# Patient Record
Sex: Female | Born: 1940 | Race: Black or African American | Hispanic: No | Marital: Single | Smoking: Former smoker
Health system: Southern US, Community
[De-identification: ages and names within clinical notes are randomized; demographics above are authoritative.]

## PROBLEM LIST (undated history)

## (undated) DIAGNOSIS — I340 Nonrheumatic mitral (valve) insufficiency: Secondary | ICD-10-CM

## (undated) DIAGNOSIS — D649 Anemia, unspecified: Secondary | ICD-10-CM

## (undated) DIAGNOSIS — I00 Rheumatic fever without heart involvement: Secondary | ICD-10-CM

## (undated) DIAGNOSIS — I099 Rheumatic heart disease, unspecified: Secondary | ICD-10-CM

## (undated) DIAGNOSIS — R001 Bradycardia, unspecified: Secondary | ICD-10-CM

## (undated) DIAGNOSIS — I10 Essential (primary) hypertension: Secondary | ICD-10-CM

## (undated) DIAGNOSIS — Z9289 Personal history of other medical treatment: Secondary | ICD-10-CM

## (undated) DIAGNOSIS — M069 Rheumatoid arthritis, unspecified: Secondary | ICD-10-CM

## (undated) DIAGNOSIS — I351 Nonrheumatic aortic (valve) insufficiency: Secondary | ICD-10-CM

## (undated) HISTORY — DX: Nonrheumatic mitral (valve) insufficiency: I34.0

## (undated) HISTORY — PX: CHOLECYSTECTOMY: SHX55

## (undated) HISTORY — DX: Personal history of other medical treatment: Z92.89

## (undated) HISTORY — DX: Essential (primary) hypertension: I10

## (undated) HISTORY — PX: TONSILLECTOMY: SUR1361

## (undated) HISTORY — PX: ABDOMINAL HYSTERECTOMY: SHX81

## (undated) HISTORY — DX: Nonrheumatic aortic (valve) insufficiency: I35.1

## (undated) HISTORY — PX: COLON SURGERY: SHX602

## (undated) HISTORY — PX: EXCISIONAL HEMORRHOIDECTOMY: SHX1541

---

## 1993-04-07 HISTORY — PX: HEMI-MICRODISCECTOMY LUMBAR LAMINECTOMY LEVEL 4: SHX5849

## 2008-11-05 DIAGNOSIS — Z9289 Personal history of other medical treatment: Secondary | ICD-10-CM

## 2008-11-05 HISTORY — DX: Personal history of other medical treatment: Z92.89

## 2009-06-01 ENCOUNTER — Emergency Department: Payer: Self-pay | Admitting: Emergency Medicine

## 2009-06-11 ENCOUNTER — Ambulatory Visit: Payer: Self-pay

## 2009-07-04 ENCOUNTER — Inpatient Hospital Stay: Payer: Self-pay | Admitting: Internal Medicine

## 2009-09-17 ENCOUNTER — Ambulatory Visit: Payer: Self-pay | Admitting: Family Medicine

## 2009-09-26 ENCOUNTER — Ambulatory Visit: Payer: Self-pay | Admitting: Family Medicine

## 2009-12-11 ENCOUNTER — Ambulatory Visit: Payer: Self-pay | Admitting: Specialist

## 2010-03-21 ENCOUNTER — Ambulatory Visit: Payer: Self-pay | Admitting: Family Medicine

## 2010-05-18 ENCOUNTER — Emergency Department: Payer: Self-pay | Admitting: Internal Medicine

## 2010-05-27 ENCOUNTER — Emergency Department: Payer: Self-pay | Admitting: Emergency Medicine

## 2010-06-14 ENCOUNTER — Ambulatory Visit: Payer: Self-pay | Admitting: Specialist

## 2010-09-27 ENCOUNTER — Ambulatory Visit: Payer: Self-pay | Admitting: Family Medicine

## 2010-12-25 ENCOUNTER — Ambulatory Visit: Payer: Self-pay | Admitting: Specialist

## 2011-04-16 DIAGNOSIS — H903 Sensorineural hearing loss, bilateral: Secondary | ICD-10-CM | POA: Diagnosis not present

## 2011-04-17 ENCOUNTER — Ambulatory Visit: Payer: Self-pay | Admitting: Family Medicine

## 2011-04-17 DIAGNOSIS — N959 Unspecified menopausal and perimenopausal disorder: Secondary | ICD-10-CM | POA: Diagnosis not present

## 2011-04-17 DIAGNOSIS — N951 Menopausal and female climacteric states: Secondary | ICD-10-CM | POA: Diagnosis not present

## 2011-05-06 DIAGNOSIS — I10 Essential (primary) hypertension: Secondary | ICD-10-CM | POA: Diagnosis not present

## 2011-05-06 DIAGNOSIS — H35039 Hypertensive retinopathy, unspecified eye: Secondary | ICD-10-CM | POA: Diagnosis not present

## 2011-06-06 ENCOUNTER — Ambulatory Visit: Payer: Self-pay | Admitting: Specialist

## 2011-06-06 DIAGNOSIS — J984 Other disorders of lung: Secondary | ICD-10-CM | POA: Diagnosis not present

## 2011-06-06 DIAGNOSIS — R918 Other nonspecific abnormal finding of lung field: Secondary | ICD-10-CM | POA: Diagnosis not present

## 2011-06-13 DIAGNOSIS — R911 Solitary pulmonary nodule: Secondary | ICD-10-CM | POA: Diagnosis not present

## 2011-06-16 DIAGNOSIS — I10 Essential (primary) hypertension: Secondary | ICD-10-CM | POA: Diagnosis not present

## 2011-07-15 DIAGNOSIS — L089 Local infection of the skin and subcutaneous tissue, unspecified: Secondary | ICD-10-CM | POA: Diagnosis not present

## 2011-08-19 DIAGNOSIS — M722 Plantar fascial fibromatosis: Secondary | ICD-10-CM | POA: Diagnosis not present

## 2011-08-19 DIAGNOSIS — B351 Tinea unguium: Secondary | ICD-10-CM | POA: Diagnosis not present

## 2011-08-19 DIAGNOSIS — M79609 Pain in unspecified limb: Secondary | ICD-10-CM | POA: Diagnosis not present

## 2011-08-20 DIAGNOSIS — H04129 Dry eye syndrome of unspecified lacrimal gland: Secondary | ICD-10-CM | POA: Diagnosis not present

## 2011-09-16 DIAGNOSIS — I10 Essential (primary) hypertension: Secondary | ICD-10-CM | POA: Diagnosis not present

## 2011-09-29 DIAGNOSIS — E119 Type 2 diabetes mellitus without complications: Secondary | ICD-10-CM | POA: Diagnosis not present

## 2011-09-30 ENCOUNTER — Ambulatory Visit: Payer: Self-pay | Admitting: Family Medicine

## 2011-09-30 DIAGNOSIS — Z1231 Encounter for screening mammogram for malignant neoplasm of breast: Secondary | ICD-10-CM | POA: Diagnosis not present

## 2011-10-14 DIAGNOSIS — M6281 Muscle weakness (generalized): Secondary | ICD-10-CM | POA: Diagnosis not present

## 2011-10-14 DIAGNOSIS — R279 Unspecified lack of coordination: Secondary | ICD-10-CM | POA: Diagnosis not present

## 2011-10-14 DIAGNOSIS — M546 Pain in thoracic spine: Secondary | ICD-10-CM | POA: Diagnosis not present

## 2011-10-14 DIAGNOSIS — M545 Low back pain: Secondary | ICD-10-CM | POA: Diagnosis not present

## 2011-10-14 DIAGNOSIS — M48061 Spinal stenosis, lumbar region without neurogenic claudication: Secondary | ICD-10-CM | POA: Diagnosis not present

## 2011-10-16 DIAGNOSIS — M545 Low back pain: Secondary | ICD-10-CM | POA: Diagnosis not present

## 2011-10-16 DIAGNOSIS — M546 Pain in thoracic spine: Secondary | ICD-10-CM | POA: Diagnosis not present

## 2011-10-16 DIAGNOSIS — M48061 Spinal stenosis, lumbar region without neurogenic claudication: Secondary | ICD-10-CM | POA: Diagnosis not present

## 2011-10-16 DIAGNOSIS — M6281 Muscle weakness (generalized): Secondary | ICD-10-CM | POA: Diagnosis not present

## 2011-10-16 DIAGNOSIS — R279 Unspecified lack of coordination: Secondary | ICD-10-CM | POA: Diagnosis not present

## 2011-10-17 DIAGNOSIS — M545 Low back pain: Secondary | ICD-10-CM | POA: Diagnosis not present

## 2011-10-17 DIAGNOSIS — M6281 Muscle weakness (generalized): Secondary | ICD-10-CM | POA: Diagnosis not present

## 2011-10-17 DIAGNOSIS — R279 Unspecified lack of coordination: Secondary | ICD-10-CM | POA: Diagnosis not present

## 2011-10-17 DIAGNOSIS — M48061 Spinal stenosis, lumbar region without neurogenic claudication: Secondary | ICD-10-CM | POA: Diagnosis not present

## 2011-10-17 DIAGNOSIS — M546 Pain in thoracic spine: Secondary | ICD-10-CM | POA: Diagnosis not present

## 2011-10-21 DIAGNOSIS — R279 Unspecified lack of coordination: Secondary | ICD-10-CM | POA: Diagnosis not present

## 2011-10-21 DIAGNOSIS — M546 Pain in thoracic spine: Secondary | ICD-10-CM | POA: Diagnosis not present

## 2011-10-21 DIAGNOSIS — M6281 Muscle weakness (generalized): Secondary | ICD-10-CM | POA: Diagnosis not present

## 2011-10-21 DIAGNOSIS — M545 Low back pain: Secondary | ICD-10-CM | POA: Diagnosis not present

## 2011-10-21 DIAGNOSIS — M48061 Spinal stenosis, lumbar region without neurogenic claudication: Secondary | ICD-10-CM | POA: Diagnosis not present

## 2011-10-22 DIAGNOSIS — M546 Pain in thoracic spine: Secondary | ICD-10-CM | POA: Diagnosis not present

## 2011-10-22 DIAGNOSIS — M6281 Muscle weakness (generalized): Secondary | ICD-10-CM | POA: Diagnosis not present

## 2011-10-22 DIAGNOSIS — M545 Low back pain: Secondary | ICD-10-CM | POA: Diagnosis not present

## 2011-10-22 DIAGNOSIS — M48061 Spinal stenosis, lumbar region without neurogenic claudication: Secondary | ICD-10-CM | POA: Diagnosis not present

## 2011-10-22 DIAGNOSIS — R279 Unspecified lack of coordination: Secondary | ICD-10-CM | POA: Diagnosis not present

## 2011-10-23 DIAGNOSIS — M6281 Muscle weakness (generalized): Secondary | ICD-10-CM | POA: Diagnosis not present

## 2011-10-23 DIAGNOSIS — M48061 Spinal stenosis, lumbar region without neurogenic claudication: Secondary | ICD-10-CM | POA: Diagnosis not present

## 2011-10-23 DIAGNOSIS — R279 Unspecified lack of coordination: Secondary | ICD-10-CM | POA: Diagnosis not present

## 2011-10-23 DIAGNOSIS — M546 Pain in thoracic spine: Secondary | ICD-10-CM | POA: Diagnosis not present

## 2011-10-23 DIAGNOSIS — M545 Low back pain: Secondary | ICD-10-CM | POA: Diagnosis not present

## 2011-10-27 DIAGNOSIS — R279 Unspecified lack of coordination: Secondary | ICD-10-CM | POA: Diagnosis not present

## 2011-10-27 DIAGNOSIS — M545 Low back pain: Secondary | ICD-10-CM | POA: Diagnosis not present

## 2011-10-27 DIAGNOSIS — M546 Pain in thoracic spine: Secondary | ICD-10-CM | POA: Diagnosis not present

## 2011-10-27 DIAGNOSIS — M6281 Muscle weakness (generalized): Secondary | ICD-10-CM | POA: Diagnosis not present

## 2011-10-27 DIAGNOSIS — M48061 Spinal stenosis, lumbar region without neurogenic claudication: Secondary | ICD-10-CM | POA: Diagnosis not present

## 2011-10-29 DIAGNOSIS — M546 Pain in thoracic spine: Secondary | ICD-10-CM | POA: Diagnosis not present

## 2011-10-29 DIAGNOSIS — M545 Low back pain: Secondary | ICD-10-CM | POA: Diagnosis not present

## 2011-10-29 DIAGNOSIS — M48061 Spinal stenosis, lumbar region without neurogenic claudication: Secondary | ICD-10-CM | POA: Diagnosis not present

## 2011-10-29 DIAGNOSIS — R279 Unspecified lack of coordination: Secondary | ICD-10-CM | POA: Diagnosis not present

## 2011-10-29 DIAGNOSIS — M6281 Muscle weakness (generalized): Secondary | ICD-10-CM | POA: Diagnosis not present

## 2011-11-03 DIAGNOSIS — M48061 Spinal stenosis, lumbar region without neurogenic claudication: Secondary | ICD-10-CM | POA: Diagnosis not present

## 2011-11-03 DIAGNOSIS — M6281 Muscle weakness (generalized): Secondary | ICD-10-CM | POA: Diagnosis not present

## 2011-11-03 DIAGNOSIS — M546 Pain in thoracic spine: Secondary | ICD-10-CM | POA: Diagnosis not present

## 2011-11-03 DIAGNOSIS — M545 Low back pain: Secondary | ICD-10-CM | POA: Diagnosis not present

## 2011-11-03 DIAGNOSIS — R279 Unspecified lack of coordination: Secondary | ICD-10-CM | POA: Diagnosis not present

## 2011-11-04 DIAGNOSIS — M48061 Spinal stenosis, lumbar region without neurogenic claudication: Secondary | ICD-10-CM | POA: Diagnosis not present

## 2011-11-04 DIAGNOSIS — M545 Low back pain: Secondary | ICD-10-CM | POA: Diagnosis not present

## 2011-11-04 DIAGNOSIS — M546 Pain in thoracic spine: Secondary | ICD-10-CM | POA: Diagnosis not present

## 2011-11-04 DIAGNOSIS — M6281 Muscle weakness (generalized): Secondary | ICD-10-CM | POA: Diagnosis not present

## 2011-11-04 DIAGNOSIS — R279 Unspecified lack of coordination: Secondary | ICD-10-CM | POA: Diagnosis not present

## 2011-11-06 HISTORY — PX: TRANSTHORACIC ECHOCARDIOGRAM: SHX275

## 2011-11-14 DIAGNOSIS — M549 Dorsalgia, unspecified: Secondary | ICD-10-CM | POA: Diagnosis not present

## 2011-11-24 DIAGNOSIS — R279 Unspecified lack of coordination: Secondary | ICD-10-CM | POA: Diagnosis not present

## 2011-11-24 DIAGNOSIS — M6281 Muscle weakness (generalized): Secondary | ICD-10-CM | POA: Diagnosis not present

## 2011-11-24 DIAGNOSIS — M48061 Spinal stenosis, lumbar region without neurogenic claudication: Secondary | ICD-10-CM | POA: Diagnosis not present

## 2011-11-24 DIAGNOSIS — M546 Pain in thoracic spine: Secondary | ICD-10-CM | POA: Diagnosis not present

## 2011-11-24 DIAGNOSIS — M545 Low back pain: Secondary | ICD-10-CM | POA: Diagnosis not present

## 2011-11-26 DIAGNOSIS — I059 Rheumatic mitral valve disease, unspecified: Secondary | ICD-10-CM | POA: Diagnosis not present

## 2011-11-27 DIAGNOSIS — M545 Low back pain: Secondary | ICD-10-CM | POA: Diagnosis not present

## 2011-11-27 DIAGNOSIS — M6281 Muscle weakness (generalized): Secondary | ICD-10-CM | POA: Diagnosis not present

## 2011-11-27 DIAGNOSIS — R279 Unspecified lack of coordination: Secondary | ICD-10-CM | POA: Diagnosis not present

## 2011-11-27 DIAGNOSIS — M48061 Spinal stenosis, lumbar region without neurogenic claudication: Secondary | ICD-10-CM | POA: Diagnosis not present

## 2011-11-27 DIAGNOSIS — M546 Pain in thoracic spine: Secondary | ICD-10-CM | POA: Diagnosis not present

## 2011-12-03 DIAGNOSIS — M6281 Muscle weakness (generalized): Secondary | ICD-10-CM | POA: Diagnosis not present

## 2011-12-03 DIAGNOSIS — M546 Pain in thoracic spine: Secondary | ICD-10-CM | POA: Diagnosis not present

## 2011-12-03 DIAGNOSIS — M545 Low back pain: Secondary | ICD-10-CM | POA: Diagnosis not present

## 2011-12-03 DIAGNOSIS — R279 Unspecified lack of coordination: Secondary | ICD-10-CM | POA: Diagnosis not present

## 2011-12-03 DIAGNOSIS — M48061 Spinal stenosis, lumbar region without neurogenic claudication: Secondary | ICD-10-CM | POA: Diagnosis not present

## 2011-12-05 DIAGNOSIS — I059 Rheumatic mitral valve disease, unspecified: Secondary | ICD-10-CM | POA: Diagnosis not present

## 2011-12-05 DIAGNOSIS — I10 Essential (primary) hypertension: Secondary | ICD-10-CM | POA: Diagnosis not present

## 2011-12-15 DIAGNOSIS — M549 Dorsalgia, unspecified: Secondary | ICD-10-CM | POA: Diagnosis not present

## 2011-12-22 ENCOUNTER — Emergency Department: Payer: Self-pay | Admitting: Emergency Medicine

## 2011-12-22 DIAGNOSIS — S139XXA Sprain of joints and ligaments of unspecified parts of neck, initial encounter: Secondary | ICD-10-CM | POA: Diagnosis not present

## 2011-12-22 DIAGNOSIS — M542 Cervicalgia: Secondary | ICD-10-CM | POA: Diagnosis not present

## 2011-12-22 DIAGNOSIS — Z79899 Other long term (current) drug therapy: Secondary | ICD-10-CM | POA: Diagnosis not present

## 2011-12-22 DIAGNOSIS — I10 Essential (primary) hypertension: Secondary | ICD-10-CM | POA: Diagnosis not present

## 2011-12-30 DIAGNOSIS — R293 Abnormal posture: Secondary | ICD-10-CM | POA: Diagnosis not present

## 2011-12-30 DIAGNOSIS — R279 Unspecified lack of coordination: Secondary | ICD-10-CM | POA: Diagnosis not present

## 2011-12-30 DIAGNOSIS — M62838 Other muscle spasm: Secondary | ICD-10-CM | POA: Diagnosis not present

## 2011-12-30 DIAGNOSIS — M542 Cervicalgia: Secondary | ICD-10-CM | POA: Diagnosis not present

## 2012-01-01 DIAGNOSIS — R293 Abnormal posture: Secondary | ICD-10-CM | POA: Diagnosis not present

## 2012-01-01 DIAGNOSIS — M542 Cervicalgia: Secondary | ICD-10-CM | POA: Diagnosis not present

## 2012-01-01 DIAGNOSIS — M62838 Other muscle spasm: Secondary | ICD-10-CM | POA: Diagnosis not present

## 2012-01-01 DIAGNOSIS — R279 Unspecified lack of coordination: Secondary | ICD-10-CM | POA: Diagnosis not present

## 2012-01-03 DIAGNOSIS — Z23 Encounter for immunization: Secondary | ICD-10-CM | POA: Diagnosis not present

## 2012-01-06 DIAGNOSIS — M542 Cervicalgia: Secondary | ICD-10-CM | POA: Diagnosis not present

## 2012-01-06 DIAGNOSIS — R293 Abnormal posture: Secondary | ICD-10-CM | POA: Diagnosis not present

## 2012-01-06 DIAGNOSIS — R279 Unspecified lack of coordination: Secondary | ICD-10-CM | POA: Diagnosis not present

## 2012-01-06 DIAGNOSIS — M62838 Other muscle spasm: Secondary | ICD-10-CM | POA: Diagnosis not present

## 2012-01-08 DIAGNOSIS — M62838 Other muscle spasm: Secondary | ICD-10-CM | POA: Diagnosis not present

## 2012-01-08 DIAGNOSIS — R293 Abnormal posture: Secondary | ICD-10-CM | POA: Diagnosis not present

## 2012-01-08 DIAGNOSIS — M542 Cervicalgia: Secondary | ICD-10-CM | POA: Diagnosis not present

## 2012-01-08 DIAGNOSIS — R279 Unspecified lack of coordination: Secondary | ICD-10-CM | POA: Diagnosis not present

## 2012-01-12 DIAGNOSIS — M542 Cervicalgia: Secondary | ICD-10-CM | POA: Diagnosis not present

## 2012-01-12 DIAGNOSIS — M62838 Other muscle spasm: Secondary | ICD-10-CM | POA: Diagnosis not present

## 2012-01-12 DIAGNOSIS — R293 Abnormal posture: Secondary | ICD-10-CM | POA: Diagnosis not present

## 2012-01-12 DIAGNOSIS — R279 Unspecified lack of coordination: Secondary | ICD-10-CM | POA: Diagnosis not present

## 2012-01-21 DIAGNOSIS — M542 Cervicalgia: Secondary | ICD-10-CM | POA: Diagnosis not present

## 2012-01-21 DIAGNOSIS — R293 Abnormal posture: Secondary | ICD-10-CM | POA: Diagnosis not present

## 2012-01-21 DIAGNOSIS — M62838 Other muscle spasm: Secondary | ICD-10-CM | POA: Diagnosis not present

## 2012-01-21 DIAGNOSIS — R279 Unspecified lack of coordination: Secondary | ICD-10-CM | POA: Diagnosis not present

## 2012-02-12 DIAGNOSIS — J984 Other disorders of lung: Secondary | ICD-10-CM | POA: Diagnosis not present

## 2012-02-12 DIAGNOSIS — R05 Cough: Secondary | ICD-10-CM | POA: Diagnosis not present

## 2012-03-15 DIAGNOSIS — I10 Essential (primary) hypertension: Secondary | ICD-10-CM | POA: Diagnosis not present

## 2012-03-15 DIAGNOSIS — E7800 Pure hypercholesterolemia, unspecified: Secondary | ICD-10-CM | POA: Diagnosis not present

## 2012-03-31 ENCOUNTER — Emergency Department: Payer: Self-pay | Admitting: Emergency Medicine

## 2012-03-31 DIAGNOSIS — S90129A Contusion of unspecified lesser toe(s) without damage to nail, initial encounter: Secondary | ICD-10-CM | POA: Diagnosis not present

## 2012-03-31 DIAGNOSIS — S92309A Fracture of unspecified metatarsal bone(s), unspecified foot, initial encounter for closed fracture: Secondary | ICD-10-CM | POA: Diagnosis not present

## 2012-03-31 DIAGNOSIS — M79609 Pain in unspecified limb: Secondary | ICD-10-CM | POA: Diagnosis not present

## 2012-04-02 DIAGNOSIS — S92309A Fracture of unspecified metatarsal bone(s), unspecified foot, initial encounter for closed fracture: Secondary | ICD-10-CM | POA: Diagnosis not present

## 2012-04-27 DIAGNOSIS — S92309A Fracture of unspecified metatarsal bone(s), unspecified foot, initial encounter for closed fracture: Secondary | ICD-10-CM | POA: Diagnosis not present

## 2012-05-18 DIAGNOSIS — S92309A Fracture of unspecified metatarsal bone(s), unspecified foot, initial encounter for closed fracture: Secondary | ICD-10-CM | POA: Diagnosis not present

## 2012-05-25 DIAGNOSIS — I10 Essential (primary) hypertension: Secondary | ICD-10-CM | POA: Diagnosis not present

## 2012-05-25 DIAGNOSIS — I060 Rheumatic aortic stenosis: Secondary | ICD-10-CM | POA: Diagnosis not present

## 2012-07-07 DIAGNOSIS — I10 Essential (primary) hypertension: Secondary | ICD-10-CM | POA: Diagnosis not present

## 2012-08-18 DIAGNOSIS — R970 Elevated carcinoembryonic antigen [CEA]: Secondary | ICD-10-CM | POA: Diagnosis not present

## 2012-08-18 DIAGNOSIS — I10 Essential (primary) hypertension: Secondary | ICD-10-CM | POA: Diagnosis not present

## 2012-08-18 DIAGNOSIS — K625 Hemorrhage of anus and rectum: Secondary | ICD-10-CM | POA: Diagnosis not present

## 2012-08-20 ENCOUNTER — Other Ambulatory Visit: Payer: Self-pay | Admitting: Family Medicine

## 2012-08-20 DIAGNOSIS — R1031 Right lower quadrant pain: Secondary | ICD-10-CM

## 2012-08-20 DIAGNOSIS — K625 Hemorrhage of anus and rectum: Secondary | ICD-10-CM

## 2012-08-25 ENCOUNTER — Ambulatory Visit
Admission: RE | Admit: 2012-08-25 | Discharge: 2012-08-25 | Disposition: A | Discharge status: Home / Self Care | Payer: Medicare Other | Source: Ambulatory Visit | Attending: Family Medicine | Admitting: Family Medicine

## 2012-08-25 DIAGNOSIS — R1031 Right lower quadrant pain: Secondary | ICD-10-CM

## 2012-08-25 DIAGNOSIS — K625 Hemorrhage of anus and rectum: Secondary | ICD-10-CM

## 2012-08-25 DIAGNOSIS — K389 Disease of appendix, unspecified: Secondary | ICD-10-CM | POA: Diagnosis not present

## 2012-08-25 MED ORDER — IOHEXOL 300 MG/ML  SOLN
100.0000 mL | Freq: Once | INTRAMUSCULAR | Status: AC | PRN
  Administered 2012-08-25: 100 mL via INTRAVENOUS

## 2012-09-02 ENCOUNTER — Encounter (INDEPENDENT_AMBULATORY_CARE_PROVIDER_SITE_OTHER): Payer: Self-pay

## 2012-09-07 ENCOUNTER — Encounter (INDEPENDENT_AMBULATORY_CARE_PROVIDER_SITE_OTHER): Payer: Self-pay | Admitting: General Surgery

## 2012-09-07 ENCOUNTER — Ambulatory Visit (INDEPENDENT_AMBULATORY_CARE_PROVIDER_SITE_OTHER): Payer: Medicare Other | Admitting: General Surgery

## 2012-09-07 VITALS — BP 130/70 | HR 68 | Temp 97.9°F | Resp 18 | Ht 65.0 in | Wt 163.0 lb

## 2012-09-07 DIAGNOSIS — R1903 Right lower quadrant abdominal swelling, mass and lump: Secondary | ICD-10-CM | POA: Insufficient documentation

## 2012-09-07 NOTE — Progress Notes (Signed)
Subjective:     Patient ID: Rhonda Spencer, female   DOB: Nov 16, 1940, 72 y.o.   MRN: [id number]  HPI The patient comes in complaining of rectal bleeding. This happened approximately 2-3 weeks ago and she has not had an episode since that time. Examination at that time did not reveal apparently hemorrhoids however she has had hemorrhoidal disease in the past. She was noted also to have some right lower quadrant abdominal tenderness and therefore a CT scan of the this demonstrated a possible mass in her appendix however the patient reports to me that she had some type of surgery for ileitis and this possibility that she may have had a right colectomy in the past. If that is the case then she should not have her appendix.  Review of Systems Rectal bleeding only. She's had no pain except for the tenderness in the right lower quadrant.    Objective:   Physical Exam    multiple abdominal scars including a right upper quadrant transverse incision from her previous cholecystectomy. She has a midline wound scar of left lower quadrant scar and multiple laparoscopic scars throughout her abdomen. She is mildly tender in the right lower quadrant with good bowel sounds. It is no peritonitis.  On rectal examination the patient has a loose sphincter and she admits to having occasional incontinence. She has hemorrhoidal disease primarily at the 1:00 position but these hemorrhoids and very friable and did not bleed during the examination today.  There is no evidence of tumor Assessment:     Rectal bleeding of unknown etiology.  Right lower quadrant mass noted on CT scan, questionable mass in the appendix although the patient may have had her appendix removed from a previous procedure done in [place].      Plan:     The patient needs to get her records from [place] concerning her previous procedure. I suspect she may have had a colectomy before and is possible that she had a right colectomy in which case her  appendix should not be present.  She is also due to get a colonoscopy in the next several days. This may give [place] more formation concerning etiology of the patient's bleeding. Based on my current examination is a possibility that her hemorrhoids to cause the bleeding couple weeks ago. I do not think that the mass in CT scan in the right lower quadrant account for the patient's bleeding and possibly have something to do with her right lower quadrant abdominal discomfort. We'll see her back in 2-3 weeks for reevaluation after her colonoscopy.

## 2012-09-09 ENCOUNTER — Ambulatory Visit (INDEPENDENT_AMBULATORY_CARE_PROVIDER_SITE_OTHER): Payer: Medicare Other | Admitting: Internal Medicine

## 2012-09-09 ENCOUNTER — Encounter: Payer: Self-pay | Admitting: Internal Medicine

## 2012-09-09 VITALS — BP 140/70 | HR 67 | Ht 67.0 in | Wt 162.0 lb

## 2012-09-09 DIAGNOSIS — R933 Abnormal findings on diagnostic imaging of other parts of digestive tract: Secondary | ICD-10-CM | POA: Diagnosis not present

## 2012-09-09 DIAGNOSIS — I099 Rheumatic heart disease, unspecified: Secondary | ICD-10-CM | POA: Diagnosis not present

## 2012-09-09 DIAGNOSIS — R1031 Right lower quadrant pain: Secondary | ICD-10-CM | POA: Diagnosis not present

## 2012-09-09 DIAGNOSIS — I359 Nonrheumatic aortic valve disorder, unspecified: Secondary | ICD-10-CM

## 2012-09-09 DIAGNOSIS — Z1211 Encounter for screening for malignant neoplasm of colon: Secondary | ICD-10-CM | POA: Diagnosis not present

## 2012-09-09 DIAGNOSIS — I10 Essential (primary) hypertension: Secondary | ICD-10-CM

## 2012-09-09 DIAGNOSIS — Z01810 Encounter for preprocedural cardiovascular examination: Secondary | ICD-10-CM | POA: Diagnosis not present

## 2012-09-09 DIAGNOSIS — I351 Nonrheumatic aortic (valve) insufficiency: Secondary | ICD-10-CM | POA: Insufficient documentation

## 2012-09-09 DIAGNOSIS — I059 Rheumatic mitral valve disease, unspecified: Secondary | ICD-10-CM

## 2012-09-09 DIAGNOSIS — K625 Hemorrhage of anus and rectum: Secondary | ICD-10-CM | POA: Diagnosis not present

## 2012-09-09 DIAGNOSIS — I340 Nonrheumatic mitral (valve) insufficiency: Secondary | ICD-10-CM | POA: Insufficient documentation

## 2012-09-09 NOTE — Progress Notes (Signed)
OFFICE NOTE  Chief Complaint:  Preoperative clearance  Primary Care Physician: Evlyn Courier, MD  HPI:  Rhonda Spencer is a pleasant 72 year old with history of rheumatoid heart disease and mitral insufficiency as well as aortic insufficiency. Recently, she had an echocardiogram, in August 2013, which showed an EF of greater than 55% with stable LV internal diameter. However, she did have mild to moderate left atrial enlargement, moderate to severe MR and mild to moderate TR, as well as mild to moderate AI. This does seem to have worsened somewhat since her last echo a year prior. In the past 6 months, however, she has had no significant change in her symptomatology. She does get mildly short of breath with marked exertion. However, this is not significantly changed. She is still active without limitations. She was also notably more hypertensive at the time. Weight has been stable, if not perhaps a few pounds lighter. She reports no crescendo shortness of breath symptoms. She also denies any angina, especially with exertion. She is able to walk up a flight of stairs without having to stop for shortness of breath. I am seeing her today for cardiac clearance prior to a colonoscopy.  This is being performed due to recent blood in the stool and right lower quadrant abdominal pain.  PMHx:  Past Medical History  Diagnosis Date  . Arthritis   . Heart murmur   . Hypertension     Past Surgical History  Procedure Laterality Date  . Cholecystectomy    . Excisional hemorrhoidectomy    . Abdominal hysterectomy    . Colon surgery    . Hemi-microdiscectomy lumbar laminectomy level 4      1995    FAMHx:  Family History  Problem Relation Age of Onset  . Cancer Maternal Uncle     bone cancer    SOCHx:   reports that she has never smoked. She does not have any smokeless tobacco history on file. She reports that she does not drink alcohol or use illicit drugs.  ALLERGIES:  Allergies  Allergen  Reactions  . Tarka (Trandolapril-Verapamil Hcl Er) Swelling    Pt reports allergy to any cardiac medication ending in "-il"    ROS: A comprehensive review of systems was negative except for: Gastrointestinal: positive for Right lower cauda pain, hematochezia  HOME MEDS: Current Outpatient Prescriptions  Medication Sig Dispense Refill  . aspirin 81 MG tablet Take 81 mg by mouth daily.      . calcium-vitamin D (OSCAL WITH D) 500-200 MG-UNIT per tablet Take 1 tablet by mouth daily.      . famotidine (PEPCID) 20 MG tablet Take 20 mg by mouth 2 (two) times daily.      . felodipine (PLENDIL) 5 MG 24 hr tablet Take 5 mg by mouth daily.      . hydrochlorothiazide (HYDRODIURIL) 25 MG tablet Take 25 mg by mouth daily.      [name] lidocaine (LIDODERM) 5 % Place 1 patch onto the skin daily. Remove & Discard patch within 12 hours or as directed by MD      . traMADol (ULTRAM) 50 MG tablet Take 50 mg by mouth every 4 (four) hours.       No current facility-administered medications for this visit.    LABS/IMAGING: No results found for this or any previous visit (from the past 48 hour(s)). No results found.  VITALS: BP 140/70  Pulse 67  Ht 5\' 7"  (1.702 m)  Wt 162 lb (73.483 kg)  BMI 25.37 kg/m2  EXAM: General appearance: alert and no distress Neck: no adenopathy, no carotid bruit, no JVD, supple, symmetrical, trachea midline and thyroid not enlarged, symmetric, no tenderness/mass/nodules Lungs: clear to auscultation bilaterally Heart: regular rate and rhythm, systolic murmur: late systolic 3/6, blowing at apex and diastolic murmur: holodiastolic 3/6, low pitch at 2nd left intercostal space Abdomen: soft, non-tender; bowel sounds normal; no masses,  no organomegaly Extremities: extremities normal, atraumatic, no cyanosis or edema Pulses: 2+ and symmetric Skin: Skin color, texture, turgor normal. No rashes or lesions Neurologic: Grossly normal  EKG: Normal sinus rhythm at  67  ASSESSMENT: 1. Moderate risk for a low to moderate risk procedure 2. Rheumatic heart disease with mild to moderate aortic insufficiency and moderate to severe mitral regurgitation 3. Hypertension 4. LVH by voltage criteria  PLAN: 1.   Rhonda Spencer is doing very well from a cardiovascular standpoint. Her valvular heart disease appears to be stable without new increases in her symptomatology.  A colonoscopy is a low to moderate risk procedure under moderate sedation I think she is at very little risk for that. Would recommend holding aspirin 7 days prior to the procedure and restarting after work. If she were laparoscopic or open abdominal surgery, under general anesthesia, I would be more concerned about her valvular heart risks.  It does not appear to be the case at this time. Please hesitate to contact me again if further questions about her risk.  Chrystie Nose, MD, [facility] Attending Cardiologist [facility]  Anil Havard C 09/09/2012, 3:52 PM

## 2012-09-09 NOTE — Patient Instructions (Signed)
Your physician recommends that you schedule a follow-up appointment in 6 months.   Clearance was faxed to Dr. Loreta Ave

## 2012-09-17 DIAGNOSIS — K625 Hemorrhage of anus and rectum: Secondary | ICD-10-CM | POA: Diagnosis not present

## 2012-09-17 DIAGNOSIS — I10 Essential (primary) hypertension: Secondary | ICD-10-CM | POA: Diagnosis not present

## 2012-10-03 IMAGING — CR CERVICAL SPINE - COMPLETE 4+ VIEW
1 series · 9 of 9 positions shown · non-contrast
Comparison: none

REASON FOR EXAM: neck pain
COMMENTS:

[Series 1: w cervical spine lat · 0.14mm/px · 9 of 9 slices shown]
[im 1/9]
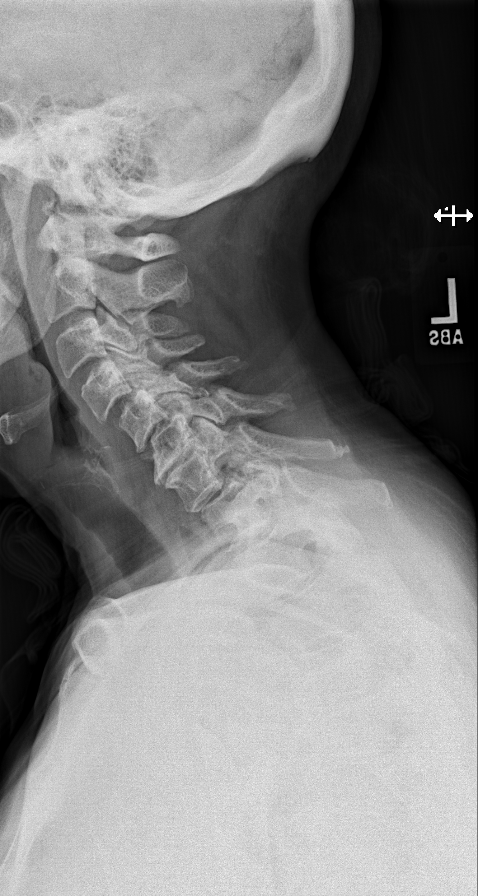
[im 2/9]
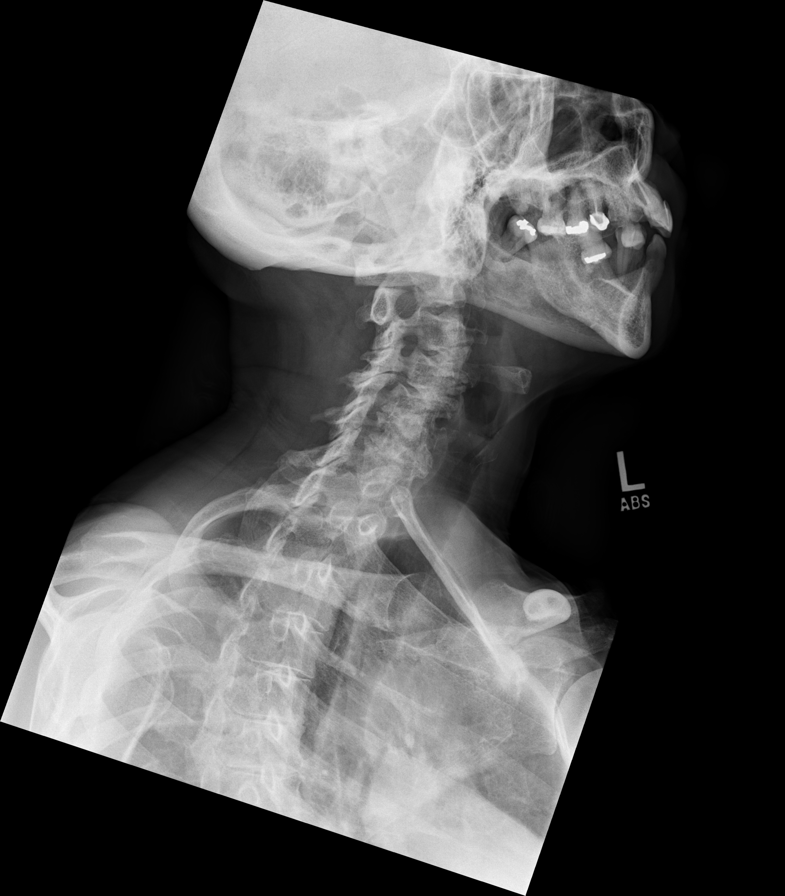
[im 3/9]
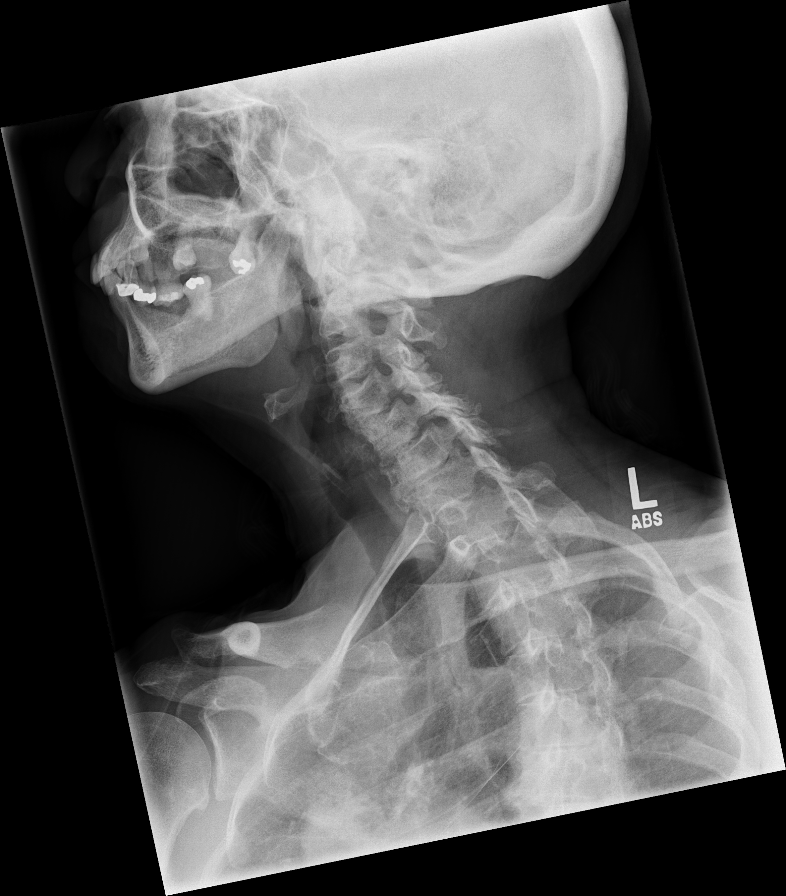
[im 4/9]
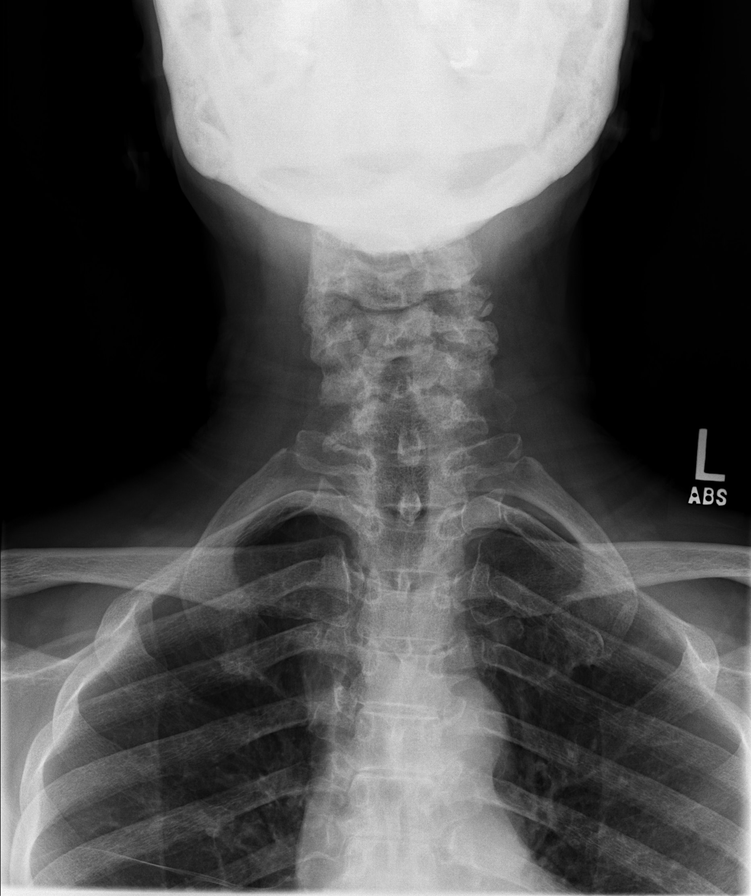
[im 5/9]
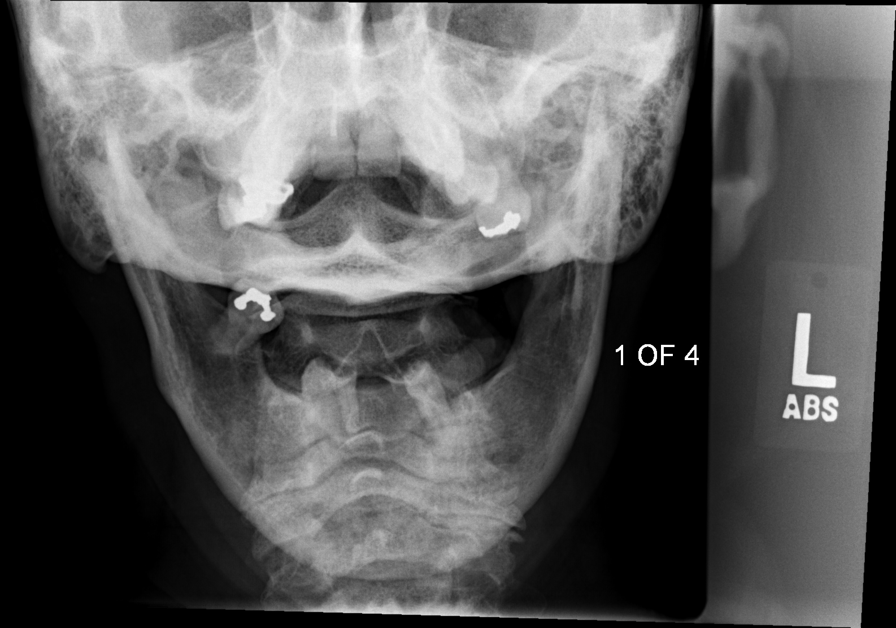
[im 6/9]
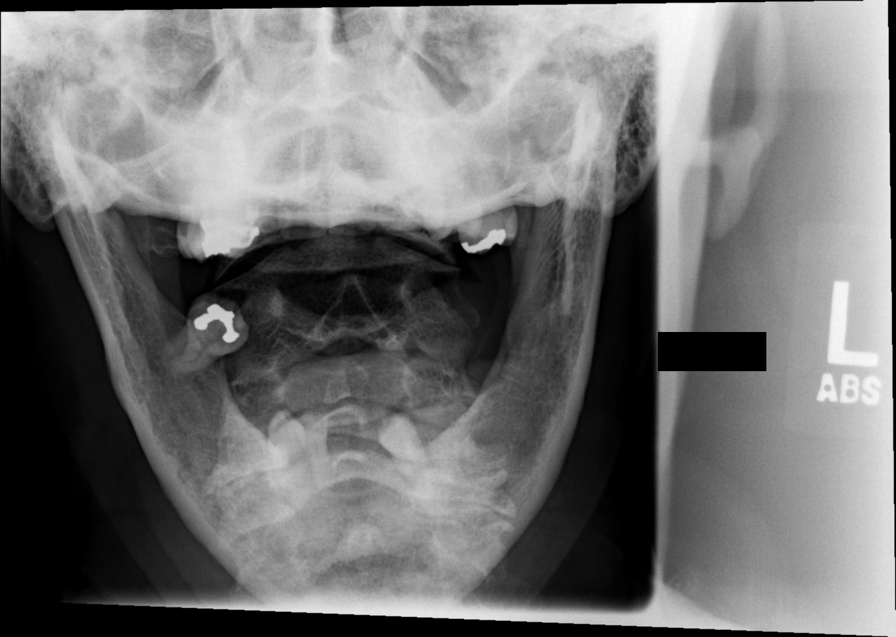
[im 7/9]
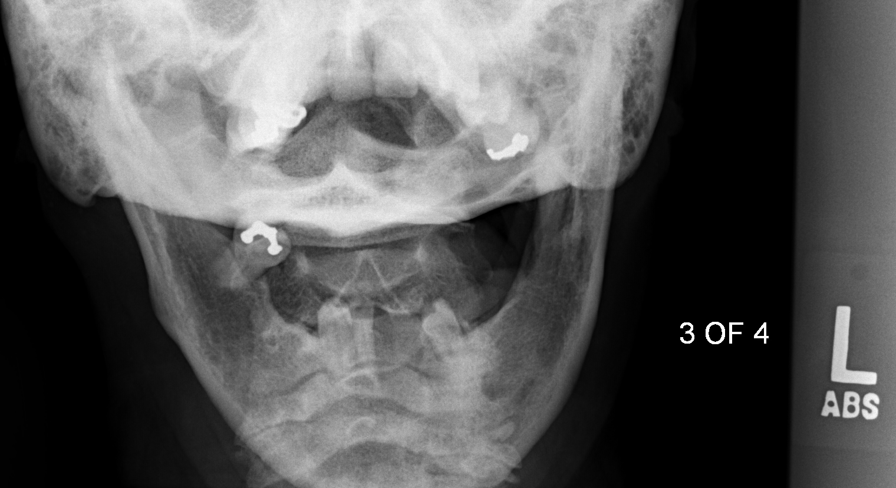
[im 8/9]
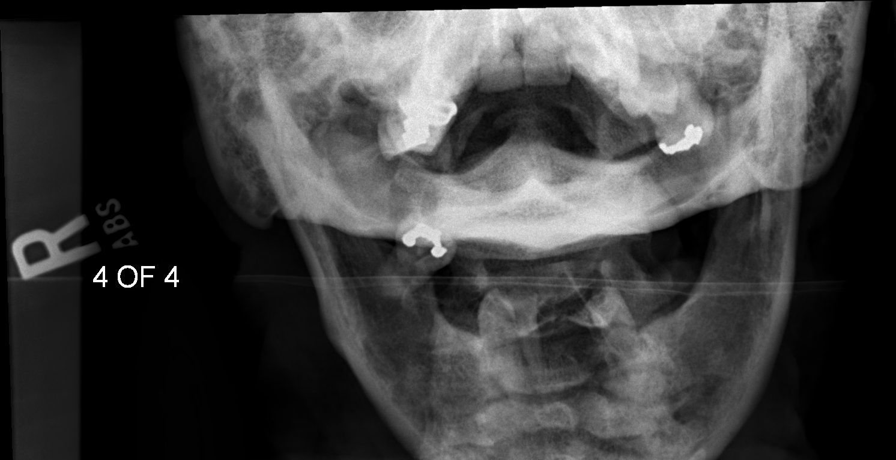
[im 9/9]
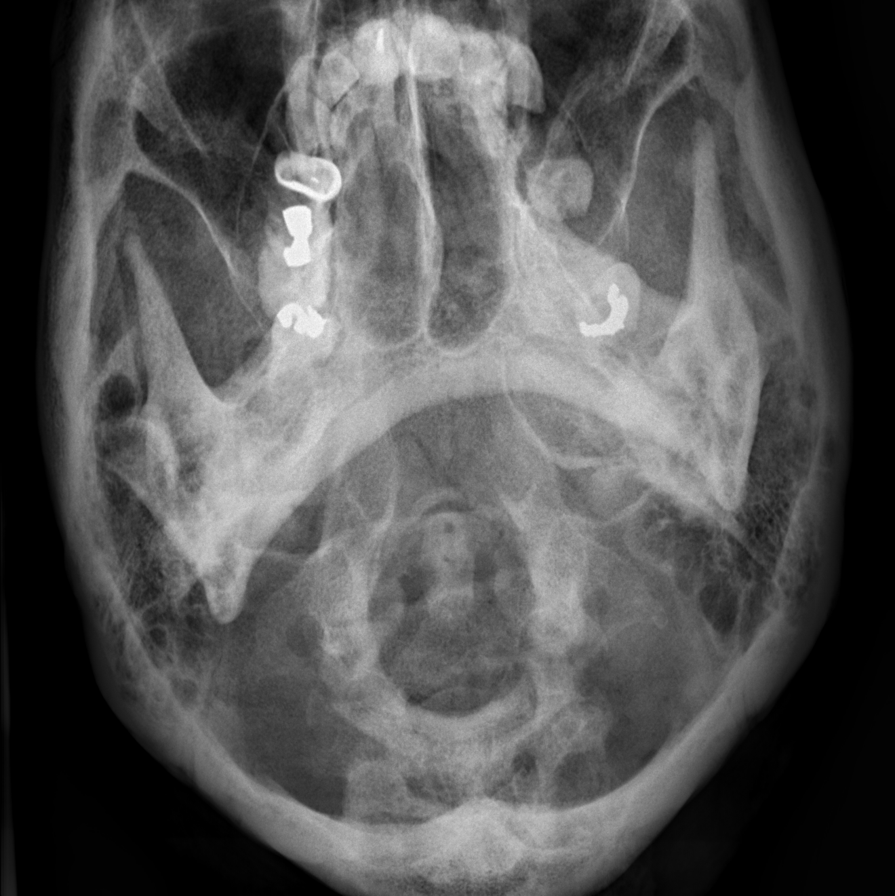

[9 of 9 positions shown; findings below may reference images not displayed]

PROCEDURE:     DXR - DXR CERVICAL SPINE COMPLETE  - December 22, 2011  [DATE]

RESULT:     A cervical spine images show subluxation of C4 on C5 and C5 on
C6 with disc space narrowing at C5-C6 and C6-C7. Foraminal narrowing is
present at multiple levels bilaterally from the subluxation and some
uncinate spurring. Facet hypertrophy is present. The open-mouth odontoid
view does not demonstrate the atlantoaxial space or the odontoid adequately.
[place]an[place] projection also is limited in evaluating the odontoid. CT can be
performed if desired. Prevertebral soft tissues appear normal.
IMPRESSION: Chronic severe degenerative changes including
anterolisthesis of C4 on C5 and C5 on C6 with areas of foraminal narrowing
and degenerative disc narrowing. MRI correlation may be beneficial. If there
is concern for fracture from acute trauma been CT of the cervical spine
would be recommended.

[REDACTED]

## 2012-10-05 ENCOUNTER — Encounter (INDEPENDENT_AMBULATORY_CARE_PROVIDER_SITE_OTHER): Payer: Self-pay

## 2012-10-06 ENCOUNTER — Encounter (INDEPENDENT_AMBULATORY_CARE_PROVIDER_SITE_OTHER): Payer: Self-pay

## 2012-10-12 ENCOUNTER — Encounter (INDEPENDENT_AMBULATORY_CARE_PROVIDER_SITE_OTHER): Payer: Self-pay | Admitting: General Surgery

## 2012-10-12 ENCOUNTER — Ambulatory Visit (INDEPENDENT_AMBULATORY_CARE_PROVIDER_SITE_OTHER): Payer: Medicare Other | Admitting: General Surgery

## 2012-10-12 VITALS — BP 118/64 | HR 84 | Temp 97.6°F | Resp 14 | Ht 67.0 in | Wt 158.2 lb

## 2012-10-12 DIAGNOSIS — J984 Other disorders of lung: Secondary | ICD-10-CM | POA: Diagnosis not present

## 2012-10-12 DIAGNOSIS — R05 Cough: Secondary | ICD-10-CM | POA: Diagnosis not present

## 2012-10-12 DIAGNOSIS — I712 Thoracic aortic aneurysm, without rupture: Secondary | ICD-10-CM | POA: Diagnosis not present

## 2012-10-12 DIAGNOSIS — R1031 Right lower quadrant pain: Secondary | ICD-10-CM

## 2012-10-12 DIAGNOSIS — G8929 Other chronic pain: Secondary | ICD-10-CM | POA: Insufficient documentation

## 2012-10-12 NOTE — Progress Notes (Signed)
The patient comes in today with some continued discomfort in the right lower quadrant. Her records were sent in from [place], however I do not see an operative report from 2002. Multiple colonoscopies have been done, and unremarkable at the appendix or the appendiceal orifice.  She still has some tenderness in the right lower quadrant on examination but this is not excruciating. She has been cleared from a cardiac standpoint in order to undergo a colonoscopy in [facility] August 2014. We should wait on that  Procedure before proceeding with possible laparoscopic expiration or surgery. She is return to see me on a when necessary basis at this point.

## 2012-10-22 DIAGNOSIS — Z1231 Encounter for screening mammogram for malignant neoplasm of breast: Secondary | ICD-10-CM | POA: Diagnosis not present

## 2012-10-25 ENCOUNTER — Other Ambulatory Visit (HOSPITAL_COMMUNITY): Payer: Self-pay | Admitting: Internal Medicine

## 2012-10-25 ENCOUNTER — Telehealth (HOSPITAL_COMMUNITY): Payer: Self-pay | Admitting: Internal Medicine

## 2012-10-25 DIAGNOSIS — I340 Nonrheumatic mitral (valve) insufficiency: Secondary | ICD-10-CM

## 2012-10-26 ENCOUNTER — Telehealth: Payer: Self-pay | Admitting: Internal Medicine

## 2012-10-26 DIAGNOSIS — I10 Essential (primary) hypertension: Secondary | ICD-10-CM | POA: Diagnosis not present

## 2012-10-26 DIAGNOSIS — I712 Thoracic aortic aneurysm, without rupture: Secondary | ICD-10-CM | POA: Diagnosis not present

## 2012-11-09 ENCOUNTER — Telehealth (HOSPITAL_COMMUNITY): Payer: Self-pay | Admitting: Internal Medicine

## 2012-11-12 ENCOUNTER — Ambulatory Visit (HOSPITAL_COMMUNITY)
Admission: RE | Admit: 2012-11-12 | Discharge: 2012-11-12 | Disposition: A | Discharge status: Home / Self Care | Payer: Medicare Other | Source: Ambulatory Visit | Attending: Internal Medicine | Admitting: Internal Medicine

## 2012-11-12 DIAGNOSIS — I080 Rheumatic disorders of both mitral and aortic valves: Secondary | ICD-10-CM | POA: Insufficient documentation

## 2012-11-12 DIAGNOSIS — I079 Rheumatic tricuspid valve disease, unspecified: Secondary | ICD-10-CM | POA: Insufficient documentation

## 2012-11-12 DIAGNOSIS — I379 Nonrheumatic pulmonary valve disorder, unspecified: Secondary | ICD-10-CM | POA: Insufficient documentation

## 2012-11-12 DIAGNOSIS — I10 Essential (primary) hypertension: Secondary | ICD-10-CM | POA: Insufficient documentation

## 2012-11-12 DIAGNOSIS — I059 Rheumatic mitral valve disease, unspecified: Secondary | ICD-10-CM | POA: Diagnosis not present

## 2012-11-12 DIAGNOSIS — I340 Nonrheumatic mitral (valve) insufficiency: Secondary | ICD-10-CM

## 2012-11-12 NOTE — Progress Notes (Signed)
[facility]   2D echo completed 11/12/2012.   Veda Canning, RDCS

## 2012-11-16 DIAGNOSIS — H524 Presbyopia: Secondary | ICD-10-CM | POA: Diagnosis not present

## 2012-11-16 DIAGNOSIS — H35039 Hypertensive retinopathy, unspecified eye: Secondary | ICD-10-CM | POA: Diagnosis not present

## 2012-11-16 DIAGNOSIS — I10 Essential (primary) hypertension: Secondary | ICD-10-CM | POA: Diagnosis not present

## 2012-11-24 DIAGNOSIS — K625 Hemorrhage of anus and rectum: Secondary | ICD-10-CM | POA: Diagnosis not present

## 2012-11-24 DIAGNOSIS — D126 Benign neoplasm of colon, unspecified: Secondary | ICD-10-CM | POA: Diagnosis not present

## 2012-11-24 DIAGNOSIS — Z86010 Personal history of colonic polyps: Secondary | ICD-10-CM | POA: Diagnosis not present

## 2012-11-24 DIAGNOSIS — Z1211 Encounter for screening for malignant neoplasm of colon: Secondary | ICD-10-CM | POA: Diagnosis not present

## 2012-11-29 DIAGNOSIS — I10 Essential (primary) hypertension: Secondary | ICD-10-CM | POA: Diagnosis not present

## 2012-11-30 ENCOUNTER — Encounter: Payer: Self-pay | Admitting: Internal Medicine

## 2013-01-07 DIAGNOSIS — Z23 Encounter for immunization: Secondary | ICD-10-CM | POA: Diagnosis not present

## 2013-03-16 ENCOUNTER — Encounter: Payer: Self-pay | Admitting: *Deleted

## 2013-03-17 ENCOUNTER — Encounter: Payer: Self-pay | Admitting: Internal Medicine

## 2013-03-17 ENCOUNTER — Ambulatory Visit (INDEPENDENT_AMBULATORY_CARE_PROVIDER_SITE_OTHER): Payer: Medicare Other | Admitting: Internal Medicine

## 2013-03-17 VITALS — BP 134/76 | HR 66 | Ht 67.0 in | Wt 156.8 lb

## 2013-03-17 DIAGNOSIS — I059 Rheumatic mitral valve disease, unspecified: Secondary | ICD-10-CM

## 2013-03-17 DIAGNOSIS — I10 Essential (primary) hypertension: Secondary | ICD-10-CM | POA: Diagnosis not present

## 2013-03-17 DIAGNOSIS — I359 Nonrheumatic aortic valve disorder, unspecified: Secondary | ICD-10-CM

## 2013-03-17 DIAGNOSIS — I340 Nonrheumatic mitral (valve) insufficiency: Secondary | ICD-10-CM

## 2013-03-17 DIAGNOSIS — I099 Rheumatic heart disease, unspecified: Secondary | ICD-10-CM

## 2013-03-17 DIAGNOSIS — I351 Nonrheumatic aortic (valve) insufficiency: Secondary | ICD-10-CM

## 2013-03-17 NOTE — Patient Instructions (Signed)
Your physician wants you to follow-up in:  6 months. You will receive a reminder letter in the mail two months in advance. If you don't receive a letter, please call our office to schedule the follow-up appointment.   

## 2013-03-17 NOTE — Progress Notes (Signed)
OFFICE NOTE  Chief Complaint:  Preoperative clearance  Primary Care Physician: Evlyn Courier, MD  HPI:  Rhonda Spencer is a pleasant 72 year old with history of rheumatoid heart disease and mitral insufficiency as well as aortic insufficiency. Recently, she had an echocardiogram, in August 2013, which showed an EF of greater than 55% with stable LV internal diameter. However, she did have mild to moderate left atrial enlargement, moderate to severe MR and mild to moderate TR, as well as mild to moderate AI. This does seem to have worsened somewhat since her last echo a year prior. In the past 6 months, however, she has had no significant change in her symptomatology. She does get mildly short of breath with marked exertion. However, this is not significantly changed. She is still active without limitations. She was also notably more hypertensive at the time. Weight has been stable, if not perhaps a few pounds lighter. She reports no crescendo shortness of breath symptoms. She also denies any angina, especially with exertion.  I last saw her about 6 months ago prior to a colonoscopy. She underwent a repeat echocardiogram at that time which shows stable moderate mitral regurgitation with mild to moderate tricuspid regurgitation. There clearly are rheumatic features to her bowels. Her EF was actually almost 75%.  She continues to be asymptomatic with normal activities, but does get mildly short of breath with marked exercise for which she rest and the symptoms go away quickly. She has not had any cardiac chest pain.  PMHx:  Past Medical History  Diagnosis Date  . Arthritis   . Heart murmur     mitral insuff, aortic insuff  . Hypertension   . Rheumatoid heart disease   . MR (mitral regurgitation)   . AI (aortic insufficiency)   . History of nuclear stress test 11/2008    bruce myoview; normal pattern of perfusion in allregions, no significant wall motion abnormalities; no ECG changes, no  significant ischemia demonstrated, low risk scan     Past Surgical History  Procedure Laterality Date  . Cholecystectomy    . Excisional hemorrhoidectomy    . Abdominal hysterectomy    . Colon surgery    . Hemi-microdiscectomy lumbar laminectomy level 4  1995  . Transthoracic echocardiogram  11/2011    EF=>55%; LA mild-mod dilated; mod-severe MR; mild-mod TR with normal systolic pressure; mild-mod AV regurg; mild pulm valve regurg     FAMHx:  Family History  Problem Relation Age of Onset  . Bone cancer Maternal Uncle   . Hypertension Mother   . Stroke Mother   . Pneumonia Father   . Diabetes Sister     x2; one was step-sister  . Tuberculosis Sister     step-sister  . Diabetes Son   . Diabetes Daughter     SOCHx:   reports that she has never smoked. She does not have any smokeless tobacco history on file. She reports that she does not drink alcohol or use illicit drugs.  ALLERGIES:  Allergies  Allergen Reactions  . Tarka [Trandolapril-Verapamil Hcl Er] Swelling    Pt reports allergy to any cardiac medication ending in "-il"    ROS: A comprehensive review of systems was negative except for: Neurological: positive for dizziness  HOME MEDS: Current Outpatient Prescriptions  Medication Sig Dispense Refill  . aspirin 81 MG tablet Take 81 mg by mouth daily.      . chlorhexidine (PERIDEX) 0.12 % solution Use as directed.      [name]  docusate sodium (COLACE) 100 MG capsule Take 100 mg by mouth 2 (two) times daily.      . famotidine (PEPCID) 20 MG tablet Take 20 mg by mouth 2 (two) times daily.      . felodipine (PLENDIL) 5 MG 24 hr tablet Take 5 mg by mouth daily.      . hydrochlorothiazide (HYDRODIURIL) 25 MG tablet Take 25 mg by mouth daily.      [name] lidocaine (LIDODERM) 5 % Place 1 patch onto the skin daily. Remove & Discard patch within 12 hours or as directed by MD      . traMADol (ULTRAM) 50 MG tablet Take 50 mg by mouth every 4 (four) hours.       No current  facility-administered medications for this visit.    LABS/IMAGING: No results found for this or any previous visit (from the past 48 hour(s)). No results found.  VITALS: BP 134/76  Pulse 66  Ht 5\' 7"  (1.702 m)  Wt 156 lb 12.8 oz (71.124 kg)  BMI 24.55 kg/m2  EXAM: General appearance: alert and no distress Neck: no adenopathy, no carotid bruit, no JVD, supple, symmetrical, trachea midline and thyroid not enlarged, symmetric, no tenderness/mass/nodules Lungs: clear to auscultation bilaterally Heart: regular rate and rhythm, systolic murmur: late systolic 3/6, blowing at apex and diastolic murmur: holodiastolic 3/6, low pitch at 2nd left intercostal space Abdomen: soft, non-tender; bowel sounds normal; no masses,  no organomegaly Extremities: extremities normal, atraumatic, no cyanosis or edema Pulses: 2+ and symmetric Skin: Skin color, texture, turgor normal. No rashes or lesions Neurologic: Grossly normal  EKG: Normal sinus rhythm at 66  ASSESSMENT: 1. 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 Hypertension 3. LVH by voltage criteria  PLAN: 1.   Mrs. Lacap is doing very well from a cardiovascular standpoint. Her valvular heart disease appears to be stable without new increases in her symptomatology. I would recommend continuing her current medications the plan is to back in 6 months.  Chrystie Nose, MD, [facility] Attending Cardiologist [facility]  HILTY,Kenneth C 03/17/2013, 3:11 PM

## 2013-03-18 ENCOUNTER — Encounter: Payer: Self-pay | Admitting: Internal Medicine

## 2013-04-16 ENCOUNTER — Other Ambulatory Visit: Payer: Self-pay | Admitting: Internal Medicine

## 2013-04-18 NOTE — Telephone Encounter (Signed)
Rx was sent to pharmacy electronically. 

## 2013-06-10 DIAGNOSIS — J984 Other disorders of lung: Secondary | ICD-10-CM | POA: Diagnosis not present

## 2013-06-13 DIAGNOSIS — K5900 Constipation, unspecified: Secondary | ICD-10-CM | POA: Diagnosis not present

## 2013-06-13 DIAGNOSIS — E785 Hyperlipidemia, unspecified: Secondary | ICD-10-CM | POA: Diagnosis not present

## 2013-06-13 DIAGNOSIS — I10 Essential (primary) hypertension: Secondary | ICD-10-CM | POA: Diagnosis not present

## 2013-06-13 DIAGNOSIS — E7800 Pure hypercholesterolemia, unspecified: Secondary | ICD-10-CM | POA: Diagnosis not present

## 2013-07-30 ENCOUNTER — Emergency Department (HOSPITAL_COMMUNITY)
Admission: EM | Admit: 2013-07-30 | Discharge: 2013-07-31 | Disposition: A | Discharge status: Home / Self Care | Payer: Medicare Other | Attending: Emergency Medicine | Admitting: Emergency Medicine

## 2013-07-30 ENCOUNTER — Encounter (HOSPITAL_COMMUNITY): Payer: Self-pay | Admitting: Emergency Medicine

## 2013-07-30 DIAGNOSIS — Z79899 Other long term (current) drug therapy: Secondary | ICD-10-CM | POA: Diagnosis not present

## 2013-07-30 DIAGNOSIS — M545 Low back pain, unspecified: Secondary | ICD-10-CM | POA: Insufficient documentation

## 2013-07-30 DIAGNOSIS — Z7982 Long term (current) use of aspirin: Secondary | ICD-10-CM | POA: Diagnosis not present

## 2013-07-30 DIAGNOSIS — R011 Cardiac murmur, unspecified: Secondary | ICD-10-CM | POA: Insufficient documentation

## 2013-07-30 DIAGNOSIS — I10 Essential (primary) hypertension: Secondary | ICD-10-CM | POA: Insufficient documentation

## 2013-07-30 DIAGNOSIS — M549 Dorsalgia, unspecified: Secondary | ICD-10-CM

## 2013-07-30 DIAGNOSIS — M069 Rheumatoid arthritis, unspecified: Secondary | ICD-10-CM | POA: Insufficient documentation

## 2013-07-30 DIAGNOSIS — M4310 Spondylolisthesis, site unspecified: Secondary | ICD-10-CM | POA: Diagnosis not present

## 2013-07-30 MED ORDER — KETOROLAC TROMETHAMINE 60 MG/2ML IM SOLN
60.0000 mg | Freq: Once | INTRAMUSCULAR | Status: AC
  Administered 2013-07-31: 60 mg via INTRAMUSCULAR
  Filled 2013-07-30: qty 2

## 2013-07-30 MED ORDER — TRAMADOL HCL 50 MG PO TABS
50.0000 mg | ORAL_TABLET | Freq: Once | ORAL | Status: DC
  Filled 2013-07-30: qty 1

## 2013-07-30 NOTE — ED Notes (Signed)
Pt states that when her pain started a few days ago, she took a tramadol which relieved her pain.

## 2013-07-30 NOTE — ED Provider Notes (Signed)
CSN: [id number]     Arrival date & time 07/30/13  2144 History   First MD Initiated Contact with Patient 07/30/13 2256     Chief Complaint  Patient presents with  . Back Pain     (Consider location/radiation/quality/duration/timing/severity/associated sxs/prior Treatment) HPI Comments: 73 year old female, history of rheumatoid arthritis and hypertension who presents with a complaint of right lower back pain. She states this started approximately 2 weeks ago and has been intermittent in fact she stated she had no pain yesterday at all. Today her pain has been increased in the lower back, nothing seems to make it better, worse with palpation of the lower back, relieved with movement from side to side, not associated with dysuria, hematuria, fevers, chills, nausea or vomiting. She has some radiation of the pain to the right lower quadrant, she describes this as a crampy pain. She has not had this pain in the past, she did call her doctor earlier in the week to request an x-ray be done. She has no history of cancer, no history of IV drug use, fevers, urinary complaints including retention or incontinence. He denies any weakness or numbness of the lower extremities.  Patient is a 73 y.o. female presenting with back pain. The history is provided by the patient.  Back Pain   Past Medical History  Diagnosis Date  . Arthritis   . Heart murmur     mitral insuff, aortic insuff  . Hypertension   . Rheumatoid heart disease   . MR (mitral regurgitation)   . AI (aortic insufficiency)   . History of nuclear stress test 11/2008    bruce myoview; normal pattern of perfusion in allregions, no significant wall motion abnormalities; no ECG changes, no significant ischemia demonstrated, low risk scan    Past Surgical History  Procedure Laterality Date  . Cholecystectomy    . Excisional hemorrhoidectomy    . Abdominal hysterectomy    . Colon surgery    . Hemi-microdiscectomy lumbar laminectomy level 4   1995  . Transthoracic echocardiogram  11/2011    EF=>55%; LA mild-mod dilated; mod-severe MR; mild-mod TR with normal systolic pressure; mild-mod AV regurg; mild pulm valve regurg    Family History  Problem Relation Age of Onset  . Bone cancer Maternal Uncle   . Hypertension Mother   . Stroke Mother   . Pneumonia Father   . Diabetes Sister     x2; one was step-sister  . Tuberculosis Sister     step-sister  . Diabetes Son   . Diabetes Daughter    History  Substance Use Topics  . Smoking status: Never Smoker   . Smokeless tobacco: Not on file  . Alcohol Use: No   OB History   Grav Para Term Preterm Abortions TAB SAB Ect Mult Living                 Review of Systems  Musculoskeletal: Positive for back pain.  All other systems reviewed and are negative.     Allergies  Tarka  Home Medications   Prior to Admission medications   Medication Sig Start Date End Date Taking? Authorizing Provider  aspirin 81 MG tablet Take 81 mg by mouth daily.   Yes Historical Provider, MD  chlorhexidine (PERIDEX) 0.12 % solution Use as directed. 03/08/13  Yes Historical Provider, MD  docusate sodium (COLACE) 100 MG capsule Take 100 mg by mouth 2 (two) times daily.   Yes Historical Provider, MD  famotidine (PEPCID) 20 MG tablet Take  20 mg by mouth 2 (two) times daily.   Yes Historical Provider, MD  felodipine (PLENDIL) 5 MG 24 hr tablet Take 5 mg by mouth daily.   Yes Historical Provider, MD  hydrochlorothiazide (HYDRODIURIL) 25 MG tablet Take 25 mg by mouth at bedtime.   Yes Historical Provider, MD  lidocaine (LIDODERM) 5 % Place 1 patch onto the skin daily. Remove & Discard patch within 12 hours or as directed by MD   Yes Historical Provider, MD  traMADol (ULTRAM) 50 MG tablet Take 50 mg by mouth every 4 (four) hours.   Yes Historical Provider, MD   BP 150/82  Pulse 54  Temp(Src) 98.3 F (36.8 C) (Oral)  Resp 20  Wt 150 lb 2 oz (68.096 kg)  SpO2 100% Physical Exam  Nursing note and  vitals reviewed. Constitutional: She appears well-developed and well-nourished. No distress.  HENT:  Head: Normocephalic and atraumatic.  Mouth/Throat: Oropharynx is clear and moist. No oropharyngeal exudate.  Eyes: Conjunctivae and EOM are normal. Pupils are equal, round, and reactive to light. Right eye exhibits no discharge. Left eye exhibits no discharge. No scleral icterus.  Neck: Normal range of motion. Neck supple. No JVD present. No thyromegaly present.  Cardiovascular: Normal rate, regular rhythm, normal heart sounds and intact distal pulses.  Exam reveals no gallop and no friction rub.   No murmur heard. Pulmonary/Chest: Effort normal and breath sounds normal. No respiratory distress. She has no wheezes. She has no rales.  Abdominal: Soft. Bowel sounds are normal. She exhibits no distension and no mass. There is tenderness ( Minimal right-sided tenderness in the right side, no pain at McBurney's point).  Mild right-sided CVA tenderness, tenderness to palpation across the right lower back and right side  Musculoskeletal: Normal range of motion. She exhibits no edema and no tenderness.  Lymphadenopathy:    She has no cervical adenopathy.  Neurological: She is alert. Coordination normal.  Skin: Skin is warm and dry. No rash noted. No erythema.  Psychiatric: She has a normal mood and affect. Her behavior is normal.    ED Course  Procedures (including critical care time) Labs Review Labs Reviewed  URINALYSIS, ROUTINE W REFLEX MICROSCOPIC - Abnormal; Notable for the following:    Leukocytes, UA MODERATE (*)    All other components within normal limits  URINE MICROSCOPIC-ADD ON    Imaging Review Dg Lumbar Spine Complete  07/31/2013   CLINICAL DATA:  Right lower back pain for several weeks.  EXAM: LUMBAR SPINE - COMPLETE 4+ VIEW  COMPARISON:  CT of the abdomen and pelvis performed 08/25/2012  FINDINGS: There is no evidence of acute fracture or subluxation. There is perhaps slightly  worsened grade 1 anterolisthesis of L4 on L5, reflecting underlying facet disease. Disc space narrowing is noted at L4-L5 and L5-S1.  The visualized bowel gas pattern is unremarkable in appearance; air and stool are noted within the colon. The sacroiliac joints are within normal limits. Clips are noted within the right upper quadrant, reflecting prior cholecystectomy.  IMPRESSION: 1. No evidence of acute fracture or subluxation along the lumbar spine. 2. Perhaps slightly worsened grade 1 anterolisthesis of L4 on L5, reflecting underlying chronic facet disease.   Electronically Signed   By: Garald Balding M.D.   On: 07/31/2013 01:05     EKG Interpretation None      MDM   Final diagnoses:  Back pain    The patient has normal neurologic exam, has no signs of focal neurologic disease, no red  flags for pathologic findings regarding back pain, my bedside ultrasound reveals no signs of hydronephrosis nor does it show any signs of aortic aneurysm. The patient likely has musculoskeletal back pain, she is concerned that it may be something worse, x-ray will be performed, pain medication ordered, the patient otherwise appears neurologically stable.  EMERGENCY DEPARTMENT ULTRASOUND  Study: Limited Retroperitoneal Ultrasound of the Abdominal Aorta.  INDICATIONS:Abdominal pain, Back pain and Age>55 Multiple views of the abdominal aorta were obtained in real-time from the diaphragmatic hiatus to the aortic bifurcation in transverse planes with a multi-frequency probe. PERFORMED BY: Myself IMAGES ARCHIVED?: Yes FINDINGS: Maximum aortic dimensions are 2.8 LIMITATIONS:  Abdominal pain INTERPRETATION:  No abdominal aortic aneurysm and Abdominal free fluid absent - Large hepatic cyst present (pt states she was aware of this)  Emergency Focused Ultrasound Exam Limited retroperitoneal ultrasound of kidneys  Performed and interpreted by Dr. Sabra Heck Indication: flank pain Focused abdominal ultrasound with  both kidneys imaged in transverse and longitudinal planes in real-time. Interpretation: No hydronephrosis visualized.  No stones or cysts visualized  Images archived electronically   Pain improved, x-rays showed anterolisthesis, no other significant findings, urinalysis without infection  Pain medications given as below, stable for discharge   Meds given in ED:  Medications  traMADol (ULTRAM) tablet 50 mg (50 mg Oral Not Given 07/31/13 0003)  ketorolac (TORADOL) injection 60 mg (60 mg Intramuscular Given 07/31/13 0002)    New Prescriptions   METHOCARBAMOL (ROBAXIN) 500 MG TABLET    Take 1 tablet (500 mg total) by mouth 2 (two) times daily as needed for muscle spasms.   NAPROXEN (NAPROSYN) 500 MG TABLET    Take 1 tablet (500 mg total) by mouth 2 (two) times daily with a meal.        Johnna Acosta, MD 07/31/13 [phone]

## 2013-07-30 NOTE — ED Notes (Signed)
Pt states right back pain that started two weeks prior, but resolved two days ago, to return today.

## 2013-07-31 ENCOUNTER — Emergency Department (HOSPITAL_COMMUNITY): Payer: Medicare Other

## 2013-07-31 DIAGNOSIS — M4310 Spondylolisthesis, site unspecified: Secondary | ICD-10-CM | POA: Diagnosis not present

## 2013-07-31 LAB — URINALYSIS, ROUTINE W REFLEX MICROSCOPIC
Bilirubin Urine: NEGATIVE
GLUCOSE, UA: NEGATIVE mg/dL
Hgb urine dipstick: NEGATIVE
KETONES UR: NEGATIVE mg/dL
Nitrite: NEGATIVE
PROTEIN: NEGATIVE mg/dL
Specific Gravity, Urine: 1.024 (ref 1.005–1.030)
UROBILINOGEN UA: 1 mg/dL (ref 0.0–1.0)
pH: 6.5 (ref 5.0–8.0)

## 2013-07-31 LAB — URINE MICROSCOPIC-ADD ON

## 2013-07-31 MED ORDER — METHOCARBAMOL 500 MG PO TABS: 500.0000 mg | ORAL_TABLET | Freq: Two times a day (BID) | ORAL | Status: DC | PRN

## 2013-07-31 MED ORDER — NAPROXEN 500 MG PO TABS: 500.0000 mg | ORAL_TABLET | Freq: Two times a day (BID) | ORAL | Status: DC

## 2013-07-31 NOTE — Discharge Instructions (Signed)
Please be cautious taking medications that are new to you, they may have side effects that you are unaware of.    Please call your doctor for a followup appointment within 24-48 hours. When you talk to your doctor please let them know that you were seen in the emergency department and have them acquire all of your records so that they can discuss the findings with you and formulate a treatment plan to fully care for your new and ongoing problems.   Back Pain:   Your back pain should be treated with medicines such as ibuprofen or aleve and this back pain should get better over the next 2 weeks.  However if you develop severe or worsening pain, low back pain with fever, numbness, weakness or inability to walk or urinate, you should return to the ER immediately.  Please follow up with your doctor this week for a recheck if still having symptoms. Low back pain is discomfort in the lower back that may be due to injuries to muscles and ligaments around the spine.  Occasionally, it may be caused by a a problem to a part of the spine called a disc.  The pain may last several days or a week;  However, most patients get completely well in 4 weeks.  Self - care:  The application of heat can help soothe the pain.  Maintaining your daily activities, including walking, is encourged, as it will help you get better faster than just staying in bed.  Medications are also useful to help with pain control.  A commonly prescribed medications includes acetaminophen.  This medication is generally safe, though you should not take more than 8 of the extra strength (500mg ) pills a day.  Non steroidal anti inflammatory medications including Ibuprofen and naproxen;  These medications help both pain and swelling and are very useful in treating back pain.  They should be taken with food, as they can cause stomach upset, and more seriously, stomach bleeding.    Muscle relaxants:  These medications can help with muscle tightness  that is a cause of lower back pain.  Most of these medications can cause drowsiness, and it is not safe to drive or use dangerous machinery while taking them.  You will need to follow up with  Your primary healthcare provider in 1-2 weeks for reassessment.  Be aware that if you develop new symptoms, such as a fever, leg weakness, difficulty with or loss of control of your urine or bowels, abdominal pain, or more severe pain, you will need to seek medical attention and  / or return to the Emergency department.  If you do not have a doctor see the list below.

## 2013-07-31 NOTE — ED Notes (Signed)
Sabra Heck, MD is aware of the pt's pulse rate.

## 2013-08-03 DIAGNOSIS — M25569 Pain in unspecified knee: Secondary | ICD-10-CM | POA: Diagnosis not present

## 2013-08-03 DIAGNOSIS — M545 Low back pain, unspecified: Secondary | ICD-10-CM | POA: Diagnosis not present

## 2013-08-08 ENCOUNTER — Emergency Department (HOSPITAL_COMMUNITY)
Admission: EM | Admit: 2013-08-08 | Discharge: 2013-08-08 | Disposition: A | Discharge status: Home / Self Care | Payer: Medicare Other | Attending: Emergency Medicine | Admitting: Emergency Medicine

## 2013-08-08 ENCOUNTER — Emergency Department (HOSPITAL_COMMUNITY): Payer: Medicare Other

## 2013-08-08 ENCOUNTER — Encounter (HOSPITAL_COMMUNITY): Payer: Self-pay | Admitting: Emergency Medicine

## 2013-08-08 DIAGNOSIS — R220 Localized swelling, mass and lump, head: Secondary | ICD-10-CM | POA: Diagnosis not present

## 2013-08-08 DIAGNOSIS — Z79899 Other long term (current) drug therapy: Secondary | ICD-10-CM | POA: Diagnosis not present

## 2013-08-08 DIAGNOSIS — Z791 Long term (current) use of non-steroidal anti-inflammatories (NSAID): Secondary | ICD-10-CM | POA: Insufficient documentation

## 2013-08-08 DIAGNOSIS — S01409A Unspecified open wound of unspecified cheek and temporomandibular area, initial encounter: Secondary | ICD-10-CM | POA: Diagnosis not present

## 2013-08-08 DIAGNOSIS — S0181XA Laceration without foreign body of other part of head, initial encounter: Secondary | ICD-10-CM

## 2013-08-08 DIAGNOSIS — S0990XA Unspecified injury of head, initial encounter: Secondary | ICD-10-CM | POA: Diagnosis not present

## 2013-08-08 DIAGNOSIS — S0003XA Contusion of scalp, initial encounter: Secondary | ICD-10-CM | POA: Insufficient documentation

## 2013-08-08 DIAGNOSIS — M129 Arthropathy, unspecified: Secondary | ICD-10-CM | POA: Insufficient documentation

## 2013-08-08 DIAGNOSIS — S1093XA Contusion of unspecified part of neck, initial encounter: Secondary | ICD-10-CM

## 2013-08-08 DIAGNOSIS — Y9289 Other specified places as the place of occurrence of the external cause: Secondary | ICD-10-CM | POA: Insufficient documentation

## 2013-08-08 DIAGNOSIS — I10 Essential (primary) hypertension: Secondary | ICD-10-CM | POA: Insufficient documentation

## 2013-08-08 DIAGNOSIS — Y9301 Activity, walking, marching and hiking: Secondary | ICD-10-CM | POA: Insufficient documentation

## 2013-08-08 DIAGNOSIS — R011 Cardiac murmur, unspecified: Secondary | ICD-10-CM | POA: Diagnosis not present

## 2013-08-08 DIAGNOSIS — S0083XA Contusion of other part of head, initial encounter: Secondary | ICD-10-CM | POA: Insufficient documentation

## 2013-08-08 DIAGNOSIS — Z7982 Long term (current) use of aspirin: Secondary | ICD-10-CM | POA: Diagnosis not present

## 2013-08-08 DIAGNOSIS — R221 Localized swelling, mass and lump, neck: Secondary | ICD-10-CM | POA: Diagnosis not present

## 2013-08-08 DIAGNOSIS — W010XXA Fall on same level from slipping, tripping and stumbling without subsequent striking against object, initial encounter: Secondary | ICD-10-CM | POA: Insufficient documentation

## 2013-08-08 NOTE — ED Notes (Signed)
Pt state she fell this am while walking quickly to car. Pt states she tripped and glasses caused small 1.5 cm laceration next to L eye. Pt denies LOC or blood thinner use.

## 2013-08-08 NOTE — ED Provider Notes (Signed)
Medical screening examination/treatment/procedure(s) were performed by non-physician practitioner and as supervising physician I was immediately available for consultation/collaboration.   EKG Interpretation None        Orpah Greek, MD 08/08/13 1209

## 2013-08-08 NOTE — ED Provider Notes (Signed)
CSN: [id number]     Arrival date & time 08/08/13  0907 History   First MD Initiated Contact with Patient 08/08/13 1022     Chief Complaint  Patient presents with  . Fall  . Facial Injury     (Consider location/radiation/quality/duration/timing/severity/associated sxs/prior Treatment) HPI Pt is a 73yo female c/o left sided facial pain and swelling with 2 small laceration to left side of face after trip and fall in parking lot while walking quickly to the car just PTA. Pt states she was wearing metal framed glasses she believes cut her face. C/o constant aching, sore, left sided facial pain that has improved since being in ED applying ice pack. Denies change in vision. Denies neck pain. Denies LOC. Denies being on blood thinners. Denies other injuries.  Past Medical History  Diagnosis Date  . Arthritis   . Heart murmur     mitral insuff, aortic insuff  . Hypertension   . Rheumatoid heart disease   . MR (mitral regurgitation)   . AI (aortic insufficiency)   . History of nuclear stress test 11/2008    bruce myoview; normal pattern of perfusion in allregions, no significant wall motion abnormalities; no ECG changes, no significant ischemia demonstrated, low risk scan    Past Surgical History  Procedure Laterality Date  . Cholecystectomy    . Excisional hemorrhoidectomy    . Abdominal hysterectomy    . Colon surgery    . Hemi-microdiscectomy lumbar laminectomy level 4  1995  . Transthoracic echocardiogram  11/2011    EF=>55%; LA mild-mod dilated; mod-severe MR; mild-mod TR with normal systolic pressure; mild-mod AV regurg; mild pulm valve regurg    Family History  Problem Relation Age of Onset  . Bone cancer Maternal Uncle   . Hypertension Mother   . Stroke Mother   . Pneumonia Father   . Diabetes Sister     x2; one was step-sister  . Tuberculosis Sister     step-sister  . Diabetes Son   . Diabetes Daughter    History  Substance Use Topics  . Smoking status: Never Smoker     . Smokeless tobacco: Not on file  . Alcohol Use: No   OB History   Grav Para Term Preterm Abortions TAB SAB Ect Mult Living                 Review of Systems  Constitutional: Negative for fever and chills.  HENT: Positive for facial swelling ( around left eye).   Eyes: Negative for photophobia and visual disturbance.  Gastrointestinal: Negative for nausea, vomiting, abdominal pain and diarrhea.  Musculoskeletal: Negative for back pain, neck pain and neck stiffness.  Skin: Positive for wound (2 small lacerations near left eye). Negative for color change.  Neurological: Positive for headaches. Negative for dizziness and light-headedness.  All other systems reviewed and are negative.     Allergies  Tarka  Home Medications   Prior to Admission medications   Medication Sig Start Date End Date Taking? Authorizing Provider  aspirin 81 MG tablet Take 81 mg by mouth daily.    Historical Provider, MD  chlorhexidine (PERIDEX) 0.12 % solution Use as directed. 03/08/13   Historical Provider, MD  docusate sodium (COLACE) 100 MG capsule Take 100 mg by mouth 2 (two) times daily.    Historical Provider, MD  famotidine (PEPCID) 20 MG tablet Take 20 mg by mouth 2 (two) times daily.    Historical Provider, MD  felodipine (PLENDIL) 5 MG 24 hr tablet  Take 5 mg by mouth daily.    Historical Provider, MD  hydrochlorothiazide (HYDRODIURIL) 25 MG tablet Take 25 mg by mouth at bedtime.    Historical Provider, MD  lidocaine (LIDODERM) 5 % Place 1 patch onto the skin daily. Remove & Discard patch within 12 hours or as directed by MD    Historical Provider, MD  methocarbamol (ROBAXIN) 500 MG tablet Take 1 tablet (500 mg total) by mouth 2 (two) times daily as needed for muscle spasms. 07/31/13   Johnna Acosta, MD  naproxen (NAPROSYN) 500 MG tablet Take 1 tablet (500 mg total) by mouth 2 (two) times daily with a meal. 07/31/13   Johnna Acosta, MD  traMADol (ULTRAM) 50 MG tablet Take 50 mg by mouth every 4  (four) hours.    Historical Provider, MD   BP 150/59  Pulse 46  Temp(Src) 98 F (36.7 C) (Oral)  Resp 16  SpO2 99% Physical Exam  Nursing note and vitals reviewed. Constitutional: She is oriented to person, place, and time. She appears well-developed and well-nourished. No distress.  HENT:  Head: Normocephalic. Head is with contusion and with laceration.    Contusion under left eye with 2 small overlying superficial lacerations. No active bleeding. Larger laceration is 1.5cm in length.  Eyes: Conjunctivae and EOM are normal. Pupils are equal, round, and reactive to light. No scleral icterus.  Neck: Normal range of motion. Neck supple.  No midline bone tenderness, no crepitus or step-offs.   Cardiovascular: Normal rate, regular rhythm and normal heart sounds.   Pulmonary/Chest: Effort normal and breath sounds normal. No respiratory distress. She has no wheezes. She has no rales. She exhibits no tenderness.  Abdominal: Soft. Bowel sounds are normal. She exhibits no distension and no mass. There is no tenderness. There is no rebound and no guarding.  Musculoskeletal: Normal range of motion.  Neurological: She is alert and oriented to person, place, and time. Gait normal. GCS eye subscore is 4. GCS verbal subscore is 5. GCS motor subscore is 6.  Skin: Skin is warm and dry. She is not diaphoretic.    ED Course  Procedures   LACERATION REPAIR Performed by: Noland Fordyce Authorized by: Noland Fordyce Consent: Verbal consent obtained. Risks and benefits: risks, benefits and alternatives were discussed Consent given by: patient Patient identity confirmed: provided demographic data Prepped and Draped in normal sterile fashion Wound explored  Laceration Location: over left zygomatic bone  Laceration Length: 1.5cm  No Foreign Bodies seen or palpated  Anesthesia: none  Irrigation method: syringe Amount of cleaning: standard  Skin closure: close, simple  Technique: Dermabond  placed.  Patient tolerance: Patient tolerated the procedure well with no immediate complications.   Labs Review Labs Reviewed - No data to display  Imaging Review Ct Head Wo Contrast  08/08/2013   CLINICAL DATA:  Fall.  Laceration to left eye.  Facial injury.  EXAM: CT HEAD WITHOUT CONTRAST  TECHNIQUE: Contiguous axial images were obtained from the base of the skull through the vertex without intravenous contrast.  COMPARISON:  None.  FINDINGS: Sinuses/Soft tissues: Mild soft tissue swelling about the left maxilla. Incompletely evaluated on image 1. Normal appearance of the globes.  Clear paranasal sinuses and mastoid air cells.  No skull fracture.  Intracranial: Mild low density in the periventricular white matter likely related to small vessel disease. Normal cerebral volume for age. No mass lesion, hemorrhage, hydrocephalus, acute infarct, intra-axial, or extra-axial fluid collection.  IMPRESSION: 1. Incompletely imaged soft tissue swelling  about the left maxilla. 2.  No acute intracranial abnormality. 3. Small vessel ischemic change   Electronically Signed   By: Abigail Miyamoto M.D.   On: 08/08/2013 11:24     EKG Interpretation None      MDM   Final diagnoses:  Fall from slip, trip, or stumble  Facial swelling  Facial laceration   Pt presenting after trip and fall in parking lot. No LOC. Pt is not on blood thinners. Alert and oriented x4. No focal neuro deficit. 1.5cm laceration to left side of face over zygomatic bone. No active bleeding. Able to be repaired with Dermabond. Head CT due to pt's age. No evidence of fracture or intracranial bleed. Discussed pt with Dr. Betsey Holiday. Pt may be discharged home to f/u with PCP. Return precautions provided. Pt verbalized understanding and agreement with tx plan.     Noland Fordyce, PA-C 08/08/13 1146

## 2013-09-12 DIAGNOSIS — M25569 Pain in unspecified knee: Secondary | ICD-10-CM | POA: Diagnosis not present

## 2013-10-03 ENCOUNTER — Ambulatory Visit (INDEPENDENT_AMBULATORY_CARE_PROVIDER_SITE_OTHER): Payer: Medicare Other | Admitting: Internal Medicine

## 2013-10-03 ENCOUNTER — Encounter: Payer: Self-pay | Admitting: Internal Medicine

## 2013-10-03 VITALS — BP 118/62 | HR 64 | Ht 65.0 in | Wt 152.7 lb

## 2013-10-03 DIAGNOSIS — I059 Rheumatic mitral valve disease, unspecified: Secondary | ICD-10-CM

## 2013-10-03 DIAGNOSIS — I10 Essential (primary) hypertension: Secondary | ICD-10-CM

## 2013-10-03 DIAGNOSIS — I359 Nonrheumatic aortic valve disorder, unspecified: Secondary | ICD-10-CM | POA: Diagnosis not present

## 2013-10-03 DIAGNOSIS — I099 Rheumatic heart disease, unspecified: Secondary | ICD-10-CM | POA: Diagnosis not present

## 2013-10-03 DIAGNOSIS — I340 Nonrheumatic mitral (valve) insufficiency: Secondary | ICD-10-CM

## 2013-10-03 DIAGNOSIS — I351 Nonrheumatic aortic (valve) insufficiency: Secondary | ICD-10-CM

## 2013-10-03 NOTE — Progress Notes (Signed)
OFFICE NOTE  Chief Complaint:  Preoperative clearance  Primary Care Physician: Maggie Font, MD  HPI:  Rhonda Spencer is a pleasant 73 year old with history of rheumatoid heart disease and mitral insufficiency as well as aortic insufficiency. Recently, she had an echocardiogram, in August 2013, which showed an EF of greater than 55% with stable LV internal diameter. However, she did have mild to moderate left atrial enlargement, moderate to severe MR and mild to moderate TR, as well as mild to moderate AI. This does seem to have worsened somewhat since her last echo a year prior. In the past 6 months, however, she has had no significant change in her symptomatology. She does get mildly short of breath with marked exertion. However, this is not significantly changed. She is still active without limitations. She was also notably more hypertensive at the time. Weight has been stable, if not perhaps a few pounds lighter. She reports no crescendo shortness of breath symptoms. She also denies any angina, especially with exertion.  I last saw her about 6 months ago prior to a colonoscopy. She underwent a repeat echocardiogram at that time which shows stable moderate mitral regurgitation with mild to moderate tricuspid regurgitation. There clearly are rheumatic features to her bowels. Her EF was actually almost 75%.  She continues to be asymptomatic with normal activities, but does get mildly short of breath with marked exercise for which she rest and the symptoms go away quickly. She has not had any cardiac chest pain.  PMHx:  Past Medical History  Diagnosis Date  . Arthritis   . Heart murmur     mitral insuff, aortic insuff  . Hypertension   . Rheumatoid heart disease   . MR (mitral regurgitation)   . AI (aortic insufficiency)   . History of nuclear stress test 11/2008    bruce myoview; normal pattern of perfusion in allregions, no significant wall motion abnormalities; no ECG changes, no  significant ischemia demonstrated, low risk scan     Past Surgical History  Procedure Laterality Date  . Cholecystectomy    . Excisional hemorrhoidectomy    . Abdominal hysterectomy    . Colon surgery    . Hemi-microdiscectomy lumbar laminectomy level 4  1995  . Transthoracic echocardiogram  11/2011    EF=>55%; LA mild-mod dilated; mod-severe MR; mild-mod TR with normal systolic pressure; mild-mod AV regurg; mild pulm valve regurg     FAMHx:  Family History  Problem Relation Age of Onset  . Bone cancer Maternal Uncle   . Hypertension Mother   . Stroke Mother   . Pneumonia Father   . Diabetes Sister     x2; one was step-sister  . Tuberculosis Sister     step-sister  . Diabetes Son   . Diabetes Daughter     SOCHx:   reports that she has never smoked. She does not have any smokeless tobacco history on file. She reports that she does not drink alcohol or use illicit drugs.  ALLERGIES:  Allergies  Allergen Reactions  . Tarka [Trandolapril-Verapamil Hcl Er] Swelling    Pt reports allergy to any cardiac medication ending in "-il"    ROS: A comprehensive review of systems was negative.  HOME MEDS: Current Outpatient Prescriptions  Medication Sig Dispense Refill  . aspirin 81 MG tablet Take 81 mg by mouth daily.      . chlorhexidine (PERIDEX) 0.12 % solution Use as directed daily.      . cyclobenzaprine (FLEXERIL) 5 MG  tablet Take 5 mg by mouth daily as needed.      . docusate sodium (COLACE) 100 MG capsule Take 100 mg by mouth daily.       . famotidine (PEPCID) 20 MG tablet Take 20 mg by mouth daily.       . felodipine (PLENDIL) 5 MG 24 hr tablet Take 5 mg by mouth daily.      . hydrochlorothiazide (HYDRODIURIL) 25 MG tablet Take 25 mg by mouth at bedtime.      . lidocaine (LIDODERM) 5 % Place 1 patch onto the skin daily. Remove & Discard patch within 12 hours or as directed by MD      . naproxen (NAPROSYN) 500 MG tablet Take 1 tablet (500 mg total) by mouth 2 (two) times  daily with a meal.  30 tablet  0   No current facility-administered medications for this visit.    LABS/IMAGING: No results found for this or any previous visit (from the past 48 hour(s)). No results found.  VITALS: BP 118/62  Pulse 64  Ht 5\' 5"  (1.651 m)  Wt 152 lb 11.2 oz (69.264 kg)  BMI 25.41 kg/m2  EXAM: General appearance: alert and no distress Neck: no adenopathy, no carotid bruit, no JVD, supple, symmetrical, trachea midline and thyroid not enlarged, symmetric, no tenderness/mass/nodules Lungs: clear to auscultation bilaterally Heart: regular rate and rhythm, systolic murmur: late systolic 3/6, blowing at apex and diastolic murmur: holodiastolic 3/6, low pitch at 2nd left intercostal space Abdomen: soft, non-tender; bowel sounds normal; no masses,  no organomegaly Extremities: extremities normal, atraumatic, no cyanosis or edema Pulses: 2+ and symmetric Skin: Skin color, texture, turgor normal. No rashes or lesions Neurologic: Grossly normal  EKG: Normal sinus rhythm at 64  ASSESSMENT: 1. 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 Hypertension 3. LVH by voltage criteria  PLAN: 1.   [name] is doing very well from a cardiovascular standpoint. Her valvular heart disease appears to be stable without new increases in her symptomatology. I would recommend continuing her current medications the plan is to back in 6 months.  Pixie Casino, MD, [facility] Attending Cardiologist [facility] [place] [facility] 10/03/2013, 9:28 AM

## 2013-10-03 NOTE — Patient Instructions (Signed)
Your physician recommends that you schedule a follow-up appointment in: 6 months with Dr.Hilty  

## 2013-11-02 DIAGNOSIS — M25569 Pain in unspecified knee: Secondary | ICD-10-CM | POA: Diagnosis not present

## 2013-11-16 DIAGNOSIS — Z1231 Encounter for screening mammogram for malignant neoplasm of breast: Secondary | ICD-10-CM | POA: Diagnosis not present

## 2013-11-30 ENCOUNTER — Other Ambulatory Visit: Payer: Self-pay | Admitting: Family Medicine

## 2013-11-30 ENCOUNTER — Ambulatory Visit
Admission: RE | Admit: 2013-11-30 | Discharge: 2013-11-30 | Disposition: A | Discharge status: Home / Self Care | Payer: Medicare Other | Source: Ambulatory Visit | Attending: Family Medicine | Admitting: Family Medicine

## 2013-11-30 DIAGNOSIS — M1710 Unilateral primary osteoarthritis, unspecified knee: Secondary | ICD-10-CM | POA: Diagnosis not present

## 2013-11-30 DIAGNOSIS — M25569 Pain in unspecified knee: Secondary | ICD-10-CM | POA: Diagnosis not present

## 2013-11-30 DIAGNOSIS — IMO0002 Reserved for concepts with insufficient information to code with codable children: Secondary | ICD-10-CM | POA: Diagnosis not present

## 2013-11-30 DIAGNOSIS — M25562 Pain in left knee: Secondary | ICD-10-CM

## 2014-01-18 DIAGNOSIS — R918 Other nonspecific abnormal finding of lung field: Secondary | ICD-10-CM | POA: Diagnosis present

## 2014-01-18 DIAGNOSIS — R911 Solitary pulmonary nodule: Secondary | ICD-10-CM | POA: Diagnosis not present

## 2014-02-27 DIAGNOSIS — G4762 Sleep related leg cramps: Secondary | ICD-10-CM | POA: Diagnosis not present

## 2014-03-16 ENCOUNTER — Encounter: Payer: Self-pay | Admitting: Internal Medicine

## 2014-03-16 ENCOUNTER — Ambulatory Visit (INDEPENDENT_AMBULATORY_CARE_PROVIDER_SITE_OTHER): Payer: Medicare Other | Admitting: Internal Medicine

## 2014-03-16 VITALS — BP 130/70 | HR 58 | Ht 65.0 in | Wt 154.1 lb

## 2014-03-16 DIAGNOSIS — I351 Nonrheumatic aortic (valve) insufficiency: Secondary | ICD-10-CM | POA: Diagnosis not present

## 2014-03-16 DIAGNOSIS — I099 Rheumatic heart disease, unspecified: Secondary | ICD-10-CM

## 2014-03-16 DIAGNOSIS — I10 Essential (primary) hypertension: Secondary | ICD-10-CM

## 2014-03-16 DIAGNOSIS — I340 Nonrheumatic mitral (valve) insufficiency: Secondary | ICD-10-CM | POA: Diagnosis not present

## 2014-03-16 NOTE — Progress Notes (Signed)
OFFICE NOTE  Chief Complaint:  Routine follow-up  Primary Care Physician: Maggie Font, MD  HPI:  Rhonda Spencer is a pleasant 73 year old with history of rheumatoid heart disease and mitral insufficiency as well as aortic insufficiency. Recently, she had an echocardiogram, in August 2013, which showed an EF of greater than 55% with stable LV internal diameter. However, she did have mild to moderate left atrial enlargement, moderate to severe MR and mild to moderate TR, as well as mild to moderate AI. This does seem to have worsened somewhat since her last echo a year prior. In the past 6 months, however, she has had no significant change in her symptomatology. She does get mildly short of breath with marked exertion. However, this is not significantly changed. She is still active without limitations. She was also notably more hypertensive at the time. Weight has been stable, if not perhaps a few pounds lighter. She reports no crescendo shortness of breath symptoms. She also denies any angina, especially with exertion.  I last saw her about 6 months ago prior to a colonoscopy. She underwent a repeat echocardiogram at that time which shows stable moderate mitral regurgitation with mild to moderate tricuspid regurgitation. There clearly are rheumatic features to her bowels. Her EF was actually almost 75%.  She continues to be asymptomatic with normal activities, but does get mildly short of breath with marked exercise for which she rest and the symptoms go away quickly. She has not had any cardiac chest pain.  This part returns today for follow-up. She reports feeling well. She occasionally has some problems with arthritis for which he takes prednisone. She's had some spasming is in her neck in her hands for which she takes magnesium. She does get short of breath with activity, but it is fairly stable. Her last echo was over a year and a half ago.  PMHx:  Past Medical History  Diagnosis Date  .  Arthritis   . Heart murmur     mitral insuff, aortic insuff  . Hypertension   . Rheumatoid heart disease   . MR (mitral regurgitation)   . AI (aortic insufficiency)   . History of nuclear stress test 11/2008    bruce myoview; normal pattern of perfusion in allregions, no significant wall motion abnormalities; no ECG changes, no significant ischemia demonstrated, low risk scan     Past Surgical History  Procedure Laterality Date  . Cholecystectomy    . Excisional hemorrhoidectomy    . Abdominal hysterectomy    . Colon surgery    . Hemi-microdiscectomy lumbar laminectomy level 4  1995  . Transthoracic echocardiogram  11/2011    EF=>55%; LA mild-mod dilated; mod-severe MR; mild-mod TR with normal systolic pressure; mild-mod AV regurg; mild pulm valve regurg     FAMHx:  Family History  Problem Relation Age of Onset  . Bone cancer Maternal Uncle   . Hypertension Mother   . Stroke Mother   . Pneumonia Father   . Diabetes Sister     x2; one was step-sister  . Tuberculosis Sister     step-sister  . Diabetes Son   . Diabetes Daughter     SOCHx:   reports that she has never smoked. She does not have any smokeless tobacco history on file. She reports that she does not drink alcohol or use illicit drugs.  ALLERGIES:  Allergies  Allergen Reactions  . Tarka [Trandolapril-Verapamil Hcl Er] Swelling    Pt reports allergy to any cardiac  medication ending in "-il"    ROS: A comprehensive review of systems was negative except for: Respiratory: positive for dyspnea on exertion  HOME MEDS: Current Outpatient Prescriptions  Medication Sig Dispense Refill  . aspirin 81 MG tablet Take 81 mg by mouth daily.    . chlorhexidine (PERIDEX) 0.12 % solution Use as directed daily.    . cyclobenzaprine (FLEXERIL) 5 MG tablet Take 5 mg by mouth daily as needed.    . docusate sodium (COLACE) 100 MG capsule Take 100 mg by mouth daily.     . famotidine (PEPCID) 20 MG tablet Take 20 mg by mouth  daily.     . felodipine (PLENDIL) 5 MG 24 hr tablet Take 5 mg by mouth daily.    . hydrochlorothiazide (HYDRODIURIL) 25 MG tablet Take 25 mg by mouth at bedtime.    . lidocaine (LIDODERM) 5 % Place 1 patch onto the skin daily. Remove & Discard patch within 12 hours or as directed by MD    . Magnesium 250 MG TABS Take 250 mg by mouth daily.    . naproxen (NAPROSYN) 500 MG tablet Take 1 tablet (500 mg total) by mouth 2 (two) times daily with a meal. 30 tablet 0  . predniSONE (DELTASONE) 5 MG tablet Take 5 mg by mouth as needed.     No current facility-administered medications for this visit.    LABS/IMAGING: No results found for this or any previous visit (from the past 48 hour(s)). No results found.  VITALS: BP 130/70 mmHg  Pulse 58  Ht 5\' 5"  (1.651 m)  Wt 154 lb 1.6 oz (69.899 kg)  BMI 25.64 kg/m2  EXAM: General appearance: alert and no distress Neck: no adenopathy, no carotid bruit, no JVD, supple, symmetrical, trachea midline and thyroid not enlarged, symmetric, no tenderness/mass/nodules Lungs: clear to auscultation bilaterally Heart: regular rate and rhythm, systolic murmur: late systolic 3/6, blowing at apex and diastolic murmur: holodiastolic 3/6, low pitch at 2nd left intercostal space Abdomen: soft, non-tender; bowel sounds normal; no masses,  no organomegaly Extremities: extremities normal, atraumatic, no cyanosis or edema Pulses: 2+ and symmetric Skin: Skin color, texture, turgor normal. No rashes or lesions Neurologic: Grossly normal  EKG: Sinus bradycardia 58  ASSESSMENT: 1. 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 Hypertension 3. LVH by voltage criteria  PLAN: 1.   [name] is doing very well from a cardiovascular standpoint. She denies any significant worsening or shortness of breath. Her blood pressure is well-controlled. She is due for an echocardiogram for continued  follow-up of her moderate to severe mitral regurgitation. There are still no clear indications for surgical intervention. I would like to repeat her echo and we will contact her with those results. Otherwise follow-up will be in 6 months.  Pixie Casino, MD, [facility] Attending Cardiologist [facility] [place] 03/16/2014, 4:12 PM

## 2014-03-16 NOTE — Patient Instructions (Signed)
Your physician has requested that you have an echocardiogram. Echocardiography is a painless test that uses sound waves to create images of your heart. It provides your doctor with information about the size and shape of your heart and how well your heart's chambers and valves are working. This procedure takes approximately one hour. There are no restrictions for this procedure.   Your physician wants you to follow-up in: 6 months with Dr. Hilty. You will receive a reminder letter in the mail two months in advance. If you don't receive a letter, please call our office to schedule the follow-up appointment.  

## 2014-03-22 ENCOUNTER — Ambulatory Visit (HOSPITAL_COMMUNITY)
Admission: RE | Admit: 2014-03-22 | Discharge: 2014-03-22 | Disposition: A | Discharge status: Home / Self Care | Payer: Medicare Other | Source: Ambulatory Visit | Attending: Internal Medicine | Admitting: Internal Medicine

## 2014-03-22 DIAGNOSIS — I10 Essential (primary) hypertension: Secondary | ICD-10-CM | POA: Insufficient documentation

## 2014-03-22 DIAGNOSIS — I059 Rheumatic mitral valve disease, unspecified: Secondary | ICD-10-CM

## 2014-03-22 DIAGNOSIS — I099 Rheumatic heart disease, unspecified: Secondary | ICD-10-CM

## 2014-03-22 DIAGNOSIS — I351 Nonrheumatic aortic (valve) insufficiency: Secondary | ICD-10-CM

## 2014-03-22 DIAGNOSIS — I340 Nonrheumatic mitral (valve) insufficiency: Secondary | ICD-10-CM

## 2014-03-22 NOTE — Progress Notes (Signed)
2D Echo Performed 03/22/2014    Marygrace Drought, RCS

## 2014-04-21 DIAGNOSIS — H9113 Presbycusis, bilateral: Secondary | ICD-10-CM | POA: Diagnosis not present

## 2014-04-21 DIAGNOSIS — H6123 Impacted cerumen, bilateral: Secondary | ICD-10-CM | POA: Diagnosis not present

## 2014-04-30 ENCOUNTER — Other Ambulatory Visit: Payer: Self-pay | Admitting: Internal Medicine

## 2014-04-30 NOTE — Telephone Encounter (Signed)
Rx(s) sent to pharmacy electronically.  

## 2014-05-13 IMAGING — CR DG LUMBAR SPINE COMPLETE 4+V
5 series · 5 of 5 positions shown · non-contrast
Comparison: CT of the abdomen and pelvis performed 08/25/2012

CLINICAL DATA: Right lower back pain for several weeks.

EXAM:
LUMBAR SPINE - COMPLETE 4+ VIEW

[t lumbar spine ap]
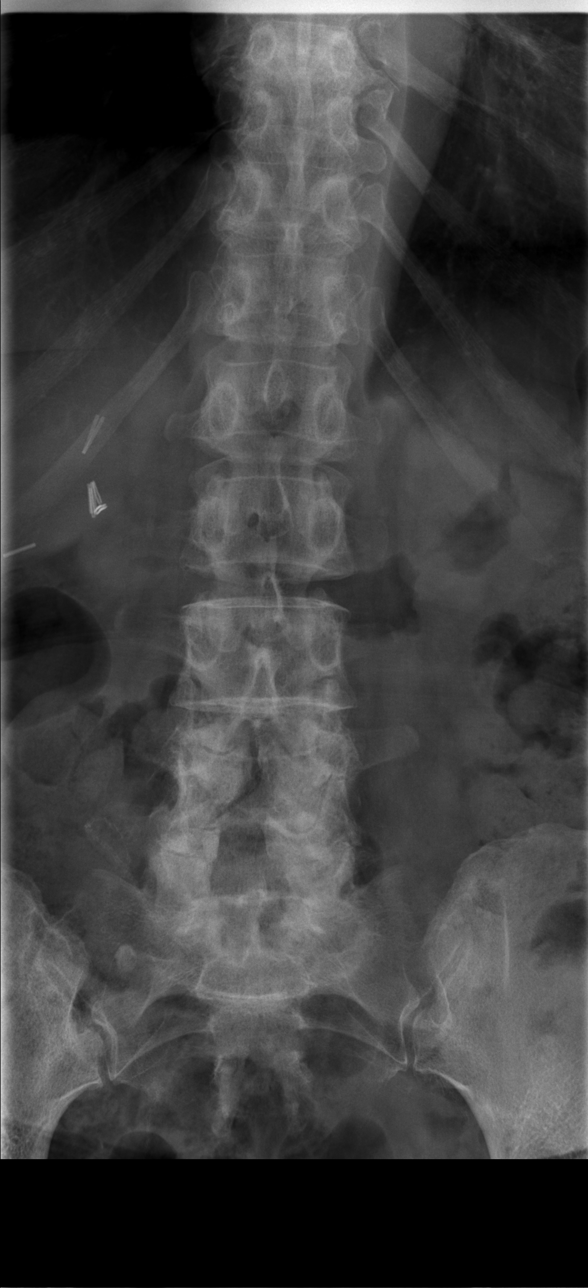

[t lumbar spine obl (1 of 2)]
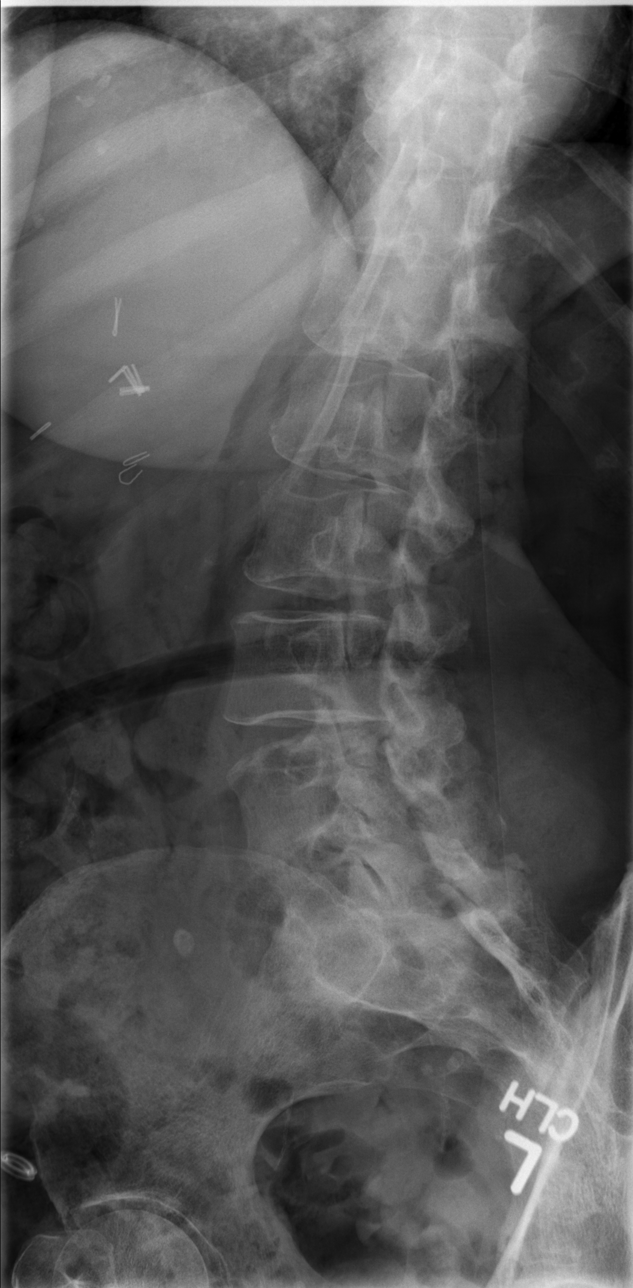

[t lumbar spine obl (2 of 2)]
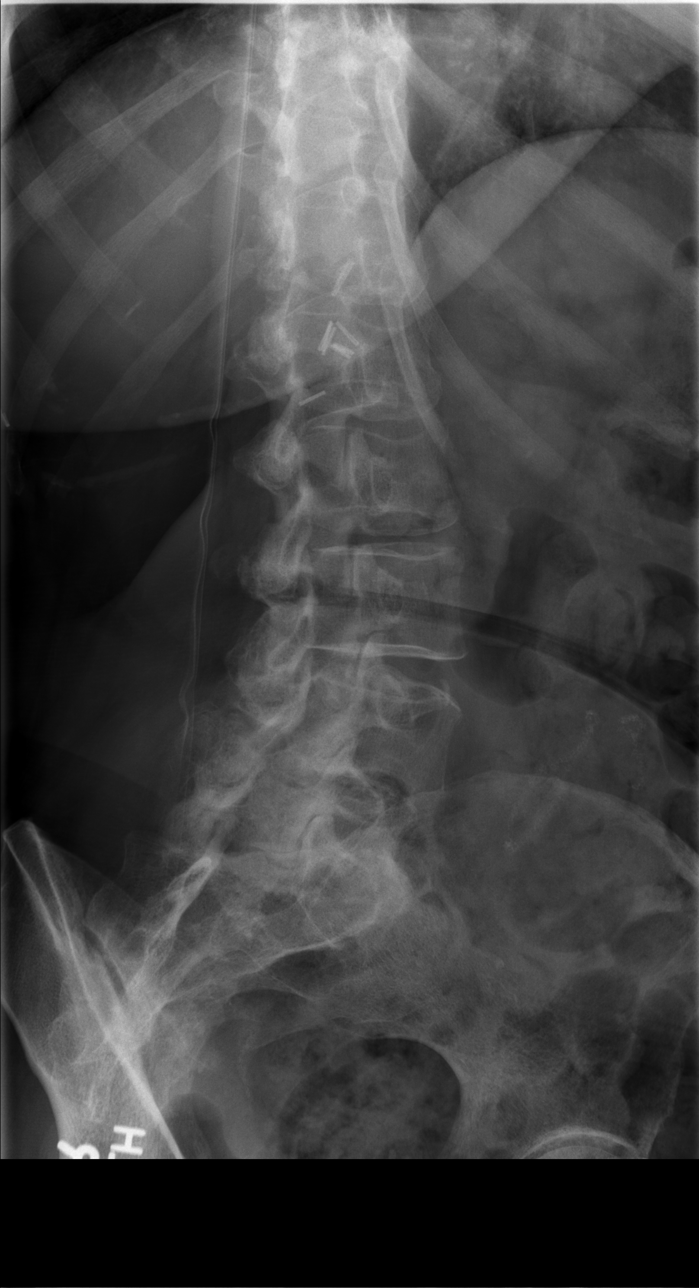

[t lumbar spine lat]
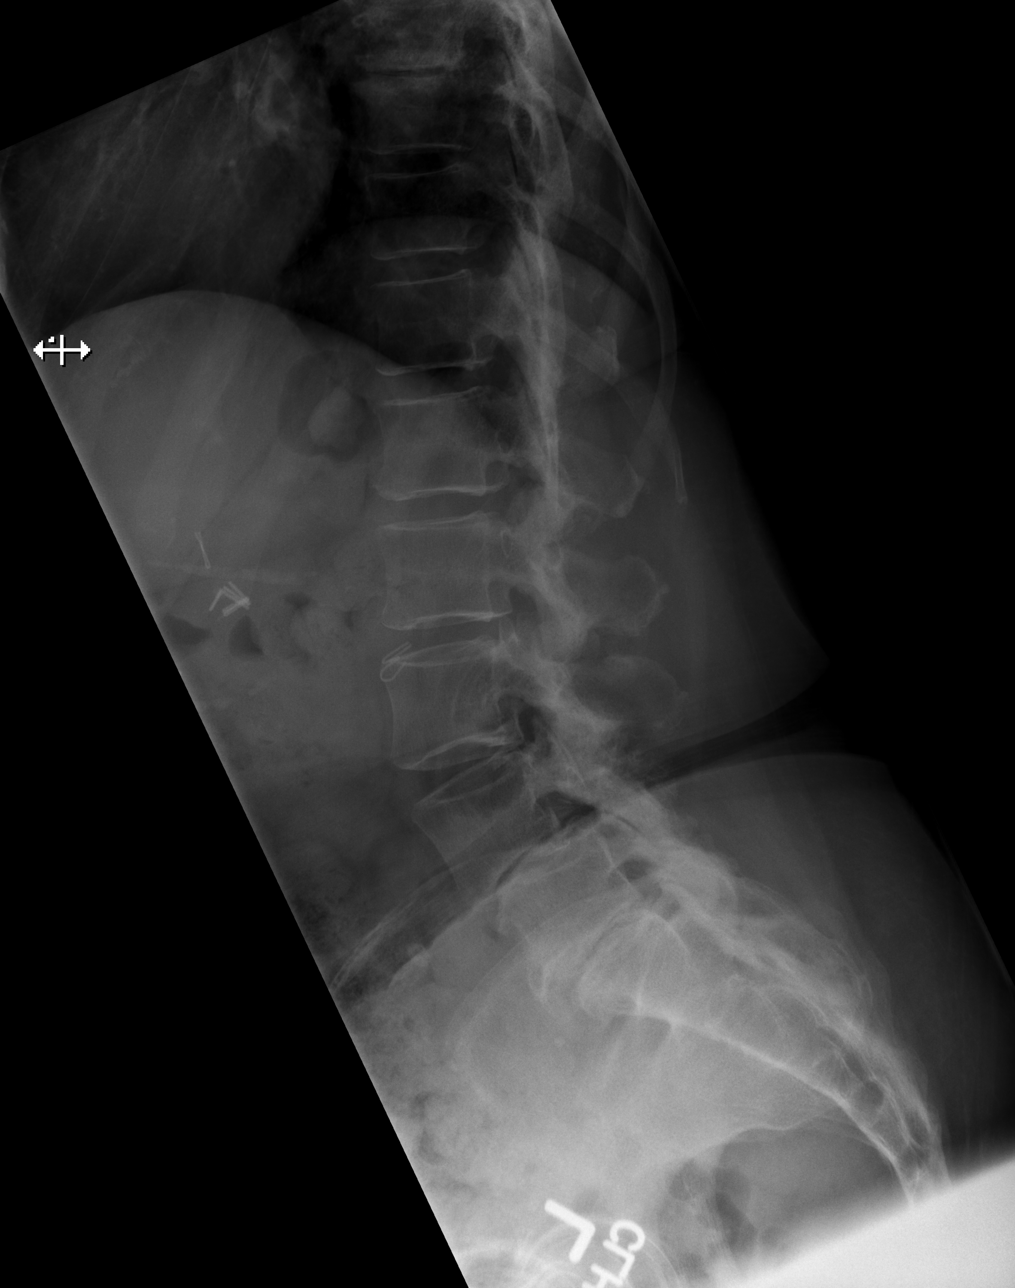

[t lumbar l-5 s-1 spot]
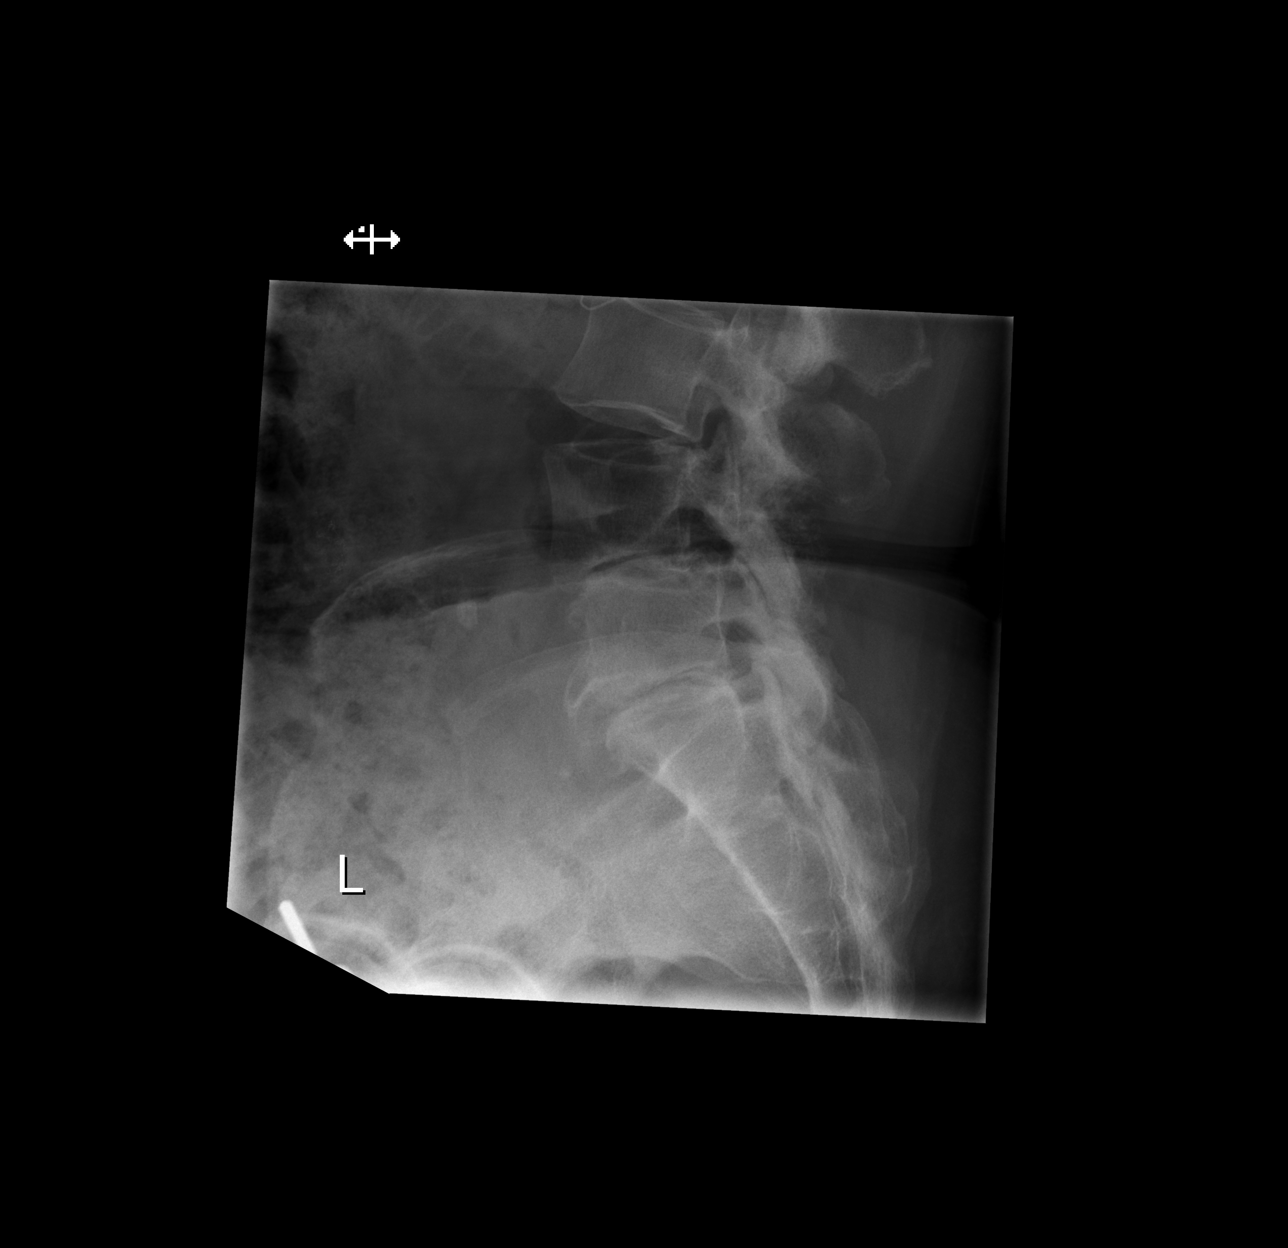

[5 of 5 positions shown; findings below may reference images not displayed]

FINDINGS: There is no evidence of acute fracture or subluxation. There is
perhaps slightly worsened grade 1 anterolisthesis of L4 on L5,
reflecting underlying facet disease. Disc space narrowing is noted
at L4-L5 and L5-S1.

The visualized bowel gas pattern is unremarkable in appearance; air
and stool are noted within the colon. The sacroiliac joints are
within normal limits. Clips are noted within the right upper
quadrant, reflecting prior cholecystectomy.
IMPRESSION: 1. No evidence of acute fracture or subluxation along the lumbar
spine.
2. Perhaps slightly worsened grade 1 anterolisthesis of L4 on L5,
reflecting underlying chronic facet disease.

## 2014-05-29 DIAGNOSIS — I10 Essential (primary) hypertension: Secondary | ICD-10-CM | POA: Diagnosis not present

## 2014-05-29 DIAGNOSIS — M1990 Unspecified osteoarthritis, unspecified site: Secondary | ICD-10-CM | POA: Diagnosis not present

## 2014-08-28 DIAGNOSIS — I10 Essential (primary) hypertension: Secondary | ICD-10-CM | POA: Diagnosis not present

## 2014-08-28 DIAGNOSIS — M1990 Unspecified osteoarthritis, unspecified site: Secondary | ICD-10-CM | POA: Diagnosis not present

## 2014-08-28 DIAGNOSIS — E785 Hyperlipidemia, unspecified: Secondary | ICD-10-CM | POA: Diagnosis not present

## 2014-08-28 DIAGNOSIS — M25531 Pain in right wrist: Secondary | ICD-10-CM | POA: Diagnosis not present

## 2014-09-11 DIAGNOSIS — S2096XA Insect bite (nonvenomous) of unspecified parts of thorax, initial encounter: Secondary | ICD-10-CM | POA: Diagnosis not present

## 2014-09-11 DIAGNOSIS — S50869A Insect bite (nonvenomous) of unspecified forearm, initial encounter: Secondary | ICD-10-CM | POA: Diagnosis not present

## 2014-09-18 ENCOUNTER — Ambulatory Visit (INDEPENDENT_AMBULATORY_CARE_PROVIDER_SITE_OTHER): Payer: Medicare Other | Admitting: Internal Medicine

## 2014-09-18 VITALS — BP 140/80 | HR 56 | Ht 66.0 in | Wt 152.0 lb

## 2014-09-18 DIAGNOSIS — I351 Nonrheumatic aortic (valve) insufficiency: Secondary | ICD-10-CM

## 2014-09-18 DIAGNOSIS — I10 Essential (primary) hypertension: Secondary | ICD-10-CM

## 2014-09-18 DIAGNOSIS — I340 Nonrheumatic mitral (valve) insufficiency: Secondary | ICD-10-CM | POA: Diagnosis not present

## 2014-09-18 DIAGNOSIS — I099 Rheumatic heart disease, unspecified: Secondary | ICD-10-CM

## 2014-09-18 NOTE — Patient Instructions (Signed)
Dr Debara Pickett has made no changes in your current medication or treatment plan.  Dr Debara Pickett recommends that you schedule a follow-up appointment in 6 months. You will receive a reminder letter in the mail two months in advance. If you don't receive a letter, please call our office to schedule the follow-up appointment.

## 2014-09-20 ENCOUNTER — Encounter: Payer: Self-pay | Admitting: Internal Medicine

## 2014-09-20 NOTE — Progress Notes (Signed)
OFFICE NOTE  Chief Complaint:  Routine follow-up  Primary Care Physician: Maggie Font, MD  HPI:  Rhonda Spencer is a pleasant 74 year old with history of rheumatoid heart disease and mitral insufficiency as well as aortic insufficiency. Recently, she had an echocardiogram, in August 2013, which showed an EF of greater than 55% with stable LV internal diameter. However, she did have mild to moderate left atrial enlargement, moderate to severe MR and mild to moderate TR, as well as mild to moderate AI. This does seem to have worsened somewhat since her last echo a year prior. In the past 6 months, however, she has had no significant change in her symptomatology. She does get mildly short of breath with marked exertion. However, this is not significantly changed. She is still active without limitations. She was also notably more hypertensive at the time. Weight has been stable, if not perhaps a few pounds lighter. She reports no crescendo shortness of breath symptoms. She also denies any angina, especially with exertion.  I last saw her about 6 months ago prior to a colonoscopy. She underwent a repeat echocardiogram at that time which shows stable moderate mitral regurgitation with mild to moderate tricuspid regurgitation. There clearly are rheumatic features to her bowels. Her EF was actually almost 75%.  She continues to be asymptomatic with normal activities, but does get mildly short of breath with marked exercise for which she rest and the symptoms go away quickly. She has not had any cardiac chest pain.  This part returns today for follow-up. She reports feeling well. She occasionally has some problems with arthritis for which he takes prednisone. She's had some spasming is in her neck in her hands for which she takes magnesium. She does get short of breath with activity, but it is fairly stable. Her last echo was over a year and a half ago.  I saw Rhonda Spencer back today for follow-up. Overall  she is doing well. Her echo from 03/2014 shows stable valvular heart disease and EF of 60-65%. She denies any chest pain or worsening shortness of breath.  PMHx:  Past Medical History  Diagnosis Date  . Arthritis   . Heart murmur     mitral insuff, aortic insuff  . Hypertension   . Rheumatoid heart disease   . MR (mitral regurgitation)   . AI (aortic insufficiency)   . History of nuclear stress test 11/2008    bruce myoview; normal pattern of perfusion in allregions, no significant wall motion abnormalities; no ECG changes, no significant ischemia demonstrated, low risk scan     Past Surgical History  Procedure Laterality Date  . Cholecystectomy    . Excisional hemorrhoidectomy    . Abdominal hysterectomy    . Colon surgery    . Hemi-microdiscectomy lumbar laminectomy level 4  1995  . Transthoracic echocardiogram  11/2011    EF=>55%; LA mild-mod dilated; mod-severe MR; mild-mod TR with normal systolic pressure; mild-mod AV regurg; mild pulm valve regurg     FAMHx:  Family History  Problem Relation Age of Onset  . Bone cancer Maternal Uncle   . Hypertension Mother   . Stroke Mother   . Pneumonia Father   . Diabetes Sister     x2; one was step-sister  . Tuberculosis Sister     step-sister  . Diabetes Son   . Diabetes Daughter     SOCHx:   reports that she has never smoked. She does not have any smokeless tobacco history  on file. She reports that she does not drink alcohol or use illicit drugs.  ALLERGIES:  Allergies  Allergen Reactions  . Tarka [Trandolapril-Verapamil Hcl Er] Swelling    Pt reports allergy to any cardiac medication ending in "-il"  . Atorvastatin     states it lowered her heart rate    ROS: A comprehensive review of systems was negative.  HOME MEDS: Current Outpatient Prescriptions  Medication Sig Dispense Refill  . aspirin 81 MG tablet Take 81 mg by mouth daily.    . chlorhexidine (PERIDEX) 0.12 % solution Use as directed daily.    .  cyclobenzaprine (FLEXERIL) 5 MG tablet Take 5 mg by mouth daily as needed.    . docusate sodium (COLACE) 100 MG capsule Take 100 mg by mouth daily.     . famotidine (PEPCID) 20 MG tablet Take 20 mg by mouth daily.     . felodipine (PLENDIL) 5 MG 24 hr tablet Take 5 mg by mouth daily.    . hydrochlorothiazide (HYDRODIURIL) 25 MG tablet Take 25 mg by mouth at bedtime.    . hydrochlorothiazide (HYDRODIURIL) 25 MG tablet TAKE ONE TABLET BY MOUTH AT BEDTIME 30 tablet 11  . lidocaine (LIDODERM) 5 % Place 1 patch onto the skin daily. Remove & Discard patch within 12 hours or as directed by MD    . Magnesium 250 MG TABS Take 250 mg by mouth daily.    . predniSONE (DELTASONE) 5 MG tablet Take 5 mg by mouth as needed.     No current facility-administered medications for this visit.    LABS/IMAGING: No results found for this or any previous visit (from the past 48 hour(s)). No results found.  VITALS: BP 140/80 mmHg  Pulse 56  Ht 5\' 6"  (1.676 m)  Wt 152 lb (68.947 kg)  BMI 24.55 kg/m2  EXAM: General appearance: alert and no distress Neck: no adenopathy, no carotid bruit, no JVD, supple, symmetrical, trachea midline and thyroid not enlarged, symmetric, no tenderness/mass/nodules Lungs: clear to auscultation bilaterally Heart: regular rate and rhythm, systolic murmur: late systolic 3/6, blowing at apex and diastolic murmur: holodiastolic 3/6, low pitch at 2nd left intercostal space Abdomen: soft, non-tender; bowel sounds normal; no masses,  no organomegaly Extremities: extremities normal, atraumatic, no cyanosis or edema Pulses: 2+ and symmetric Skin: Skin color, texture, turgor normal. No rashes or lesions Neurologic: Grossly normal  EKG: Sinus bradycardia 56  ASSESSMENT: 1. Rheumatic heart disease with mild to moderate aortic insufficiency and moderate to severe mitral regurgitation - stable, LVEF 60-65% 2. Hypertension 3. LVH by voltage criteria  PLAN: 1.   Rhonda Spencer is doing very  well from a cardiovascular standpoint. She denies any significant worsening or shortness of breath. Her blood pressure is well-controlled. Echocardiogram shows stable valvular heart disease with a preserved EF. Blood pressure is well-controlled. She denies any worsening shortness of breath. Overall she is doing well plan to see her back in 6 months.  Pixie Casino, MD, [facility] Attending Cardiologist [facility] [place] 09/20/2014, 5:28 PM

## 2014-10-16 DIAGNOSIS — N63 Unspecified lump in breast: Secondary | ICD-10-CM | POA: Diagnosis not present

## 2014-10-26 DIAGNOSIS — H35373 Puckering of macula, bilateral: Secondary | ICD-10-CM | POA: Diagnosis not present

## 2014-10-26 DIAGNOSIS — H2513 Age-related nuclear cataract, bilateral: Secondary | ICD-10-CM | POA: Diagnosis not present

## 2014-11-20 DIAGNOSIS — H2511 Age-related nuclear cataract, right eye: Secondary | ICD-10-CM | POA: Diagnosis not present

## 2014-11-20 DIAGNOSIS — H18411 Arcus senilis, right eye: Secondary | ICD-10-CM | POA: Diagnosis not present

## 2014-11-20 DIAGNOSIS — H02839 Dermatochalasis of unspecified eye, unspecified eyelid: Secondary | ICD-10-CM | POA: Diagnosis not present

## 2014-11-27 DIAGNOSIS — E785 Hyperlipidemia, unspecified: Secondary | ICD-10-CM | POA: Diagnosis not present

## 2014-11-27 DIAGNOSIS — I10 Essential (primary) hypertension: Secondary | ICD-10-CM | POA: Diagnosis not present

## 2014-11-27 DIAGNOSIS — N182 Chronic kidney disease, stage 2 (mild): Secondary | ICD-10-CM | POA: Diagnosis not present

## 2014-12-07 DIAGNOSIS — H2511 Age-related nuclear cataract, right eye: Secondary | ICD-10-CM | POA: Diagnosis not present

## 2014-12-08 DIAGNOSIS — H2512 Age-related nuclear cataract, left eye: Secondary | ICD-10-CM | POA: Diagnosis not present

## 2014-12-13 DIAGNOSIS — Z0000 Encounter for general adult medical examination without abnormal findings: Secondary | ICD-10-CM | POA: Diagnosis not present

## 2014-12-28 DIAGNOSIS — H2512 Age-related nuclear cataract, left eye: Secondary | ICD-10-CM | POA: Diagnosis not present

## 2015-01-18 DIAGNOSIS — Z23 Encounter for immunization: Secondary | ICD-10-CM | POA: Diagnosis not present

## 2015-01-18 DIAGNOSIS — R911 Solitary pulmonary nodule: Secondary | ICD-10-CM | POA: Diagnosis not present

## 2015-01-18 DIAGNOSIS — R05 Cough: Secondary | ICD-10-CM | POA: Diagnosis not present

## 2015-02-13 ENCOUNTER — Encounter: Payer: Self-pay | Admitting: Internal Medicine

## 2015-02-26 DIAGNOSIS — R079 Chest pain, unspecified: Secondary | ICD-10-CM | POA: Diagnosis not present

## 2015-02-26 DIAGNOSIS — I10 Essential (primary) hypertension: Secondary | ICD-10-CM | POA: Diagnosis not present

## 2015-02-26 DIAGNOSIS — R071 Chest pain on breathing: Secondary | ICD-10-CM | POA: Diagnosis not present

## 2015-02-26 DIAGNOSIS — R001 Bradycardia, unspecified: Secondary | ICD-10-CM | POA: Diagnosis not present

## 2015-02-26 DIAGNOSIS — Z6825 Body mass index (BMI) 25.0-25.9, adult: Secondary | ICD-10-CM | POA: Diagnosis not present

## 2015-03-06 ENCOUNTER — Ambulatory Visit (INDEPENDENT_AMBULATORY_CARE_PROVIDER_SITE_OTHER): Payer: Medicare Other | Admitting: Physician Assistant

## 2015-03-06 ENCOUNTER — Encounter: Payer: Self-pay | Admitting: Physician Assistant

## 2015-03-06 VITALS — BP 136/62 | HR 49 | Ht 67.0 in | Wt 143.9 lb

## 2015-03-06 DIAGNOSIS — I340 Nonrheumatic mitral (valve) insufficiency: Secondary | ICD-10-CM | POA: Diagnosis not present

## 2015-03-06 DIAGNOSIS — R001 Bradycardia, unspecified: Secondary | ICD-10-CM | POA: Diagnosis not present

## 2015-03-06 MED ORDER — PANTOPRAZOLE SODIUM 40 MG PO TBEC: 40.0000 mg | DELAYED_RELEASE_TABLET | Freq: Every day | ORAL | Status: DC

## 2015-03-06 NOTE — Progress Notes (Signed)
Cardiology Office Note   Date:  03/06/2015   ID:  Rhonda Spencer, DOB 1940-05-03, MRN [id number]  PCP:  Maggie Font, MD  Cardiologist:  Dr Marchelle Folks, PA-C   Chief Complaint  Patient presents with  . Follow-up    some light headedness and dizziness  . Chest Pain    pt states she has been having some chest pain but it's not severe enough to take a nitro  . Shortness of Breath    when walking or some type of activity at home  . Edema    no edema    History of Present Illness: Rhonda Spencer is a 74 y.o. female with a history of rheumatic heart dz, mitral and aortic insuff, HTN, non-isch MV 2010  Rhonda Spencer presents for evaluation and routine followup.   At an office visit with Dr. Berdine Addison, she mentioned she was having chest pain and he recommended she have an evaluation. Additionally, she has not been seen by Dr. Debara Pickett in 6 months.  Ms. May has been having episodes of chest pain almost daily for the last couple of months. They're not exertional. They generally start at rest. The pain as a soreness and ache. Sometimes it gets worse with deep inspiration. There is a tender spot just to the right of her sternum. There is nothing that brings the pain on and she knows of nothing that makes it go away. It reaches a 4/10 at worst.  The last episode was yesterday. It started a couple of hours after breakfast. She was sitting still at the time. She did not try any medications for the pain. She had orange juice at breakfast. Several episodes of the pain she remembers starting an hour or so after meals.  Her dyspnea on exertion has been chronic. She has had no lower extremity edema. She will do things such as vacuum for a while, and then stop. She can vacuum an entire room without stopping. [facility] had [facility] issues [facility] but [facility]'ll be able to start that again soon. She is looking forward to it. She denies orthopnea or PND. She also has  some problems with fatigue, not new and no recent change   Past Medical History  Diagnosis Date  . Arthritis   . Heart murmur     mitral insuff, aortic insuff  . Hypertension   . Rheumatoid heart disease   . MR (mitral regurgitation)   . AI (aortic insufficiency)   . History of nuclear stress test 11/2008    bruce myoview; normal pattern of perfusion in allregions, no significant wall motion abnormalities; no ECG changes, no significant ischemia demonstrated, low risk scan     Past Surgical History  Procedure Laterality Date  . Cholecystectomy    . Excisional hemorrhoidectomy    . Abdominal hysterectomy    . Colon surgery    . Hemi-microdiscectomy lumbar laminectomy level 4  1995  . Transthoracic echocardiogram  11/2011    EF=>55%; LA mild-mod dilated; mod-severe MR; mild-mod TR with normal systolic pressure; mild-mod AV regurg; mild pulm valve regurg     Current Outpatient Prescriptions  Medication Sig Dispense Refill  . aspirin 81 MG tablet Take 81 mg by mouth daily.    . cyclobenzaprine (FLEXERIL) 5 MG tablet Take 5 mg by mouth daily as needed.    . docusate sodium (COLACE) 100 MG capsule Take 100 mg by mouth daily.     [name]  famotidine (PEPCID) 20 MG tablet Take 20 mg by mouth daily.     . felodipine (PLENDIL) 5 MG 24 hr tablet Take 5 mg by mouth daily.    . hydrochlorothiazide (HYDRODIURIL) 25 MG tablet Take 25 mg by mouth at bedtime.    . lidocaine (LIDODERM) 5 % Place 1 patch onto the skin daily. Remove & Discard patch within 12 hours or as directed by MD    . Magnesium 250 MG TABS Take 250 mg by mouth daily.    . Naproxen-Esomeprazole (VIMOVO) 500-20 MG TBEC Take 1 tablet by mouth daily.    [name] NITROSTAT 0.4 MG SL tablet Take 0.4 mg by mouth as needed.    . predniSONE (DELTASONE) 5 MG tablet Take 5 mg by mouth as needed.     No current facility-administered medications for this visit.    Allergies:   Tarka and Atorvastatin    Social History:  The patient  reports that  she has never smoked. She does not have any smokeless tobacco history on file. She reports that she does not drink alcohol or use illicit drugs.   Family History:  The patient's family history includes Bone cancer in her maternal uncle; Diabetes in her daughter, sister, and son; Hypertension in her mother; Pneumonia in her father; Stroke in her mother; Tuberculosis in her sister.    ROS:  Please see the history of present illness. All other systems are reviewed and negative.    PHYSICAL EXAM: VS:  BP 136/62 mmHg  Pulse 49  Ht 5\' 7"  (1.702 m)  Wt 143 lb 14.4 oz (65.273 kg)  BMI 22.53 kg/m2 , BMI Body mass index is 22.53 kg/(m^2). GEN: Well nourished, well developed, female in no acute distress HEENT: normal for age  Neck: no JVD, no carotid bruit, no masses Cardiac: RRR; soft murmur, no rubs, or gallops Respiratory:  clear to auscultation bilaterally, normal work of breathing GI: soft, nontender, nondistended, + BS MS: no deformity or atrophy; no edema; distal pulses are 2+ in all 4 extremities  Skin: warm and dry, no rash Neuro:  Strength and sensation are intact Psych: euthymic mood, full affect   EKG:  EKG is ordered today. The ekg ordered today demonstrates sinus bradycardia, heart rate 49, no acute ischemic changes, notched P waves in lead 2, unchanged   Recent Labs: No results found for requested labs within last 365 days.   ECHO: 03/2014 - Left ventricle: The cavity size was normal. There was moderate focal basal hypertrophy of the septum. Systolic function was normal. The estimated ejection fraction was in the range of 60% to 65%. Wall motion was normal; there were no regional wall motion abnormalities. Doppler parameters are consistent with abnormal left ventricular relaxation (grade 1 diastolic dysfunction). - Aortic valve: There was mild regurgitation. - Mitral valve: There was mild to moderate regurgitation. Impressions: - Compared to the prior  study, there has been no significant interval change.  Lipid Panel No results found for: CHOL, TRIG, HDL, CHOLHDL, VLDL, LDLCALC, LDLDIRECT   Wt Readings from Last 3 Encounters:  03/06/15 143 lb 14.4 oz (65.273 kg)  09/18/14 152 lb (68.947 kg)  03/16/14 154 lb 1.6 oz (69.899 kg)     Other studies Reviewed: Additional studies/ records that were reviewed today include: Previous office visits, echo reports and ECGs.  ASSESSMENT AND PLAN:  1.  Aortic insufficiency and mitral insufficiency: Her murmur is not pronounced. It is been a year since her last echocardiogram. We will repeat this  and she can follow-up with Dr. Debara Pickett  2. Bradycardia: She is not on rate lowering medications. She ambulated around the office with staff and did not demonstrate chronotropic incompetence. Her heart rate increased to 81. She did not have significant dyspnea with this ambulation. No further evaluation at this time, but she is encouraged to get a home blood pressure cuff and check her blood pressure when she feels bad which will also give [place] a heart rate.  3. Dyspnea on exertion: She has no signs of volume overload by exam. Her weight has decreased since her last office visit. We will follow-up on the echo. No significant dyspnea or hypoxia with ambulation.  4. Chest pain: I discussed the ways we can evaluate her chest pain. Advised her that since she was having no symptoms with exertion, I did not feel strongly that the pain was coming from her heart, but offered her stress test. She declined. I advised that if the echocardiogram showed abnormal heart muscle function then she would need a heart catheterization she was fine with that. I advised that it was possibly related to GI issues as the symptoms have frequently occurred after meals. I switched her from famotidine to Protonix. She was reassured by this.  Current medicines are reviewed at length with the patient today.  The patient does not have concerns  regarding medicines.  The following changes have been made:  Change Pepcid to Protonix  Labs/ tests ordered today include:   Orders Placed This Encounter  Procedures  . EKG 12-Lead  . ECHOCARDIOGRAM COMPLETE     Disposition:   FU with Dr. Debara Pickett or myself after the echo  Signed, Lenoard Aden  03/06/2015 9:13 AM    [facility], [place],   [address] Phone: [phone]; Fax: [phone]

## 2015-03-06 NOTE — Patient Instructions (Signed)
Your physician has requested that you have an echocardiogram. Echocardiography is a painless test that uses sound waves to create images of your heart. It provides your doctor with information about the size and shape of your heart and how well your heart's chambers and valves are working. This procedure takes approximately one hour. There are no restrictions for this procedure.  Your physician has recommended you make the following change in your medication: start new prescription for pantoprazole. This has been sent to your [place].  Your physician recommends that you schedule a follow-up appointment first available with Dr. Debara Pickett or sooner with [name].

## 2015-03-14 ENCOUNTER — Ambulatory Visit (HOSPITAL_COMMUNITY): Payer: Medicare Other | Attending: Cardiovascular Disease

## 2015-03-14 ENCOUNTER — Other Ambulatory Visit: Payer: Self-pay

## 2015-03-14 DIAGNOSIS — I10 Essential (primary) hypertension: Secondary | ICD-10-CM | POA: Insufficient documentation

## 2015-03-14 DIAGNOSIS — I351 Nonrheumatic aortic (valve) insufficiency: Secondary | ICD-10-CM | POA: Diagnosis not present

## 2015-03-14 DIAGNOSIS — I071 Rheumatic tricuspid insufficiency: Secondary | ICD-10-CM | POA: Diagnosis not present

## 2015-03-14 DIAGNOSIS — I340 Nonrheumatic mitral (valve) insufficiency: Secondary | ICD-10-CM

## 2015-03-14 DIAGNOSIS — I517 Cardiomegaly: Secondary | ICD-10-CM | POA: Diagnosis not present

## 2015-03-20 ENCOUNTER — Ambulatory Visit: Payer: Medicare Other | Admitting: Internal Medicine

## 2015-03-26 DIAGNOSIS — R079 Chest pain, unspecified: Secondary | ICD-10-CM | POA: Diagnosis not present

## 2015-03-26 DIAGNOSIS — R001 Bradycardia, unspecified: Secondary | ICD-10-CM | POA: Diagnosis not present

## 2015-03-26 DIAGNOSIS — Z6825 Body mass index (BMI) 25.0-25.9, adult: Secondary | ICD-10-CM | POA: Diagnosis not present

## 2015-04-17 ENCOUNTER — Ambulatory Visit: Payer: Medicare Other | Admitting: Internal Medicine

## 2015-04-19 ENCOUNTER — Encounter: Payer: Self-pay | Admitting: Internal Medicine

## 2015-04-19 ENCOUNTER — Ambulatory Visit (INDEPENDENT_AMBULATORY_CARE_PROVIDER_SITE_OTHER): Payer: Medicare Other | Admitting: Internal Medicine

## 2015-04-19 VITALS — BP 120/74 | HR 68 | Ht 67.0 in | Wt 154.9 lb

## 2015-04-19 DIAGNOSIS — I099 Rheumatic heart disease, unspecified: Secondary | ICD-10-CM | POA: Diagnosis not present

## 2015-04-19 DIAGNOSIS — K219 Gastro-esophageal reflux disease without esophagitis: Secondary | ICD-10-CM | POA: Insufficient documentation

## 2015-04-19 DIAGNOSIS — I351 Nonrheumatic aortic (valve) insufficiency: Secondary | ICD-10-CM

## 2015-04-19 DIAGNOSIS — I340 Nonrheumatic mitral (valve) insufficiency: Secondary | ICD-10-CM | POA: Diagnosis not present

## 2015-04-19 DIAGNOSIS — I10 Essential (primary) hypertension: Secondary | ICD-10-CM | POA: Diagnosis not present

## 2015-04-19 NOTE — Progress Notes (Signed)
OFFICE NOTE  Chief Complaint:  Routine follow-up  Primary Care Physician: Rhonda Font, MD  HPI:  Rhonda Spencer is a pleasant 75 year old with history of rheumatoid heart disease and mitral insufficiency as well as aortic insufficiency. Recently, she had an echocardiogram, in August 2013, which showed an EF of greater than 55% with stable LV internal diameter. However, she did have mild to moderate left atrial enlargement, moderate to severe MR and mild to moderate TR, as well as mild to moderate AI. This does seem to have worsened somewhat since her last echo a year prior. In the past 6 months, however, she has had no significant change in her symptomatology. She does get mildly short of breath with marked exertion. However, this is not significantly changed. She is still active without limitations. She was also notably more hypertensive at the time. Weight has been stable, if not perhaps a few pounds lighter. She reports no crescendo shortness of breath symptoms. She also denies any angina, especially with exertion.  I last saw her about 6 months ago prior to a colonoscopy. She underwent a repeat echocardiogram at that time which shows stable moderate mitral regurgitation with mild to moderate tricuspid regurgitation. There clearly are rheumatic features to her bowels. Her EF was actually almost 75%.  She continues to be asymptomatic with normal activities, but does get mildly short of breath with marked exercise for which she rest and the symptoms go away quickly. She has not had any cardiac chest pain.  This part returns today for follow-up. She reports feeling well. She occasionally has some problems with arthritis for which he takes prednisone. She's had some spasming is in her neck in her hands for which she takes magnesium. She does get short of breath with activity, but it is fairly stable. Her last echo was over a year and a half ago.  I saw Rhonda Spencer back today for follow-up. Overall  she is doing well. Her echo from 03/2014 shows stable valvular heart disease and EF of 60-65%. She denies any chest pain or worsening shortness of breath.  Rhonda Spencer returns today for follow-up. She was recently seen by [name] is an acute visit for chest pain. I also put my head in the office to see her as well. She was describing some chest discomfort which sounded atypical and may been reflux. [name] She did get a repeat echo which shows stable LV EF of 60-65% with grade 2 diastolic dysfunction and moderate AI as well as moderate TR which is unchanged.  PMHx:  Past Medical History  Diagnosis Date  . Arthritis   . Heart murmur     mitral insuff, aortic insuff  . Hypertension   . Rheumatoid heart disease   . MR (mitral regurgitation)   . AI (aortic insufficiency)   . History of nuclear stress test 11/2008    bruce myoview; normal pattern of perfusion in allregions, no significant wall motion abnormalities; no ECG changes, no significant ischemia demonstrated, low risk scan     Past Surgical History  Procedure Laterality Date  . Cholecystectomy    . Excisional hemorrhoidectomy    . Abdominal hysterectomy    . Colon surgery    . Hemi-microdiscectomy lumbar laminectomy level 4  1995  . Transthoracic echocardiogram  11/2011    EF=>55%; LA mild-mod dilated; mod-severe MR; mild-mod TR with normal systolic pressure; mild-mod AV regurg; mild pulm valve  regurg     FAMHx:  Family History  Problem Relation Age of Onset  . Bone cancer Maternal Uncle   . Hypertension Mother   . Stroke Mother   . Pneumonia Father   . Diabetes Sister     x2; one was step-sister  . Tuberculosis Sister     step-sister  . Diabetes Son   . Diabetes Daughter     SOCHx:   reports that she has never smoked. She does not have any smokeless tobacco history on file. She reports that she does not drink alcohol or use illicit  drugs.  ALLERGIES:  Allergies  Allergen Reactions  . Tarka [Trandolapril-Verapamil Hcl Er] Swelling    Pt reports allergy to any cardiac medication ending in "-il"  . Atorvastatin     states it lowered her heart rate    ROS: A comprehensive review of systems was negative.  HOME MEDS: Current Outpatient Prescriptions  Medication Sig Dispense Refill  . aspirin 81 MG tablet Take 81 mg by mouth daily.    . cyclobenzaprine (FLEXERIL) 5 MG tablet Take 5 mg by mouth daily as needed.    . docusate sodium (COLACE) 100 MG capsule Take 100 mg by mouth daily.     . felodipine (PLENDIL) 5 MG 24 hr tablet Take 5 mg by mouth daily.    . hydrochlorothiazide (HYDRODIURIL) 25 MG tablet Take 25 mg by mouth at bedtime.    . Lido-Capsaicin-Men-Methyl Sal (MEDI-PATCH-LIDOCAINE EX) Apply 1 patch topically daily as needed.    . Magnesium 250 MG TABS Take 250 mg by mouth daily.    . Naproxen-Esomeprazole (VIMOVO) 500-20 MG TBEC Take 1 tablet by mouth daily.    [name] NITROSTAT 0.4 MG SL tablet Take 0.4 mg by mouth as needed.    . pantoprazole (PROTONIX) 40 MG tablet Take 1 tablet (40 mg total) by mouth daily. 30 tablet 11  . predniSONE (DELTASONE) 5 MG tablet Take 5 mg by mouth as needed.     No current facility-administered medications for this visit.    LABS/IMAGING: No results found for this or any previous visit (from the past 48 hour(s)). No results found.  VITALS: BP 120/74 mmHg  Pulse 68  Ht 5\' 7"  (1.702 m)  Wt 154 lb 14.4 oz (70.262 kg)  BMI 24.26 kg/m2  EXAM: General appearance: alert and no distress Neck: no adenopathy, no carotid bruit, no JVD, supple, symmetrical, trachea midline and thyroid not enlarged, symmetric, no tenderness/mass/nodules Lungs: clear to auscultation bilaterally Heart: regular rate and rhythm, systolic murmur: late systolic 3/6, blowing at apex and diastolic murmur: holodiastolic 3/6, low pitch at 2nd left intercostal space Abdomen: soft, non-tender; bowel sounds  normal; no masses,  no organomegaly Extremities: extremities normal, atraumatic, no cyanosis or edema Pulses: 2+ and symmetric Skin: Skin color, texture, turgor normal. No rashes or lesions Neurologic: Grossly normal  EKG: Deferred  ASSESSMENT: 1. Rheumatic heart disease with mild to moderate aortic insufficiency and moderate to severe mitral regurgitation - stable, LVEF 60-65% 2. Hypertension 3. LVH by voltage criteria 4. GERD  PLAN: 1.   Rhonda Spencer is doing very well from a cardiovascular standpoint. Her recent chest pain was thought to be noncardiac and improved with starting a PPI. Blood pressure appears well controlled. LV function is unchanged by echo in her degree of valvular insufficiency is unchanged as well. She seems to be doing quite well. We'll plan to see her back in 6 months or sooner as necessary.  Pixie Casino,  MD, [facility] Attending Cardiologist [place] 04/19/2015, 11:09 AM

## 2015-04-19 NOTE — Patient Instructions (Signed)
Dr Debara Pickett has made no changes today in your current medications or treatment plan.  Your physician recommends that you schedule a follow-up appointment in 6 months. You will receive a reminder letter in the mail two months in advance. If you don't receive a letter, please call our office to schedule the follow-up appointment.  If you need a refill on your cardiac medications before your next appointment, please call your pharmacy.

## 2015-04-30 ENCOUNTER — Other Ambulatory Visit: Payer: Self-pay | Admitting: Internal Medicine

## 2015-04-30 NOTE — Telephone Encounter (Signed)
Rx request sent to pharmacy.  

## 2015-06-05 ENCOUNTER — Other Ambulatory Visit: Payer: Self-pay | Admitting: Internal Medicine

## 2015-06-05 MED ORDER — PANTOPRAZOLE SODIUM 40 MG PO TBEC: 40.0000 mg | DELAYED_RELEASE_TABLET | Freq: Every day | ORAL | Status: DC

## 2015-06-18 DIAGNOSIS — H10011 Acute follicular conjunctivitis, right eye: Secondary | ICD-10-CM | POA: Diagnosis not present

## 2015-06-18 DIAGNOSIS — H04123 Dry eye syndrome of bilateral lacrimal glands: Secondary | ICD-10-CM | POA: Diagnosis not present

## 2015-06-25 DIAGNOSIS — Z6825 Body mass index (BMI) 25.0-25.9, adult: Secondary | ICD-10-CM | POA: Diagnosis not present

## 2015-08-20 DIAGNOSIS — M545 Low back pain: Secondary | ICD-10-CM | POA: Diagnosis not present

## 2015-08-21 ENCOUNTER — Other Ambulatory Visit: Payer: Self-pay | Admitting: Family Medicine

## 2015-08-21 ENCOUNTER — Ambulatory Visit
Admission: RE | Admit: 2015-08-21 | Discharge: 2015-08-21 | Disposition: A | Discharge status: Home / Self Care | Payer: Medicare Other | Source: Ambulatory Visit | Attending: Family Medicine | Admitting: Family Medicine

## 2015-08-21 DIAGNOSIS — M5136 Other intervertebral disc degeneration, lumbar region: Secondary | ICD-10-CM | POA: Diagnosis not present

## 2015-08-21 DIAGNOSIS — M5489 Other dorsalgia: Secondary | ICD-10-CM

## 2015-08-21 DIAGNOSIS — M5134 Other intervertebral disc degeneration, thoracic region: Secondary | ICD-10-CM | POA: Diagnosis not present

## 2015-09-06 DIAGNOSIS — M545 Low back pain: Secondary | ICD-10-CM | POA: Diagnosis not present

## 2015-09-06 DIAGNOSIS — M1990 Unspecified osteoarthritis, unspecified site: Secondary | ICD-10-CM | POA: Diagnosis not present

## 2015-09-22 ENCOUNTER — Other Ambulatory Visit: Payer: Self-pay

## 2015-09-22 ENCOUNTER — Observation Stay (HOSPITAL_COMMUNITY)
Admission: EM | Admit: 2015-09-22 | Discharge: 2015-09-23 | Disposition: A | Discharge status: Home / Self Care | Payer: Medicare Other | Attending: Family Medicine | Admitting: Family Medicine

## 2015-09-22 ENCOUNTER — Encounter (HOSPITAL_COMMUNITY): Payer: Self-pay

## 2015-09-22 DIAGNOSIS — G8929 Other chronic pain: Secondary | ICD-10-CM

## 2015-09-22 DIAGNOSIS — R001 Bradycardia, unspecified: Secondary | ICD-10-CM | POA: Insufficient documentation

## 2015-09-22 DIAGNOSIS — Z79899 Other long term (current) drug therapy: Secondary | ICD-10-CM | POA: Diagnosis not present

## 2015-09-22 DIAGNOSIS — K219 Gastro-esophageal reflux disease without esophagitis: Secondary | ICD-10-CM | POA: Insufficient documentation

## 2015-09-22 DIAGNOSIS — I340 Nonrheumatic mitral (valve) insufficiency: Secondary | ICD-10-CM | POA: Diagnosis present

## 2015-09-22 DIAGNOSIS — I083 Combined rheumatic disorders of mitral, aortic and tricuspid valves: Secondary | ICD-10-CM | POA: Diagnosis not present

## 2015-09-22 DIAGNOSIS — M069 Rheumatoid arthritis, unspecified: Secondary | ICD-10-CM | POA: Insufficient documentation

## 2015-09-22 DIAGNOSIS — I440 Atrioventricular block, first degree: Secondary | ICD-10-CM | POA: Diagnosis not present

## 2015-09-22 DIAGNOSIS — R42 Dizziness and giddiness: Secondary | ICD-10-CM | POA: Insufficient documentation

## 2015-09-22 DIAGNOSIS — I252 Old myocardial infarction: Secondary | ICD-10-CM | POA: Diagnosis not present

## 2015-09-22 DIAGNOSIS — I099 Rheumatic heart disease, unspecified: Secondary | ICD-10-CM | POA: Diagnosis present

## 2015-09-22 DIAGNOSIS — Z7982 Long term (current) use of aspirin: Secondary | ICD-10-CM | POA: Diagnosis not present

## 2015-09-22 DIAGNOSIS — I951 Orthostatic hypotension: Secondary | ICD-10-CM

## 2015-09-22 DIAGNOSIS — R55 Syncope and collapse: Principal | ICD-10-CM | POA: Diagnosis present

## 2015-09-22 DIAGNOSIS — I071 Rheumatic tricuspid insufficiency: Secondary | ICD-10-CM

## 2015-09-22 DIAGNOSIS — I10 Essential (primary) hypertension: Secondary | ICD-10-CM | POA: Diagnosis not present

## 2015-09-22 DIAGNOSIS — I351 Nonrheumatic aortic (valve) insufficiency: Secondary | ICD-10-CM | POA: Diagnosis present

## 2015-09-22 HISTORY — DX: Rheumatoid arthritis, unspecified: M06.9

## 2015-09-22 HISTORY — DX: Rheumatic heart disease, unspecified: I09.9

## 2015-09-22 HISTORY — DX: Bradycardia, unspecified: R00.1

## 2015-09-22 HISTORY — DX: Rheumatic fever without heart involvement: I00

## 2015-09-22 LAB — BASIC METABOLIC PANEL
Anion gap: 7 (ref 5–15)
BUN: 25 mg/dL — AB (ref 6–20)
CHLORIDE: 107 mmol/L (ref 101–111)
CO2: 24 mmol/L (ref 22–32)
CREATININE: 0.84 mg/dL (ref 0.44–1.00)
Calcium: 8.9 mg/dL (ref 8.9–10.3)
GFR calc Af Amer: >60 mL/min (ref >60)
GFR calc non Af Amer: >60 mL/min (ref >60)
GLUCOSE: 93 mg/dL (ref 65–99)
POTASSIUM: 3.6 mmol/L (ref 3.5–5.1)
SODIUM: 138 mmol/L (ref 135–145)

## 2015-09-22 LAB — URINALYSIS, ROUTINE W REFLEX MICROSCOPIC
BILIRUBIN URINE: NEGATIVE
GLUCOSE, UA: NEGATIVE mg/dL
HGB URINE DIPSTICK: NEGATIVE
KETONES UR: NEGATIVE mg/dL
Leukocytes, UA: NEGATIVE
Nitrite: NEGATIVE
PH: 6.5 (ref 5.0–8.0)
PROTEIN: NEGATIVE mg/dL
Specific Gravity, Urine: 1.01 (ref 1.005–1.030)

## 2015-09-22 LAB — TROPONIN I
Troponin I: <0.03 ng/mL (ref <0.031)
Troponin I: <0.03 ng/mL (ref <0.031)

## 2015-09-22 LAB — CBC
HEMATOCRIT: 36.3 % (ref 36.0–46.0)
Hemoglobin: 11.9 g/dL — ABNORMAL LOW (ref 12.0–15.0)
MCH: 28.9 pg (ref 26.0–34.0)
MCHC: 32.8 g/dL (ref 30.0–36.0)
MCV: 88.1 fL (ref 78.0–100.0)
PLATELETS: 262 10*3/uL (ref 150–400)
RBC: 4.12 MIL/uL (ref 3.87–5.11)
RDW: 13 % (ref 11.5–15.5)
WBC: 4.9 10*3/uL (ref 4.0–10.5)

## 2015-09-22 LAB — CBG MONITORING, ED: Glucose-Capillary: 82 mg/dL (ref 65–99)

## 2015-09-22 MED ORDER — SODIUM CHLORIDE 0.9% FLUSH
3.0000 mL | Freq: Two times a day (BID) | INTRAVENOUS | Status: DC
  Administered 2015-09-22 – 2015-09-23 (×3): 3 mL via INTRAVENOUS

## 2015-09-22 MED ORDER — ASPIRIN EC 81 MG PO TBEC
81.0000 mg | DELAYED_RELEASE_TABLET | Freq: Every day | ORAL | Status: DC
  Administered 2015-09-23: 81 mg via ORAL
  Filled 2015-09-22: qty 1

## 2015-09-22 MED ORDER — ONDANSETRON HCL 4 MG/2ML IJ SOLN: 4.0000 mg | Freq: Four times a day (QID) | INTRAMUSCULAR | Status: DC | PRN

## 2015-09-22 MED ORDER — LIDOCAINE 5 % EX PTCH
1.0000 | MEDICATED_PATCH | CUTANEOUS | Status: DC
  Administered 2015-09-22 – 2015-09-23 (×2): 1 via TRANSDERMAL
  Filled 2015-09-22 (×2): qty 1

## 2015-09-22 MED ORDER — ACETAMINOPHEN 325 MG PO TABS
650.0000 mg | ORAL_TABLET | Freq: Four times a day (QID) | ORAL | Status: DC | PRN
Start: 2015-09-22 — End: 2015-09-23

## 2015-09-22 MED ORDER — FAMOTIDINE 20 MG PO TABS
20.0000 mg | ORAL_TABLET | Freq: Two times a day (BID) | ORAL | Status: DC
  Administered 2015-09-22 – 2015-09-23 (×2): 20 mg via ORAL
  Filled 2015-09-22 (×2): qty 1

## 2015-09-22 MED ORDER — ONDANSETRON HCL 4 MG PO TABS: 4.0000 mg | ORAL_TABLET | Freq: Four times a day (QID) | ORAL | Status: DC | PRN

## 2015-09-22 MED ORDER — ENOXAPARIN SODIUM 40 MG/0.4ML ~~LOC~~ SOLN
40.0000 mg | Freq: Every day | SUBCUTANEOUS | Status: DC
  Administered 2015-09-22: 40 mg via SUBCUTANEOUS
  Filled 2015-09-22 (×2): qty 0.4

## 2015-09-22 MED ORDER — OXYCODONE HCL 5 MG PO TABS: 5.0000 mg | ORAL_TABLET | ORAL | Status: DC | PRN

## 2015-09-22 MED ORDER — SODIUM CHLORIDE 0.9 % IV BOLUS (SEPSIS)
1000.0000 mL | Freq: Once | INTRAVENOUS | Status: AC
  Administered 2015-09-22: 1000 mL via INTRAVENOUS

## 2015-09-22 MED ORDER — MAGNESIUM OXIDE 400 (241.3 MG) MG PO TABS
200.0000 mg | ORAL_TABLET | Freq: Every day | ORAL | Status: DC
  Administered 2015-09-23: 200 mg via ORAL
  Filled 2015-09-22: qty 1

## 2015-09-22 MED ORDER — HYDROCHLOROTHIAZIDE 25 MG PO TABS
25.0000 mg | ORAL_TABLET | Freq: Every day | ORAL | Status: DC
  Filled 2015-09-22: qty 1

## 2015-09-22 MED ORDER — ACETAMINOPHEN 650 MG RE SUPP: 650.0000 mg | Freq: Four times a day (QID) | RECTAL | Status: DC | PRN

## 2015-09-22 MED ORDER — FELODIPINE ER 5 MG PO TB24
5.0000 mg | ORAL_TABLET | Freq: Every day | ORAL | Status: DC
  Administered 2015-09-23: 5 mg via ORAL
  Filled 2015-09-22: qty 1

## 2015-09-22 NOTE — ED Notes (Signed)
Pt has had dizziness with diaphoretic changes off/on x 1 month.  Last episode on Thursday.  Pt states she gets weak and has had falls at times.  Pt has cardiology appt coming up but not waiting until then.  No fever.  No pain.

## 2015-09-22 NOTE — ED Provider Notes (Signed)
CSN: [id number]     Arrival date & time 09/22/15  [id number] History   First MD Initiated Contact with Patient 09/22/15 0920     Chief Complaint  Patient presents with  . Dizziness  . Weakness     (Consider location/radiation/quality/duration/timing/severity/associated sxs/prior Treatment) HPI Complains of generalized weakness onset 2 days ago. Patient was at a picnic became diaphoretic and short of breath walking the bathroom and had near syncopal event. Since the event she feels generally weak. She denies any chest pain denies abdominal pain denies headache denies fever. No treatment prior to coming here. She had a similar episode a month ago. She feels that it might be her heart causing her to feel weak. She does not have an appointment with her cardiologist until July 19. No other associated symptoms. No treatment prior to coming here. Past Medical History  Diagnosis Date  . Arthritis   . Heart murmur     mitral insuff, aortic insuff  . Hypertension   . Rheumatoid heart disease   . MR (mitral regurgitation)   . AI (aortic insufficiency)   . History of nuclear stress test 11/2008    bruce myoview; normal pattern of perfusion in allregions, no significant wall motion abnormalities; no ECG changes, no significant ischemia demonstrated, low risk scan    Past Surgical History  Procedure Laterality Date  . Cholecystectomy    . Excisional hemorrhoidectomy    . Abdominal hysterectomy    . Colon surgery    . Hemi-microdiscectomy lumbar laminectomy level 4  1995  . Transthoracic echocardiogram  11/2011    EF=>55%; LA mild-mod dilated; mod-severe MR; mild-mod TR with normal systolic pressure; mild-mod AV regurg; mild pulm valve regurg    Family History  Problem Relation Age of Onset  . Bone cancer Maternal Uncle   . Hypertension Mother   . Stroke Mother   . Pneumonia Father   . Diabetes Sister     x2; one was step-sister  . Tuberculosis Sister     step-sister  . Diabetes Son   .  Diabetes Daughter    Social History  Substance Use Topics  . Smoking status: Never Smoker   . Smokeless tobacco: None  . Alcohol Use: No   OB History    No data available     Review of Systems  Constitutional: Positive for diaphoresis.  HENT: Negative.   Respiratory: Positive for shortness of breath.   Cardiovascular: Negative.   Gastrointestinal: Negative.   Musculoskeletal: Negative.   Skin: Negative.   Neurological: Positive for weakness.  Psychiatric/Behavioral: Negative.   All other systems reviewed and are negative.     Allergies  Tarka and Atorvastatin  Home Medications   Prior to Admission medications   Medication Sig Start Date End Date Taking? Authorizing Provider  aspirin 81 MG tablet Take 81 mg by mouth daily.    Historical Provider, MD  cyclobenzaprine (FLEXERIL) 5 MG tablet Take 5 mg by mouth daily as needed. 09/12/13   Historical Provider, MD  docusate sodium (COLACE) 100 MG capsule Take 100 mg by mouth daily.     Historical Provider, MD  felodipine (PLENDIL) 5 MG 24 hr tablet Take 5 mg by mouth daily.    Historical Provider, MD  hydrochlorothiazide (HYDRODIURIL) 25 MG tablet TAKE ONE TABLET BY MOUTH AT BEDTIME 04/30/15   Pixie Casino, MD  Lido-Capsaicin-Men-Methyl Sal (MEDI-PATCH-LIDOCAINE EX) Apply 1 patch topically daily as needed.    Historical Provider, MD  Magnesium 250 MG TABS Take 250  mg by mouth daily.    Historical Provider, MD  Naproxen-Esomeprazole (VIMOVO) 500-20 MG TBEC Take 1 tablet by mouth daily.    Historical Provider, MD  NITROSTAT 0.4 MG SL tablet Take 0.4 mg by mouth as needed. 02/26/15   Historical Provider, MD  pantoprazole (PROTONIX) 40 MG tablet Take 1 tablet (40 mg total) by mouth daily. 06/05/15   Pixie Casino, MD  predniSONE (DELTASONE) 5 MG tablet Take 5 mg by mouth as needed. 02/27/14   Historical Provider, MD   BP 147/60 mmHg  Pulse 55  Temp(Src) 98.1 F (36.7 C) (Oral)  Resp 18  SpO2 97% Physical Exam   Constitutional: She appears well-developed and well-nourished. No distress.  HENT:  Head: Normocephalic and atraumatic.  Eyes: Conjunctivae are normal. Pupils are equal, round, and reactive to light.  Neck: Neck supple. No tracheal deviation present. No thyromegaly present.  Cardiovascular: Regular rhythm.   No murmur heard. Bradycardic  Pulmonary/Chest: Effort normal and breath sounds normal.  Abdominal: Soft. Bowel sounds are normal. She exhibits no distension. There is no tenderness.  Musculoskeletal: Normal range of motion. She exhibits no edema or tenderness.  Neurological: She is alert. Coordination normal.  Skin: Skin is warm and dry. No rash noted.  Psychiatric: She has a normal mood and affect.  Nursing note and vitals reviewed.   ED Course  Procedures (including critical care time) Labs Review Labs Reviewed  BASIC METABOLIC PANEL  CBC  URINALYSIS, ROUTINE W REFLEX MICROSCOPIC (NOT AT [facility])  CBG MONITORING, ED    Imaging Review No results found. I have personally reviewed and evaluated these images and lab results as part of my medical decision-making.   EKG Interpretation None     ED ECG REPORT   Date: 09/22/2015  Rate: 50  Rhythm: sinus bradycardia  QRS Axis: normal  Intervals: normal  ST/T Wave abnormalities: normal  Conduction Disutrbances:none  Narrative Interpretation:   Old EKG Reviewed: unchanged  No change from 09/18/2014  I have personally reviewed the EKG tracing and agree with the computerized printout as noted.   12:05 PM patient resting comfortably. Results for orders placed or performed during the hospital encounter of [id number]  Basic metabolic panel  Result Value Ref Range   Sodium 138 135 - 145 mmol/L   Potassium 3.6 3.5 - 5.1 mmol/L   Chloride 107 101 - 111 mmol/L   CO2 24 22 - 32 mmol/L   Glucose, Bld 93 65 - 99 mg/dL   BUN 25 (H) 6 - 20 mg/dL   Creatinine, Ser 0.84 0.44 - 1.00 mg/dL   Calcium 8.9 8.9 - 10.3 mg/dL   GFR calc  non Af Amer >60 >60 mL/min   GFR calc Af Amer >60 >60 mL/min   Anion gap 7 5 - 15  CBC  Result Value Ref Range   WBC 4.9 4.0 - 10.5 K/uL   RBC 4.12 3.87 - 5.11 MIL/uL   Hemoglobin 11.9 (L) 12.0 - 15.0 g/dL   HCT 36.3 36.0 - 46.0 %   MCV 88.1 78.0 - 100.0 fL   MCH 28.9 26.0 - 34.0 pg   MCHC 32.8 30.0 - 36.0 g/dL   RDW 13.0 11.5 - 15.5 %   Platelets 262 150 - 400 K/uL  Urinalysis, Routine w reflex microscopic  Result Value Ref Range   Color, Urine YELLOW YELLOW   APPearance CLEAR CLEAR   Specific Gravity, Urine 1.010 1.005 - 1.030   pH 6.5 5.0 - 8.0   Glucose, UA  NEGATIVE NEGATIVE mg/dL   Hgb urine dipstick NEGATIVE NEGATIVE   Bilirubin Urine NEGATIVE NEGATIVE   Ketones, ur NEGATIVE NEGATIVE mg/dL   Protein, ur NEGATIVE NEGATIVE mg/dL   Nitrite NEGATIVE NEGATIVE   Leukocytes, UA NEGATIVE NEGATIVE  Troponin I  Result Value Ref Range   Troponin I <0.03 <0.031 ng/mL  CBG monitoring, ED  Result Value Ref Range   Glucose-Capillary 82 65 - 99 mg/dL   No results found.  MDM  I discussed case with Dr. Rayann Heman from cardiology who requests orthostatic vital signs, echocardiogram. 23 hour observation with hospitalist service. Cardiology can be consulted at hospitalists discretion. I consulted Dr. Marily Memos from hospitalist service. Plan 23 hour observation, telemetry Diagnosis near syncope Final diagnoses:  None        Orlie Dakin, MD 09/22/15 1214

## 2015-09-22 NOTE — Progress Notes (Signed)
EKG done as ordered. Unable to transmit electronically at this time d/t complications. Paper copy in patient's chart.

## 2015-09-22 NOTE — H&P (Signed)
History and Physical    Rhonda Spencer [id number] DOB: 01-30-1941 DOA: 09/22/2015  PCP: Maggie Font, MD Patient coming from: Home  Chief Complaint: Near syncope  HPI: Rhonda Spencer is a 75 y.o. female with medical history significant of rheumatic heart disease with resulting moderate aortic, tricuspid, and mitral insufficiencies, hysterectomy, GERD, Cystectomy, HTN presenting with syncopal episodes. Last episode was approximately 2 days ago. Patient was admitted to clinic with family members when the episode occurred. She was ambulating towards the bathroom she became diaphoretic and short of breath. Patient went down to the ground without any LOC, but was unable to get up for some time due to generalized weakness. Denies any palpitations, chest pain, nausea, vomiting, abdominal pain, headache, fevers. Patient's symptoms resolved on their own without further intervention. Similar episode occurred one month ago. Patient stated that symptoms may be related to her heart given her history of rheumatic heart disease. Next appointment with cardiology as July 19.    ED Course: The BMET, troponin, CBC, UA all unremarkable. Orthostatics taken by nurse reported as 123/60 with heart rate of 57 while lying and 119/68 with heart rate of 68 while standing. Dr. Rayann Heman consulted of Cardiology who recommends 23hr obs adn Echo EKG pending  Review of Systems: As per HPI otherwise 10 point review of systems negative.   Ambulatory Status: No restrictions  Past Medical History  Diagnosis Date  . Arthritis   . Heart murmur     mitral insuff, aortic insuff  . Hypertension   . Rheumatoid heart disease   . MR (mitral regurgitation)   . AI (aortic insufficiency)   . History of nuclear stress test 11/2008    bruce myoview; normal pattern of perfusion in allregions, no significant wall motion abnormalities; no ECG changes, no significant ischemia demonstrated, low risk scan     Past Surgical History    Procedure Laterality Date  . Cholecystectomy    . Excisional hemorrhoidectomy    . Abdominal hysterectomy    . Colon surgery    . Hemi-microdiscectomy lumbar laminectomy level 4  1995  . Transthoracic echocardiogram  11/2011    EF=>55%; LA mild-mod dilated; mod-severe MR; mild-mod TR with normal systolic pressure; mild-mod AV regurg; mild pulm valve regurg     Social History   Social History  . Marital Status: Single    Spouse Name: N/A  . Number of Children: 2  . Years of Education: 14   Occupational History  . Retired    Social History Main Topics  . Smoking status: Never Smoker   . Smokeless tobacco: Not on file  . Alcohol Use: No  . Drug Use: No  . Sexual Activity: Not on file   Other Topics Concern  . Not on file   Social History Narrative   She lives in an apartment in [place]. She has children, grandchildren and great grandchildren in the area.    Allergies  Allergen Reactions  . Tarka [Trandolapril-Verapamil Hcl Er] Swelling    Pt reports allergy to any cardiac medication ending in "-il"  . Atorvastatin Other (See Comments)    states it lowered her heart rate    Family History  Problem Relation Age of Onset  . Bone cancer Maternal Uncle   . Hypertension Mother   . Stroke Mother   . Pneumonia Father   . Diabetes Sister     x2; one was step-sister  . Tuberculosis Sister     step-sister  . Diabetes Son   .  Diabetes Daughter     Prior to Admission medications   Medication Sig Start Date End Date Taking? Authorizing Provider  aspirin 81 MG tablet Take 81 mg by mouth daily.   Yes Historical Provider, MD  famotidine (PEPCID) 20 MG tablet Take 20 mg by mouth 2 (two) times daily.   Yes Historical Provider, MD  felodipine (PLENDIL) 5 MG 24 hr tablet Take 5 mg by mouth daily.   Yes Historical Provider, MD  hydrochlorothiazide (HYDRODIURIL) 25 MG tablet TAKE ONE TABLET BY MOUTH AT BEDTIME 04/30/15  Yes Pixie Casino, MD  lidocaine (LIDODERM) 5 % Place 1  patch onto the skin daily. Remove & Discard patch within 12 hours or as directed by MD   Yes Historical Provider, MD  Magnesium 250 MG TABS Take 250 mg by mouth daily.   Yes Historical Provider, MD  NITROSTAT 0.4 MG SL tablet Take 0.4 mg by mouth every 5 (five) minutes as needed for chest pain.  02/26/15  Yes Historical Provider, MD  pantoprazole (PROTONIX) 40 MG tablet Take 1 tablet (40 mg total) by mouth daily. Patient not taking: Reported on 09/22/2015 06/05/15   Pixie Casino, MD    Physical Exam: Filed Vitals:   09/22/15 0857  BP: 147/60  Pulse: 55  Temp: 98.1 F (36.7 C)  TempSrc: Oral  Resp: 18  SpO2: 97%     General:  Appears calm and comfortable Eyes:  PERRL, EOMI, normal lids, iris ENT:  grossly normal hearing, lips & tongue, mmm Neck:  no LAD, masses or thyromegaly Cardiovascular:  RRR, III/VI systolic murmur and II/VI diastolic murmur. trace LE edema.  Respiratory:  CTA bilaterally, no w/r/r. Normal respiratory effort. Abdomen:  soft, ntnd, NABS Skin:  no rash or induration seen on limited exam Musculoskeletal:  grossly normal tone BUE/BLE, good ROM, no bony abnormality Psychiatric:  grossly normal mood and affect, speech fluent and appropriate, AOx3 Neurologic:  CN 2-12 grossly intact, moves all extremities in coordinated fashion, sensation intact  Labs on Admission: I have personally reviewed following labs and imaging studies  CBC:  Recent Labs Lab 09/22/15 0932  WBC 4.9  HGB 11.9*  HCT 36.3  MCV 88.1  PLT 99991111   Basic Metabolic Panel:  Recent Labs Lab 09/22/15 0932  NA 138  K 3.6  CL 107  CO2 24  GLUCOSE 93  BUN 25*  CREATININE 0.84  CALCIUM 8.9   GFR: CrCl cannot be calculated (Unknown ideal weight.). Liver Function Tests: No results for input(s): AST, ALT, ALKPHOS, BILITOT, PROT, ALBUMIN in the last 168 hours. No results for input(s): LIPASE, AMYLASE in the last 168 hours. No results for input(s): AMMONIA in the last 168  hours. Coagulation Profile: No results for input(s): INR, PROTIME in the last 168 hours. Cardiac Enzymes:  Recent Labs Lab 09/22/15 0932  TROPONINI <0.03   BNP (last 3 results) No results for input(s): PROBNP in the last 8760 hours. HbA1C: No results for input(s): HGBA1C in the last 72 hours. CBG:  Recent Labs Lab 09/22/15 0950  GLUCAP 82   Lipid Profile: No results for input(s): CHOL, HDL, LDLCALC, TRIG, CHOLHDL, LDLDIRECT in the last 72 hours. Thyroid Function Tests: No results for input(s): TSH, T4TOTAL, FREET4, T3FREE, THYROIDAB in the last 72 hours. Anemia Panel: No results for input(s): VITAMINB12, FOLATE, FERRITIN, TIBC, IRON, RETICCTPCT in the last 72 hours. Urine analysis:    Component Value Date/Time   COLORURINE YELLOW 09/22/2015 1123   APPEARANCEUR CLEAR 09/22/2015 1123   LABSPEC  1.010 09/22/2015 1123   PHURINE 6.5 09/22/2015 1123   GLUCOSEU NEGATIVE 09/22/2015 1123   HGBUR NEGATIVE 09/22/2015 [place] 09/22/2015 1123   KETONESUR NEGATIVE 09/22/2015 1123   PROTEINUR NEGATIVE 09/22/2015 1123   UROBILINOGEN 1.0 07/31/2013 0155   NITRITE NEGATIVE 09/22/2015 1123   LEUKOCYTESUR NEGATIVE 09/22/2015 1123    Creatinine Clearance: CrCl cannot be calculated (Unknown ideal weight.).  Sepsis Labs: @LABRCNTIP (procalcitonin:4,lacticidven:4) )No results found for this or any previous visit (from the past 240 hour(s)).   Radiological Exams on Admission: No results found.    Assessment/Plan Active Problems:   Rheumatic heart disease   Near syncope   Orthostasis   Essential hypertension   Chronic pain   Near-syncope: 2 separate episodes approximately 4 weeks apart. Last episode 2 days ago. Likely combination of dehydration, postprandial vasovagal, orthostasis. Patient with significant cardiac history including rheumatic fever w/ resultant AI, MI, and TI, so cannot rule out arrhythmia. EP discussed case with on-call EP physician, Dr. Rayann Heman  recommends 24-hour observation admission. Troponin negative, EKG per report by EDP without evidence of ACS or other acute abnormality. Orthostatic VS + as outlined above - Tele obs - cycle trop - repeat EKG - Echo - consult Cards if needed - IVF - cont ASA - recheck orthostatic VS in am  HTN: - continue felodipine, HCTZ  GERD: - continue pepcid  MSK pain: chronic and unchanged - cnotinue lidoderm patch  DVT prophylaxis: Lovenox  Code Status: FULL  Family Communication: daughter  Disposition Plan: Pending workup/ro  Consults called: none  Admission status: tele obs    Raena Pau J MD [facility]  If 7PM-7AM, please contact night-coverage [email or web address] Password [email or web address]  09/22/2015, 1:38 PM

## 2015-09-22 NOTE — ED Notes (Signed)
Made first request for urine,pt unable to provide one at this time.

## 2015-09-23 ENCOUNTER — Encounter (HOSPITAL_COMMUNITY): Payer: Self-pay | Admitting: Physician Assistant

## 2015-09-23 ENCOUNTER — Observation Stay (HOSPITAL_BASED_OUTPATIENT_CLINIC_OR_DEPARTMENT_OTHER): Payer: Medicare Other

## 2015-09-23 DIAGNOSIS — R011 Cardiac murmur, unspecified: Secondary | ICD-10-CM

## 2015-09-23 DIAGNOSIS — I071 Rheumatic tricuspid insufficiency: Secondary | ICD-10-CM

## 2015-09-23 DIAGNOSIS — I10 Essential (primary) hypertension: Secondary | ICD-10-CM | POA: Diagnosis not present

## 2015-09-23 DIAGNOSIS — I340 Nonrheumatic mitral (valve) insufficiency: Secondary | ICD-10-CM

## 2015-09-23 DIAGNOSIS — R55 Syncope and collapse: Secondary | ICD-10-CM | POA: Diagnosis not present

## 2015-09-23 DIAGNOSIS — G8929 Other chronic pain: Secondary | ICD-10-CM

## 2015-09-23 DIAGNOSIS — R001 Bradycardia, unspecified: Secondary | ICD-10-CM

## 2015-09-23 DIAGNOSIS — I351 Nonrheumatic aortic (valve) insufficiency: Secondary | ICD-10-CM

## 2015-09-23 DIAGNOSIS — M069 Rheumatoid arthritis, unspecified: Secondary | ICD-10-CM

## 2015-09-23 DIAGNOSIS — I951 Orthostatic hypotension: Secondary | ICD-10-CM | POA: Diagnosis not present

## 2015-09-23 DIAGNOSIS — I099 Rheumatic heart disease, unspecified: Secondary | ICD-10-CM

## 2015-09-23 LAB — COMPREHENSIVE METABOLIC PANEL
ALK PHOS: 64 U/L (ref 38–126)
ALT: 10 U/L — AB (ref 14–54)
AST: 16 U/L (ref 15–41)
Albumin: 3.4 g/dL — ABNORMAL LOW (ref 3.5–5.0)
Anion gap: 5 (ref 5–15)
BILIRUBIN TOTAL: 0.4 mg/dL (ref 0.3–1.2)
BUN: 22 mg/dL — ABNORMAL HIGH (ref 6–20)
CALCIUM: 8.8 mg/dL — AB (ref 8.9–10.3)
CO2: 26 mmol/L (ref 22–32)
CREATININE: 0.87 mg/dL (ref 0.44–1.00)
Chloride: 109 mmol/L (ref 101–111)
GFR calc non Af Amer: >60 mL/min (ref >60)
GLUCOSE: 105 mg/dL — AB (ref 65–99)
Potassium: 4.1 mmol/L (ref 3.5–5.1)
SODIUM: 140 mmol/L (ref 135–145)
TOTAL PROTEIN: 6.8 g/dL (ref 6.5–8.1)

## 2015-09-23 LAB — ECHOCARDIOGRAM COMPLETE
AVPHT: 563 ms
E decel time: 264 msec
E/e' ratio: 10.73
FS: 22 % — AB (ref 28–44)
HEIGHTINCHES: 65 in
IV/PV OW: 1.42
LA ID, A-P, ES: 30 mm
LA diam index: 1.68 cm/m2
LA vol index: 30.7 mL/m2
LA vol: 54.9 mL
LAVOLA4C: 43 mL
LDCA: 3.46 cm2
LEFT ATRIUM END SYS DIAM: 30 mm
LV E/e' medial: 10.73
LV E/e'average: 10.73
LV TDI E'LATERAL: 6.96
LV e' LATERAL: 6.96 cm/s
LVOTD: 21 mm
MV Dec: 264
MV Peak grad: 2 mmHg
MV pk A vel: 76.6 m/s
MV pk E vel: 74.7 m/s
PW: 11.4 mm — AB (ref 0.6–1.1)
RV TAPSE: 21.5 mm
TDI e' medial: 5.55
WEIGHTICAEL: 2430.35 [oz_av]

## 2015-09-23 LAB — CBC
HEMATOCRIT: 34.4 % — AB (ref 36.0–46.0)
HEMOGLOBIN: 11.3 g/dL — AB (ref 12.0–15.0)
MCH: 29.2 pg (ref 26.0–34.0)
MCHC: 32.8 g/dL (ref 30.0–36.0)
MCV: 88.9 fL (ref 78.0–100.0)
Platelets: 251 10*3/uL (ref 150–400)
RBC: 3.87 MIL/uL (ref 3.87–5.11)
RDW: 13.1 % (ref 11.5–15.5)
WBC: 3.9 10*3/uL — ABNORMAL LOW (ref 4.0–10.5)

## 2015-09-23 LAB — TSH: TSH: 0.929 u[IU]/mL (ref 0.350–4.500)

## 2015-09-23 MED ORDER — HYDROCHLOROTHIAZIDE 25 MG PO TABS
25.0000 mg | ORAL_TABLET | Freq: Every day | ORAL | Status: DC
  Administered 2015-09-23: 25 mg via ORAL

## 2015-09-23 NOTE — Progress Notes (Signed)
PROGRESS NOTE    Rhonda Spencer  [id number] DOB: [birth year] DOA: 09/22/2015 PCP: Maggie Font, MD  Outpatient Specialists: [facility], Cardiology     Brief Narrative:  75 yo F with rheumatic heart disease and moderate AR, TR, and MR who presents with pre-syncope.  [place] two days before admission, hadn't eaten much breakfast, felt weak, shaky, and nauseated, went to bathroom, got flushed and lightheaded and collapsed without LOC.  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   Assessment & Plan:  Active Problems:   Rheumatic heart disease   Near syncope   Orthostasis   Essential hypertension   Chronic pain   1. Near-syncope:  2 separate episodes approximately 4 weeks apart. Last episode 2 days ago. History consistent with vasovagal syncope.  However, history of structural heart disease and bradycardia.  ED discussed case with on-call EP physician, Dr. Rayann Heman recommends 24-hour observation admission. Troponin negative.  Orthostatics negative. Tele overnight with bradycardia down to 30s while sleeping, otherwise mostly in 40s and 50s, sinus, 1st deg AVB.   -Echo result pending -Consult to Cardiology -cont ASA   2. HTN: -continue felodipine, HCTZ  3. GERD: -continue pepcid  4. MSK pain:  Chronic and unchanged -continue lidoderm patch   DVT prophylaxis: Lovenox Code Status: FULL Family Communication: None present Disposition Plan: Follow up echocardiogram results and Cardiology consultation.  If echo no change, disposition pending Cards evaluation   Consultants:   Cardiology  Procedures:   Echocardiogram  Antimicrobials:   None    Subjective: Patient feeling well today.  Good appetite.  No palpitations, chest pain, dyspnea, further pre-syncopal episodes.  To me she describes several months of decreasing exercise tolerance, both walking in her neighborhood and also at [employer], without any episodes of exertional syncope or chest pain.  No leg  swelling or palpitations recently.    Objective: Filed Vitals:   09/22/15 1353 09/22/15 2217 09/23/15 0539 09/23/15 0724  BP: 143/63 136/54 131/68   Pulse: 58 51    Temp: 98.1 F (36.7 C) 98.4 F (36.9 C) 98.2 F (36.8 C)   TempSrc: Oral Oral Oral   Resp: 16 17 18    Height: 5\' 5"  (1.651 m)     Weight: 68.9 kg (151 lb 14.4 oz)     SpO2: 100% 100% 100% 100%    Intake/Output Summary (Last 24 hours) at 09/23/15 1011 Last data filed at 09/23/15 0253  Gross per 24 hour  Intake    240 ml  Output   1150 ml  Net   -910 ml   Filed Weights   09/22/15 1353  Weight: 68.9 kg (151 lb 14.4 oz)    Examination:  General exam: Appears calm and comfortable  Respiratory system: Clear to auscultation. Respiratory effort normal. Cardiovascular system: S1 & S2 heard, RRR. No JVD, murmurs, rubs, gallops or clicks. No pedal edema.  I appreicate no murmurs. Gastrointestinal system: Abdomen is nondistended, soft and nontender. No organomegaly or masses felt. Normal bowel sounds heard. Central nervous system: Alert and oriented. No focal neurological deficits.  Cranial nerves normal.  Extremities: Symmetric 5 x 5 power. Skin: No rashes, lesions or ulcers Psychiatry: Judgement and insight appear normal. Mood & affect appropriate.     Data Reviewed: I have personally reviewed following labs and imaging studies  CBC:  Recent Labs Lab 09/22/15 0932 09/23/15 0503  WBC 4.9 3.9*  HGB 11.9* 11.3*  HCT 36.3 34.4*  MCV 88.1 88.9  PLT 262 251  Basic Metabolic Panel:  Recent Labs Lab 09/22/15 0932 09/23/15 0503  NA 138 140  K 3.6 4.1  CL 107 109  CO2 24 26  GLUCOSE 93 105*  BUN 25* 22*  CREATININE 0.84 0.87  CALCIUM 8.9 8.8*   GFR: Estimated Creatinine Clearance: 53.7 mL/min (by C-G formula based on Cr of 0.87). Liver Function Tests:  Recent Labs Lab 09/23/15 0503  AST 16  ALT 10*  ALKPHOS 64  BILITOT 0.4  PROT 6.8  ALBUMIN 3.4*   Cardiac Enzymes:  Recent Labs Lab  09/22/15 0932 09/22/15 1558 09/22/15 2145  TROPONINI <0.03 <0.03 <0.03   CBG:  Recent Labs Lab 09/22/15 0950  GLUCAP 82   Urine analysis:    Component Value Date/Time   COLORURINE YELLOW 09/22/2015 1123   APPEARANCEUR CLEAR 09/22/2015 1123   LABSPEC 1.010 09/22/2015 1123   PHURINE 6.5 09/22/2015 1123   GLUCOSEU NEGATIVE 09/22/2015 1123   HGBUR NEGATIVE 09/22/2015 1123   BILIRUBINUR NEGATIVE 09/22/2015 1123   KETONESUR NEGATIVE 09/22/2015 1123   PROTEINUR NEGATIVE 09/22/2015 1123   UROBILINOGEN 1.0 07/31/2013 0155   NITRITE NEGATIVE 09/22/2015 1123   LEUKOCYTESUR NEGATIVE 09/22/2015 1123       Radiology Studies: No results found.      Scheduled Meds: . aspirin EC  81 mg Oral Daily  . enoxaparin (LOVENOX) injection  40 mg Subcutaneous QHS  . famotidine  20 mg Oral BID  . felodipine  5 mg Oral Daily  . hydrochlorothiazide  25 mg Oral Daily  . lidocaine  1 patch Transdermal Q24H  . magnesium oxide  200 mg Oral Daily  . sodium chloride flush  3 mL Intravenous Q12H   Continuous Infusions:          Edwin Dada, MD [facility] Pager [phone]  If 7PM-7AM, please contact night-coverage [email or web address] Password [email or web address] 09/23/2015, 10:11 AM

## 2015-09-23 NOTE — Discharge Instructions (Signed)
Likely your episodes are from being dehydrated. One of your blood pressure medicines, HCTZ, can make this worse, so (as discussed with Dr. Johnsie Cancel and his nurse practitioner): stop taking HCTZ for your blood pressure.   This may mean you need to start another medicine for blood pressure with Dr. Berdine Addison   Call Dr. Lysbeth Penner office tomorrow (Monday) for an "event monitor" to wear for a few weeks to see if you are having any episodes of severe low heart rate when you pass out.  They have gotten a message from Dr. Johnsie Cancel and should be expecting your call.  They will help you arrange to get the event monitor and then let you know when you should see them again (either Jul 19 or sooner).

## 2015-09-23 NOTE — Progress Notes (Signed)
  Echocardiogram 2D Echocardiogram has been performed.  Jennette Dubin 09/23/2015, 9:38 AM

## 2015-09-23 NOTE — Care Management Note (Signed)
Case Management Note  Patient Details  Name: Rhonda Spencer MRN: [id number] Date of Birth: 20-Jul-1940  Subjective/Objective:     dizziness, question contribution of bradycardia               Action/Plan: Discharge Planning: AVS reviewed: PCP- Iona Beard MD  NCM spoke to pt and states she has good support from family at home. Uses [employer] for transportation. Has RW and cane at home. Can afford medications at home.   Expected Discharge Date:  09/23/2015            Expected Discharge Plan:  Home/Self Care  In-House Referral:  NA  Discharge planning Services  CM Consult  Post Acute Care Choice:  NA Choice offered to:  NA  DME Arranged:  N/A DME Agency:  NA  HH Arranged:  NA HH Agency:  NA  Status of Service:  Completed, signed off  Medicare Important Message Given:    Date Medicare IM Given:    Medicare IM give by:    Date Additional Medicare IM Given:    Additional Medicare Important Message give by:     If discussed at [place] of Stay Meetings, dates discussed:    Additional Comments:  Erenest Rasher, RN 09/23/2015, 3:55 PM

## 2015-09-23 NOTE — Discharge Summary (Signed)
Physician Discharge Summary  Rhonda Spencer [id number] DOB: [birth year] DOA: 09/22/2015  PCP: Maggie Font, MD  Admit date: 09/22/2015 Discharge date: 09/23/2015  Admitted From: Home Disposition:  Home  Recommendations for Outpatient Follow-up:  1. Follow up with PCP in 1-2 weeks 2. Please obtain BMP/CBC in one week 3. If BP elevated off HCTZ, please increase felodipine  Home Health: No  Equipment/Devices: None   Discharge Condition: Stable CODE STATUS: FULL Diet recommendation: Heart Healthy  Brief/Interim Summary: 75 yo F with rheumatic heart disease and moderate AR, TR, and MR who presented with pre-syncope. [place] two [place], hadn't eaten much breakfast, felt weak, shaky, and nauseated, went to bathroom, got flushed and lightheaded and collapsed without LOC. Telemetry in hospital showed only old 1st deg AVB and bradycardia, unchanged.  Echocardiogram was repeated, showed no new changes.  Seen by Cardiology, who recommended stopping HCTZ, possibly increasing felodipine.       Discharge Diagnoses:  Active Problems:   AI (aortic insufficiency)   Mitral regurgitation   Rheumatic heart disease   Near syncope   Orthostasis   Essential hypertension   Chronic pain   Sinus bradycardia   Rheumatoid arthritis (HCC)   Tricuspid regurgitation    Discharge Instructions  Discharge Instructions    Diet - low sodium heart healthy    Complete by:  As directed      Discharge instructions    Complete by:  As directed   Likely your episodes are from being dehydrated.  One of your blood pressure medicines, HCTZ, can make this worse, so (as discussed with Dr. Johnsie Cancel and his nurse practitioner): stop taking HCTZ for your blood pressure.   This may mean you need to start another medicine for blood pressure with Dr. Debara Pickett or with your PCP Call Dr. Lysbeth Penner office this week for discussion of an "event monitor" to wear for a few weeks to see if you are having any  episodes of severe low heart rate when you pass out, and to talk about adjusting your felodipine for blood pressure.     Increase activity slowly    Complete by:  As directed             Medication List    STOP taking these medications        hydrochlorothiazide 25 MG tablet  Commonly known as:  HYDRODIURIL     NITROSTAT 0.4 MG SL tablet  Generic drug:  nitroGLYCERIN     pantoprazole 40 MG tablet  Commonly known as:  PROTONIX      TAKE these medications        aspirin 81 MG tablet  Take 81 mg by mouth daily.     famotidine 20 MG tablet  Commonly known as:  PEPCID  Take 20 mg by mouth 2 (two) times daily.     felodipine 5 MG 24 hr tablet  Commonly known as:  PLENDIL  Take 5 mg by mouth daily.     lidocaine 5 %  Commonly known as:  LIDODERM  Place 1 patch onto the skin daily. Remove & Discard patch within 12 hours or as directed by MD     Magnesium 250 MG Tabs  Take 250 mg by mouth daily.        Allergies  Allergen Reactions  . Tarka [Trandolapril-Verapamil Hcl Er] Swelling    Pt reports allergy to any cardiac medication ending in "-il"  . Atorvastatin Other (See Comments)    states  it lowered her heart rate    Consultations:  Cardiology, Dr. Johnsie Cancel   Procedures/Studies: Echocardiogram: Study Conclusions  - Left ventricle: The cavity size was normal. Wall thickness was  increased in a pattern of moderate LVH. Systolic function was  normal. The estimated ejection fraction was in the range of 55%  to 60%. Left ventricular diastolic function parameters were  normal. - Aortic valve: There was mild regurgitation. - Mitral valve: There was mild regurgitation. - Right atrium: The atrium was mildly dilated. - Atrial septum: No defect or patent foramen ovale was identified. - Tricuspid valve: There was moderate regurgitation.   Subjective: Comfortable on day of discharge, no further syncope, no chest pain or dizziness.   Discharge Exam: Filed  Vitals:   09/23/15 0539 09/23/15 1410  BP: 131/68 108/62  Pulse:  56  Temp: 98.2 F (36.8 C) 98.3 F (36.8 C)  Resp: 18 16   Filed Vitals:   09/22/15 2217 09/23/15 0539 09/23/15 0724 09/23/15 1410  BP: 136/54 131/68  108/62  Pulse: 51   56  Temp: 98.4 F (36.9 C) 98.2 F (36.8 C)  98.3 F (36.8 C)  TempSrc: Oral Oral  Oral  Resp: 17 18  16   Height:      Weight:      SpO2: 100% 100% 100% 100%    General exam: Appears calm and comfortable  Respiratory system: Clear to auscultation. Respiratory effort normal. Cardiovascular system: S1 & S2 heard, RRR. No JVD, murmurs, rubs, gallops or clicks. No pedal edema. I appreicate no murmurs. Gastrointestinal system: Abdomen is nondistended, soft and nontender. No organomegaly or masses felt. Normal bowel sounds heard. Central nervous system: Alert and oriented. No focal neurological deficits. Cranial nerves normal.  Extremities: Symmetric 5 x 5 power. Skin: No rashes, lesions or ulcers Psychiatry: Judgement and insight appear normal. Mood & affect appropriate.    The results of significant diagnostics from this hospitalization (including imaging, microbiology, ancillary and laboratory) are listed below for reference.     Microbiology: No results found for this or any previous visit (from the past 240 hour(s)).   Labs: BNP (last 3 results) No results for input(s): BNP in the last 8760 hours. Basic Metabolic Panel:  Recent Labs Lab 09/22/15 0932 09/23/15 0503  NA 138 140  K 3.6 4.1  CL 107 109  CO2 24 26  GLUCOSE 93 105*  BUN 25* 22*  CREATININE 0.84 0.87  CALCIUM 8.9 8.8*   Liver Function Tests:  Recent Labs Lab 09/23/15 0503  AST 16  ALT 10*  ALKPHOS 64  BILITOT 0.4  PROT 6.8  ALBUMIN 3.4*   No results for input(s): LIPASE, AMYLASE in the last 168 hours. No results for input(s): AMMONIA in the last 168 hours. CBC:  Recent Labs Lab 09/22/15 0932 09/23/15 0503  WBC 4.9 3.9*  HGB 11.9* 11.3*  HCT  36.3 34.4*  MCV 88.1 88.9  PLT 262 251   Cardiac Enzymes:  Recent Labs Lab 09/22/15 0932 09/22/15 1558 09/22/15 2145  TROPONINI <0.03 <0.03 <0.03   BNP: Invalid input(s): POCBNP CBG:  Recent Labs Lab 09/22/15 0950  GLUCAP 82   D-Dimer No results for input(s): DDIMER in the last 72 hours. Hgb A1c No results for input(s): HGBA1C in the last 72 hours. Lipid Profile No results for input(s): CHOL, HDL, LDLCALC, TRIG, CHOLHDL, LDLDIRECT in the last 72 hours. Thyroid function studies  Recent Labs  09/22/15 1600  TSH 0.929   Anemia work up No results for input(s):  VITAMINB12, FOLATE, FERRITIN, TIBC, IRON, RETICCTPCT in the last 72 hours. Urinalysis    Component Value Date/Time   COLORURINE YELLOW 09/22/2015 1123   APPEARANCEUR CLEAR 09/22/2015 1123   LABSPEC 1.010 09/22/2015 1123   PHURINE 6.5 09/22/2015 1123   GLUCOSEU NEGATIVE 09/22/2015 1123   HGBUR NEGATIVE 09/22/2015 [place] 09/22/2015 1123   KETONESUR NEGATIVE 09/22/2015 1123   PROTEINUR NEGATIVE 09/22/2015 1123   UROBILINOGEN 1.0 07/31/2013 0155   NITRITE NEGATIVE 09/22/2015 1123   LEUKOCYTESUR NEGATIVE 09/22/2015 1123   Sepsis Labs Invalid input(s): PROCALCITONIN,  WBC,  LACTICIDVEN Microbiology No results found for this or any previous visit (from the past 240 hour(s)).   Time coordinating discharge: Over 30 minutes  SIGNED:   Edwin Dada, MD  [facility] 09/23/2015, 3:12 PM   If 7PM-7AM, please contact night-coverage [email or web address] Password [email or web address]

## 2015-09-23 NOTE — Progress Notes (Signed)
Pt ambulated approx. 240 feet in hall as briskly as could tolerate- tolerated well. HR began at 71 and increased to 85 with activity. Denied weakness, dizziness, SOB or CP. Required minimal assist.

## 2015-09-23 NOTE — Consult Note (Signed)
Cardiology Consultation Note    Patient ID: Rhonda Spencer, MRN: [id number], DOB/AGE: October 12, 1940 75 y.o. Admit date: 09/22/2015   Date of Consult: 09/23/2015 Primary Physician: Maggie Font, MD Primary Cardiologist: Dr. Debara Pickett  Chief Complaint: dizziness on 09/21/15 Reason for Consultation: dizziness, question contribution of bradycardia Requesting MD: Dr. Loleta Books  HPI: [name] is a very pleasant 75 y/o female with history of rheumatic heart disease (mild AI/MR; mod TR), HTN, low-risk nuc 2010, acid reflux, arthritis, sinus bradycardia (HR 49 in 2016 EKG) whom we are asked to see for dizziness.   She presented yesterday for evaluation of an episode of weakness/dizziness that occurred on 09/20/15. She was in her US[employer] that morning - drank OJ & ate bagel/bacon for breakfast. She went to a picnic that afternoon outside. She was standing in line for about 5 minutes chatting at the buffet when she started to feel a sensation of weakness, dizziness, and shakiness. She started to walk towards the house to go sit down in the restroom. She only made it barely inside the house when she went down to the ground. No LOC or significant injury. A friend attended to her and helped take off her outer blouse in case it was heat stroke. She felt better within several minutes and spent the rest of her time indoors. She did not seek care at that time, but yesterday after thinking about it more, decided to get checked out in the ER because this was the second time this had had happened. The first was about a month ago while watching her 87-year-old great-granddaughter. She was standing in the kitchen fixing a meal when she suddenly felt dizzy, nervous, and weak. She went and sat down in a chair and felt better. She denies any preceding palpitations, chest pain, dyspnea, nausea, vomiting or abdominal pain. She takes a diuretic for her BP. In the ER, labwork notable for BUN of 25, Cr 0.84, troponin neg x 3, Hgb 11.3-11.9,  VSS. Orthostatics done yesterday showed BP 123/60->115/69->119/68 & HR 57->60->68; she was given IV fluid bolus and this AM they were negative.   Review of telemetry reveals sinus bradycardia mostly in the upper 40s-50s, with brief slowing to 39-40 around 7:30am this morning. She says she was awake and does not recall feeling any symptoms. Most recent echo was done this morning showing mod LVH, EF 55-60%, mild AI, mild MR, mild RAE, mod TR (previously reported as all 3 valves moderate).   Past Medical History  Diagnosis Date  . Rheumatoid arthritis ([place])   . Hypertension   . Rheumatic heart disease   . MR (mitral regurgitation)   . AI (aortic insufficiency)   . History of nuclear stress test 11/2008    bruce myoview; normal pattern of perfusion in allregions, no significant wall motion abnormalities; no ECG changes, no significant ischemia demonstrated, low risk scan   . Sinus bradycardia   . Rheumatic fever       Surgical History:  Past Surgical History  Procedure Laterality Date  . Cholecystectomy    . Excisional hemorrhoidectomy    . Abdominal hysterectomy    . Colon surgery    . Hemi-microdiscectomy lumbar laminectomy level 4  1995  . Transthoracic echocardiogram  11/2011    EF=>55%; LA mild-mod dilated; mod-severe MR; mild-mod TR with normal systolic pressure; mild-mod AV regurg; mild pulm valve regurg      Home Meds: Prior to Admission medications   Medication Sig Start Date End Date Taking? Authorizing  Provider  aspirin 81 MG tablet Take 81 mg by mouth daily.   Yes Historical Provider, MD  famotidine (PEPCID) 20 MG tablet Take 20 mg by mouth 2 (two) times daily.   Yes Historical Provider, MD  felodipine (PLENDIL) 5 MG 24 hr tablet Take 5 mg by mouth daily.   Yes Historical Provider, MD  hydrochlorothiazide (HYDRODIURIL) 25 MG tablet TAKE ONE TABLET BY MOUTH AT BEDTIME 04/30/15  Yes Pixie Casino, MD  lidocaine (LIDODERM) 5 % Place 1 patch onto the skin daily. Remove &  Discard patch within 12 hours or as directed by MD   Yes Historical Provider, MD  Magnesium 250 MG TABS Take 250 mg by mouth daily.   Yes Historical Provider, MD  NITROSTAT 0.4 MG SL tablet Take 0.4 mg by mouth every 5 (five) minutes as needed for chest pain.  02/26/15  Yes Historical Provider, MD  pantoprazole (PROTONIX) 40 MG tablet Take 1 tablet (40 mg total) by mouth daily. Patient not taking: Reported on 09/22/2015 06/05/15   Pixie Casino, MD    Inpatient Medications:  . aspirin EC  81 mg Oral Daily  . enoxaparin (LOVENOX) injection  40 mg Subcutaneous QHS  . famotidine  20 mg Oral BID  . felodipine  5 mg Oral Daily  . hydrochlorothiazide  25 mg Oral Daily  . lidocaine  1 patch Transdermal Q24H  . magnesium oxide  200 mg Oral Daily  . sodium chloride flush  3 mL Intravenous Q12H      Allergies:  Allergies  Allergen Reactions  . Tarka [Trandolapril-Verapamil Hcl Er] Swelling    Pt reports allergy to any cardiac medication ending in "-il"  . Atorvastatin Other (See Comments)    states it lowered her heart rate    Social History   Social History  . Marital Status: Single    Spouse Name: N/A  . Number of Children: 2  . Years of Education: 14   Occupational History  . Retired    Social History Main Topics  . Smoking status: Never Smoker   . Smokeless tobacco: Not on file  . Alcohol Use: No  . Drug Use: No  . Sexual Activity: Not on file   Other Topics Concern  . Not on file   Social History Narrative   She lives in an apartment in [place]. She has children, grandchildren and great grandchildren in the area.     Family History  Problem Relation Age of Onset  . Bone cancer Maternal Uncle   . Hypertension Mother   . Stroke Mother   . Pneumonia Father   . Diabetes Sister     x2; one was step-sister  . Tuberculosis Sister     step-sister  . Diabetes Son   . Diabetes Daughter   . Heart disease Mother      Review of Systems: + generalized fatigue All  other systems reviewed and are otherwise negative except as noted above.  Labs:  Recent Labs  09/22/15 0932 09/22/15 1558 09/22/15 2145  TROPONINI <0.03 <0.03 <0.03   Lab Results  Component Value Date   WBC 3.9* 09/23/2015   HGB 11.3* 09/23/2015   HCT 34.4* 09/23/2015   MCV 88.9 09/23/2015   PLT 251 09/23/2015    Recent Labs Lab 09/23/15 0503  NA 140  K 4.1  CL 109  CO2 26  BUN 22*  CREATININE 0.87  CALCIUM 8.8*  PROT 6.8  BILITOT 0.4  ALKPHOS 64  ALT 10*  AST 16  GLUCOSE 105*   Radiology/Studies:  No results found.  Wt Readings from Last 3 Encounters:  09/22/15 151 lb 14.4 oz (68.9 kg)  04/19/15 154 lb 14.4 oz (70.262 kg)  03/06/15 143 lb 14.4 oz (65.273 kg)    [id number] bradycardia 1st degree AVB, LVH, TWI in lead III, otherwise no sig changes  Physical Exam: Blood pressure 131/68, pulse 51, temperature 98.2 F (36.8 C), temperature source Oral, resp. rate 18, height 5\' 5"  (1.651 m), weight 151 lb 14.4 oz (68.9 kg), SpO2 100 %. Body mass index is 25.28 kg/(m^2). General: Well developed, well nourished thin AAF, in no acute distress. Head: Normocephalic, atraumatic, sclera non-icteric, no xanthomas, nares are without discharge.  Neck: Negative for carotid bruits. JVD not elevated. Lungs: Clear bilaterally to auscultation without wheezes, rales, or rhonchi. Breathing is unlabored. Heart: RRR with S1 S2. Very soft SEM. No rubs or gallops appreciated. Abdomen: Soft, non-tender, non-distended with normoactive bowel sounds. No hepatomegaly. No rebound/guarding. No obvious abdominal masses. Msk:  Strength and tone appear normal for age. Extremities: No clubbing or cyanosis. No edema.  Distal pedal pulses are 2+ and equal bilaterally. Neuro: Alert and oriented X 3. No facial asymmetry. No focal deficit. Moves all extremities spontaneously. Psych:  Responds to questions appropriately with a normal affect.     Assessment and Plan  48F with rheumatic heart  disease (mild AI/MR; mod TR), HTN, low-risk nuc 2010, acid reflux, arthritis, sinus bradycardia (HR 49 in 2016 EKG) whom we are asked to see for dizziness.   1. Recurrence of dizziness - symptoms and history would suggest more of an orthostatic-type picture, with supporting evidence of elevated BUN on admission and pulse increase upon standing with initial vitals. 2D echo is relatively unchanged from before. I favor discontinuation of HCTZ. Could further titrate felodipine if additional antihypertensive control is needed. (She received HCTZ this AM already so I will not make this increase today.) She does have evidence of sinus bradycardia on telemetry - it's possible that this coupled with dehydration could've contributed to her symptoms. She had an episode of HR 39-40 (nonsustained, all sinus) around 7:30 this AM without any corresponding symptoms. She does report a general sense of overall fatigue. Note HR by EKG in 2016 was 49. She states she was referred to Dr. Debara Pickett for low pulse rate so this was not a foreign concept to her. Will check TSH and review further recommendations with cardiologist. I briefly discussed possibility of event monitoring with patient. Will ask nurse to ambulate patient and document HR to assess chronotropic competence.  2. Sinus bradycardia - see above.   3. History of rheumatic fever with mild AI/MR and moderate TR by echo - no progression of valvular disease by echocardiogram.  Signed, Charlie Pitter PA-C 09/23/2015, 11:41 AM Pager: [phone]  Patient examined chart reviewed. Elderly black female clear lungs and SEM.  Dizzyness not related to rhythm. Telemetry with no AV block or long pauses. Just ambulated in halls and HR over 90.  Agree with stopping diuretic As BUN up and volume low Reviewed her echo from today and no change from previous with EF normal and only Mild AR/MR.  Ok to d/c home will arrange outpatient f/u with Dr Debara Pickett and event monitor.  Jenkins Rouge

## 2015-09-23 NOTE — Care Management Obs Status (Signed)
[facility] NOTIFICATION   Patient Details  Name: Rhonda Spencer MRN: [id number] Date of Birth: November 11, 1940   Medicare Observation Status Notification Given:  Yes    Erenest Rasher, RN 09/23/2015, 3:55 PM

## 2015-09-24 DIAGNOSIS — L2089 Other atopic dermatitis: Secondary | ICD-10-CM | POA: Diagnosis not present

## 2015-09-26 DIAGNOSIS — Z6825 Body mass index (BMI) 25.0-25.9, adult: Secondary | ICD-10-CM | POA: Diagnosis not present

## 2015-09-26 DIAGNOSIS — R001 Bradycardia, unspecified: Secondary | ICD-10-CM | POA: Diagnosis not present

## 2015-09-26 DIAGNOSIS — I10 Essential (primary) hypertension: Secondary | ICD-10-CM | POA: Diagnosis not present

## 2015-10-05 ENCOUNTER — Encounter: Payer: Self-pay | Admitting: Internal Medicine

## 2015-10-05 ENCOUNTER — Ambulatory Visit (INDEPENDENT_AMBULATORY_CARE_PROVIDER_SITE_OTHER): Payer: Medicare Other | Admitting: Internal Medicine

## 2015-10-05 VITALS — BP 130/60 | HR 60 | Ht 65.0 in | Wt 158.0 lb

## 2015-10-05 DIAGNOSIS — I340 Nonrheumatic mitral (valve) insufficiency: Secondary | ICD-10-CM

## 2015-10-05 DIAGNOSIS — I351 Nonrheumatic aortic (valve) insufficiency: Secondary | ICD-10-CM | POA: Diagnosis not present

## 2015-10-05 DIAGNOSIS — R55 Syncope and collapse: Secondary | ICD-10-CM

## 2015-10-05 DIAGNOSIS — I099 Rheumatic heart disease, unspecified: Secondary | ICD-10-CM

## 2015-10-05 NOTE — Patient Instructions (Signed)
Your physician wants you to follow-up in: 6 months with Dr. Debara Pickett. You will receive a reminder letter in the mail two months in advance. If you don't receive a letter, please call our office to schedule the follow-up appointment.   Your physician recommends that you continue on your current medications as directed. Please refer to the Current Medication list given to you today.

## 2015-10-05 NOTE — Progress Notes (Signed)
OFFICE NOTE  Chief Complaint:  Routine follow-up  Primary Care Physician: Maggie Font, MD  HPI:  Rhonda Spencer is a pleasant 75 year old with history of rheumatoid heart disease and mitral insufficiency as well as aortic insufficiency. Recently, she had an echocardiogram, in August 2013, which showed an EF of greater than 55% with stable LV internal diameter. However, she did have mild to moderate left atrial enlargement, moderate to severe MR and mild to moderate TR, as well as mild to moderate AI. This does seem to have worsened somewhat since her last echo a year prior. In the past 6 months, however, she has had no significant change in her symptomatology. She does get mildly short of breath with marked exertion. However, this is not significantly changed. She is still active without limitations. She was also notably more hypertensive at the time. Weight has been stable, if not perhaps a few pounds lighter. She reports no crescendo shortness of breath symptoms. She also denies any angina, especially with exertion.  I last saw her about 6 months ago prior to a colonoscopy. She underwent a repeat echocardiogram at that time which shows stable moderate mitral regurgitation with mild to moderate tricuspid regurgitation. There clearly are rheumatic features to her bowels. Her EF was actually almost 75%.  She continues to be asymptomatic with normal activities, but does get mildly short of breath with marked exercise for which she rest and the symptoms go away quickly. She has not had any cardiac chest pain.  This part returns today for follow-up. She reports feeling well. She occasionally has some problems with arthritis for which he takes prednisone. She's had some spasming is in her neck in her hands for which she takes magnesium. She does get short of breath with activity, but it is fairly stable. Her last echo was over a year and a half ago.  I saw Rhonda Spencer back today for follow-up. Overall  she is doing well. Her echo from 03/2014 shows stable valvular heart disease and EF of 60-65%. She denies any chest pain or worsening shortness of breath.  Rhonda Spencer returns today for follow-up. She was recently seen by [name] is an acute visit for chest pain. I also put my head in the office to see her as well. She was describing some chest discomfort which sounded atypical and may been reflux. [name] She did get a repeat echo which shows stable LV EF of 60-65% with grade 2 diastolic dysfunction and moderate AI as well as moderate TR which is unchanged.  10/05/2015  Rhonda Spencer returns today for follow-up. She was recently seen and admitted for presyncope. She had several episodes and was noted to be bradycardic but has had this in the past. Ultimately was thought that she might be orthostatic and her hydrochlorothiazide was discontinued. A repeat echo was performed which shows stable valvular heart disease. Since discharge she notes that she's had improvement in her symptoms and he no longer had any significant dizziness or presyncopal episodes. Heart rate today is 60. She is on no medications to slow heart rate.  PMHx:  Past Medical History  Diagnosis Date  . Rheumatoid arthritis ([place])   . Hypertension   . Rheumatic heart disease   . MR (mitral regurgitation)   . AI (aortic insufficiency)   . History of nuclear stress test 11/2008    bruce myoview; normal pattern of perfusion in allregions,  no significant wall motion abnormalities; no ECG changes, no significant ischemia demonstrated, low risk scan   . Sinus bradycardia   . Rheumatic fever     Past Surgical History  Procedure Laterality Date  . Cholecystectomy    . Excisional hemorrhoidectomy    . Abdominal hysterectomy    . Colon surgery    . Hemi-microdiscectomy lumbar laminectomy level 4  1995  . Transthoracic echocardiogram  11/2011    EF=>55%; LA  mild-mod dilated; mod-severe MR; mild-mod TR with normal systolic pressure; mild-mod AV regurg; mild pulm valve regurg     FAMHx:  Family History  Problem Relation Age of Onset  . Bone cancer Maternal Uncle   . Hypertension Mother   . Stroke Mother   . Pneumonia Father   . Diabetes Sister     x2; one was step-sister  . Tuberculosis Sister     step-sister  . Diabetes Son   . Diabetes Daughter   . Heart disease Mother     SOCHx:   reports that she has never smoked. She does not have any smokeless tobacco history on file. She reports that she does not drink alcohol or use illicit drugs.  ALLERGIES:  Allergies  Allergen Reactions  . Tarka [Trandolapril-Verapamil Hcl Er] Swelling    Pt reports allergy to any cardiac medication ending in "-il"  . Atorvastatin Other (See Comments)    states it lowered her heart rate    ROS: A comprehensive review of systems was negative.  HOME MEDS: Current Outpatient Prescriptions  Medication Sig Dispense Refill  . aspirin 81 MG tablet Take 81 mg by mouth daily.    . famotidine (PEPCID) 20 MG tablet Take 20 mg by mouth 2 (two) times daily.    . felodipine (PLENDIL) 5 MG 24 hr tablet Take 5 mg by mouth daily.    [name] lidocaine (LIDODERM) 5 % Place 1 patch onto the skin daily. Remove & Discard patch within 12 hours or as directed by MD    . Magnesium 250 MG TABS Take 250 mg by mouth daily.     No current facility-administered medications for this visit.    LABS/IMAGING: No results found for this or any previous visit (from the past 48 hour(s)). No results found.  VITALS: BP 130/60 mmHg  Pulse 60  Ht 5\' 5"  (1.651 m)  Wt 158 lb (71.668 kg)  BMI 26.29 kg/m2  EXAM: General appearance: alert and no distress Neck: no adenopathy, no carotid bruit, no JVD, supple, symmetrical, trachea midline and thyroid not enlarged, symmetric, no tenderness/mass/nodules Lungs: clear to auscultation bilaterally Heart: regular rate and rhythm, systolic  murmur: late systolic 3/6, blowing at apex and diastolic murmur: holodiastolic 3/6, low pitch at 2nd left intercostal space Abdomen: soft, non-tender; bowel sounds normal; no masses,  no organomegaly Extremities: extremities normal, atraumatic, no cyanosis or edema Pulses: 2+ and symmetric Skin: Skin color, texture, turgor normal. No rashes or lesions Neurologic: Grossly normal  EKG: Deferred  ASSESSMENT: 1. Recent presyncope due to orthostatic hypotension 2. Rheumatic heart disease with mild to moderate aortic insufficiency and moderate to severe mitral regurgitation - stable, LVEF 60-65% 3. Hypertension 4. LVH by voltage criteria 5. GERD  PLAN: 1.   Rhonda Spencer is doing very well from a cardiovascular standpoint. A repeat echo is stable with no worsening valvular heart disease. Her symptoms have improved after discontinuing hydrochlorothiazide. Heart rate is also improved in the 60s. As she is now asymptomatic, I do not feel any further workup  is necessary at this time. If she should have further episodes we may consider event monitoring.  Follow-up with me in 6 months.  Pixie Casino, MD, [facility] Attending Cardiologist [place] C Tillie Viverette 10/05/2015, 2:13 PM

## 2015-10-22 ENCOUNTER — Ambulatory Visit: Payer: Medicare Other | Admitting: Internal Medicine

## 2015-10-29 DIAGNOSIS — R001 Bradycardia, unspecified: Secondary | ICD-10-CM | POA: Diagnosis not present

## 2015-10-29 DIAGNOSIS — I10 Essential (primary) hypertension: Secondary | ICD-10-CM | POA: Diagnosis not present

## 2015-10-29 DIAGNOSIS — Z6826 Body mass index (BMI) 26.0-26.9, adult: Secondary | ICD-10-CM | POA: Diagnosis not present

## 2015-12-12 DIAGNOSIS — Z1231 Encounter for screening mammogram for malignant neoplasm of breast: Secondary | ICD-10-CM | POA: Diagnosis not present

## 2016-01-07 DIAGNOSIS — I10 Essential (primary) hypertension: Secondary | ICD-10-CM | POA: Diagnosis not present

## 2016-01-09 DIAGNOSIS — I10 Essential (primary) hypertension: Secondary | ICD-10-CM | POA: Diagnosis not present

## 2016-01-14 DIAGNOSIS — Z23 Encounter for immunization: Secondary | ICD-10-CM | POA: Diagnosis not present

## 2016-01-17 DIAGNOSIS — Z0000 Encounter for general adult medical examination without abnormal findings: Secondary | ICD-10-CM | POA: Diagnosis not present

## 2016-03-19 DIAGNOSIS — M25531 Pain in right wrist: Secondary | ICD-10-CM | POA: Diagnosis not present

## 2016-03-20 ENCOUNTER — Other Ambulatory Visit: Payer: Self-pay | Admitting: Family Medicine

## 2016-03-20 ENCOUNTER — Ambulatory Visit
Admission: RE | Admit: 2016-03-20 | Discharge: 2016-03-20 | Disposition: A | Discharge status: Home / Self Care | Payer: Medicare Other | Source: Ambulatory Visit | Attending: Family Medicine | Admitting: Family Medicine

## 2016-03-20 DIAGNOSIS — R52 Pain, unspecified: Secondary | ICD-10-CM

## 2016-03-20 DIAGNOSIS — M25531 Pain in right wrist: Secondary | ICD-10-CM | POA: Diagnosis not present

## 2016-04-14 DIAGNOSIS — M25531 Pain in right wrist: Secondary | ICD-10-CM | POA: Diagnosis not present

## 2016-06-06 ENCOUNTER — Ambulatory Visit (INDEPENDENT_AMBULATORY_CARE_PROVIDER_SITE_OTHER): Payer: Medicare Other | Admitting: Physician Assistant

## 2016-06-06 ENCOUNTER — Encounter: Payer: Self-pay | Admitting: Physician Assistant

## 2016-06-06 VITALS — BP 124/62 | HR 58 | Ht 65.0 in | Wt 153.6 lb

## 2016-06-06 DIAGNOSIS — I099 Rheumatic heart disease, unspecified: Secondary | ICD-10-CM | POA: Diagnosis not present

## 2016-06-06 DIAGNOSIS — I340 Nonrheumatic mitral (valve) insufficiency: Secondary | ICD-10-CM | POA: Diagnosis not present

## 2016-06-06 DIAGNOSIS — I351 Nonrheumatic aortic (valve) insufficiency: Secondary | ICD-10-CM

## 2016-06-06 DIAGNOSIS — I071 Rheumatic tricuspid insufficiency: Secondary | ICD-10-CM | POA: Diagnosis not present

## 2016-06-06 NOTE — Patient Instructions (Addendum)
Medication Instructions:  Your physician recommends that you continue on your current medications as directed. Please refer to the Current Medication list given to you today.  Labwork: NONE  Testing/Procedures: Your physician has requested that you have an echocardiogram in 6 MONTHS. Echocardiography is a painless test that uses sound waves to create images of your heart. It provides your doctor with information about the size and shape of your heart and how well your heart's chambers and valves are working. This procedure takes approximately one hour. There are no restrictions for this procedure.   Follow-Up: Your physician wants you to follow-up in: 6 months with Dr. Debara Pickett (after echo). You will receive a reminder letter in the mail two months in advance. If you don't receive a letter, please call our office to schedule the follow-up appointment.   Any Other Special Instructions Will Be Listed Below (If Applicable).     If you need a refill on your cardiac medications before your next appointment, please call your pharmacy.

## 2016-06-06 NOTE — Progress Notes (Signed)
Cardiology Office Note    Date:  06/06/2016   ID:  Rhonda Spencer, DOB 07/29/1940, MRN [id number]  PCP:  Rhonda Font, MD  Cardiologist:  Dr. Debara Pickett  Chief Complaint  Patient presents with  . Follow-up    seen for Dr. Debara Pickett, aortic and mitral valve insufficiency    History of Present Illness:  Rhonda Spencer is a 76 y.o. female with PMH of rheumatic heart disease, hypertension, history of sinus bradycardia, and mitral insufficiency as well as aortic insufficiency. Since August 2013, her echocardiogram has demonstrated moderate to severe MR as well as mild to moderate AI. Fortunately, it does not appear patient has significant shortness of breath or chest discomfort. Since then, she has had several repeat echocardiogram, some of them estimated MR to be mild while others moderate. Last office visit was June 2017. Prior to that, she did have episodic dizziness and presyncope, she has chronic bradycardia, eventually was felt patient had orthostatic hypotension and her hydrochlorothiazide was discontinued. She did have a repeat echocardiogram on 09/23/2015 which showed moderate LVH, EF 55-60%, mild AI/MR, moderate TR. Her valve issue seems to be quite stable at this point.  She presents today for 6 month follow-up. She has been doing well, it is quite interesting that the degree of her aortic regurgitation and also mitral regurgitation appears to be fluctuating on recent years echocardiogram. On physical exam, she only has 2/6 diastolic heart murmur which is quieter than what I expected if it is severe aortic regurg. Since her HCTZ has been taken off last year, she has not had any further dizziness. She denies any chest pain or shortness of breath out of usual. If she exerts herself too much, she will have some mild degree of shortness of breath which is chronic for her.   Past Medical History:  Diagnosis Date  . AI (aortic insufficiency)   . History of nuclear stress test 11/2008   bruce myoview;  normal pattern of perfusion in allregions, no significant wall motion abnormalities; no ECG changes, no significant ischemia demonstrated, low risk scan   . Hypertension   . MR (mitral regurgitation)   . Rheumatic fever   . Rheumatic heart disease   . Rheumatoid arthritis ([place])   . Sinus bradycardia     Past Surgical History:  Procedure Laterality Date  . ABDOMINAL HYSTERECTOMY    . CHOLECYSTECTOMY    . COLON SURGERY    . EXCISIONAL HEMORRHOIDECTOMY    . HEMI-MICRODISCECTOMY LUMBAR LAMINECTOMY LEVEL 4  1995  . TRANSTHORACIC ECHOCARDIOGRAM  11/2011   EF=>55%; LA mild-mod dilated; mod-severe MR; mild-mod TR with normal systolic pressure; mild-mod AV regurg; mild pulm valve regurg     Current Medications: Outpatient Medications Prior to Visit  Medication Sig Dispense Refill  . aspirin 81 MG tablet Take 81 mg by mouth daily.    . famotidine (PEPCID) 20 MG tablet Take 20 mg by mouth 2 (two) times daily.    . felodipine (PLENDIL) 5 MG 24 hr tablet Take 5 mg by mouth daily.    [name] lidocaine (LIDODERM) 5 % Place 1 patch onto the skin daily. Remove & Discard patch within 12 hours or as directed by MD    . Magnesium 250 MG TABS Take 250 mg by mouth daily.     No facility-administered medications prior to visit.      Allergies:   Tarka [trandolapril-verapamil hcl er] and Atorvastatin   Social History   Social History  . Marital status:  Single    Spouse name: N/A  . Number of children: 2  . Years of education: 14   Occupational History  . Retired    Social History Main Topics  . Smoking status: Never Smoker  . Smokeless tobacco: Never Used  . Alcohol use No  . Drug use: No  . Sexual activity: Not Asked   Other Topics Concern  . None   Social History Narrative   She lives in an apartment in [place]. She has children, grandchildren and great grandchildren in the area.     Family History:  The patient's family history includes Bone cancer in her maternal uncle; Diabetes in  her daughter, sister, and son; Heart disease in her mother; Hypertension in her mother; Pneumonia in her father; Stroke in her mother; Tuberculosis in her sister.   ROS:   Please see the history of present illness.    ROS All other systems reviewed and are negative.   PHYSICAL EXAM:   VS:  BP 124/62   Pulse (!) 58   Ht 5\' 5"  (1.651 m)   Wt 153 lb 9.6 oz (69.7 kg)   BMI 25.56 kg/m    GEN: Well nourished, well developed, in no acute distress  HEENT: normal  Neck: no JVD, carotid bruits, or masses Cardiac: RRR; no rubs, or gallops,no edema  [id number] systolic murmur Respiratory:  clear to auscultation bilaterally, normal work of breathing GI: soft, nontender, nondistended, + BS MS: no deformity or atrophy  Skin: warm and dry, no rash Neuro:  Alert and Oriented x 3, Strength and sensation are intact Psych: euthymic mood, full affect  Wt Readings from Last 3 Encounters:  06/06/16 153 lb 9.6 oz (69.7 kg)  10/05/15 158 lb (71.7 kg)  09/22/15 151 lb 14.4 oz (68.9 kg)      Studies/Labs Reviewed:   EKG:  EKG is not ordered today.    Recent Labs: 09/22/2015: TSH 0.929 09/23/2015: ALT 10; BUN 22; Creatinine, Ser 0.87; Hemoglobin 11.3; Platelets 251; Potassium 4.1; Sodium 140   Lipid Panel No results found for: CHOL, TRIG, HDL, CHOLHDL, VLDL, LDLCALC, LDLDIRECT  Additional studies/ records that were reviewed today include:   Echo 09/23/2015 LV EF: 55% -   60%   - Left ventricle: The cavity size was normal. Wall thickness was   increased in a pattern of moderate LVH. Systolic function was   normal. The estimated ejection fraction was in the range of 55%   to 60%. Left ventricular diastolic function parameters were   normal. - Aortic valve: There was mild regurgitation. - Mitral valve: There was mild regurgitation. - Right atrium: The atrium was mildly dilated. - Atrial septum: No defect or patent foramen ovale was identified. - Tricuspid valve: There was moderate  regurgitation.   ASSESSMENT:    1. Aortic valve insufficiency, etiology of cardiac valve disease unspecified   2. Mitral valve insufficiency, unspecified etiology   3. Rheumatic heart disease   4. Tricuspid valve insufficiency, unspecified etiology      PLAN:  In order of problems listed above:  1. Aortic insufficiency: Appears to have been worsened on echocardiogram in 2013, however interestingly, despite the fact she had moderate to severe AI on echocardiogram in 2013, subsequent echocardiogram shows her aortic regurgitation is actually moderate. Her latest echocardiogram in June 2017 showed her AI is mild. We'll continue to observe with yearly echocardiograms. Related to rheumatic heart disease when she was a child. She does not appear to have any obvious symptom today,  she has chronic dyspnea on exertion which is only associated with significant amount of activity. Otherwise she is stable without any dizziness or chest discomfort. She can follow-up in 6 months.   2. Mitral valve insufficiency: mild on recent echocardiogram in 2017.    Medication Adjustments/Labs and Tests Ordered: Current medicines are reviewed at length with the patient today.  Concerns regarding medicines are outlined above.  Medication changes, Labs and Tests ordered today are listed in the Patient Instructions below. Patient Instructions  Medication Instructions:  Your physician recommends that you continue on your current medications as directed. Please refer to the Current Medication list given to you today.  Labwork: NONE  Testing/Procedures: Your physician has requested that you have an echocardiogram in 6 MONTHS. Echocardiography is a painless test that uses sound waves to create images of your heart. It provides your doctor with information about the size and shape of your heart and how well your heart's chambers and valves are working. This procedure takes approximately one hour. There are no  restrictions for this procedure.   Follow-Up: Your physician wants you to follow-up in: 6 months with Dr. Debara Pickett (after echo). You will receive a reminder letter in the mail two months in advance. If you don't receive a letter, please call our office to schedule the follow-up appointment.   Any Other Special Instructions Will Be Listed Below (If Applicable).     If you need a refill on your cardiac medications before your next appointment, please call your pharmacy.      Hilbert Corrigan, [place]  06/06/2016 [facility] [address], [place], [place]  [address] Phone: [phone]; Fax: [phone]

## 2016-08-11 DIAGNOSIS — H6120 Impacted cerumen, unspecified ear: Secondary | ICD-10-CM | POA: Diagnosis not present

## 2016-09-23 DIAGNOSIS — R634 Abnormal weight loss: Secondary | ICD-10-CM | POA: Diagnosis not present

## 2016-09-23 DIAGNOSIS — R194 Change in bowel habit: Secondary | ICD-10-CM | POA: Diagnosis not present

## 2016-09-23 DIAGNOSIS — Z86010 Personal history of colonic polyps: Secondary | ICD-10-CM | POA: Diagnosis not present

## 2016-09-24 ENCOUNTER — Ambulatory Visit
Admission: RE | Admit: 2016-09-24 | Discharge: 2016-09-24 | Disposition: A | Discharge status: Home / Self Care | Payer: Medicare Other | Source: Ambulatory Visit | Attending: Gastroenterology | Admitting: Gastroenterology

## 2016-09-24 ENCOUNTER — Other Ambulatory Visit: Payer: Self-pay | Admitting: Gastroenterology

## 2016-09-24 DIAGNOSIS — R945 Abnormal results of liver function studies: Secondary | ICD-10-CM

## 2016-09-24 DIAGNOSIS — R7989 Other specified abnormal findings of blood chemistry: Secondary | ICD-10-CM

## 2016-09-24 DIAGNOSIS — N281 Cyst of kidney, acquired: Secondary | ICD-10-CM | POA: Diagnosis not present

## 2016-09-24 DIAGNOSIS — R634 Abnormal weight loss: Secondary | ICD-10-CM

## 2016-09-24 MED ORDER — IOPAMIDOL (ISOVUE-300) INJECTION 61%
10.0000 mL | Freq: Once | INTRAVENOUS | Status: AC | PRN
  Administered 2016-09-24: 10 mL via ORAL

## 2016-09-24 MED ORDER — IOPAMIDOL (ISOVUE-300) INJECTION 61%
100.0000 mL | Freq: Once | INTRAVENOUS | Status: AC | PRN
  Administered 2016-09-24: 100 mL via INTRAVENOUS

## 2016-09-25 ENCOUNTER — Other Ambulatory Visit: Payer: Self-pay | Admitting: Gastroenterology

## 2016-09-26 ENCOUNTER — Encounter (HOSPITAL_COMMUNITY): Payer: Self-pay

## 2016-09-26 ENCOUNTER — Ambulatory Visit (HOSPITAL_COMMUNITY)
Admission: RE | Admit: 2016-09-26 | Discharge: 2016-09-26 | Disposition: A | Discharge status: Home / Self Care | Payer: Medicare Other | Source: Ambulatory Visit | Attending: Gastroenterology | Admitting: Gastroenterology

## 2016-09-26 ENCOUNTER — Encounter (HOSPITAL_COMMUNITY)
Admission: RE | Disposition: A | Discharge status: Home / Self Care | Payer: Self-pay | Source: Ambulatory Visit | Attending: Gastroenterology

## 2016-09-26 ENCOUNTER — Other Ambulatory Visit: Payer: Self-pay | Admitting: Gastroenterology

## 2016-09-26 DIAGNOSIS — K7689 Other specified diseases of liver: Secondary | ICD-10-CM | POA: Insufficient documentation

## 2016-09-26 DIAGNOSIS — Z9049 Acquired absence of other specified parts of digestive tract: Secondary | ICD-10-CM | POA: Insufficient documentation

## 2016-09-26 DIAGNOSIS — M069 Rheumatoid arthritis, unspecified: Secondary | ICD-10-CM | POA: Insufficient documentation

## 2016-09-26 DIAGNOSIS — K8689 Other specified diseases of pancreas: Secondary | ICD-10-CM | POA: Diagnosis not present

## 2016-09-26 DIAGNOSIS — K831 Obstruction of bile duct: Secondary | ICD-10-CM | POA: Insufficient documentation

## 2016-09-26 DIAGNOSIS — I10 Essential (primary) hypertension: Secondary | ICD-10-CM | POA: Insufficient documentation

## 2016-09-26 DIAGNOSIS — R932 Abnormal findings on diagnostic imaging of liver and biliary tract: Secondary | ICD-10-CM | POA: Diagnosis not present

## 2016-09-26 DIAGNOSIS — C259 Malignant neoplasm of pancreas, unspecified: Secondary | ICD-10-CM | POA: Diagnosis not present

## 2016-09-26 HISTORY — PX: UPPER ESOPHAGEAL ENDOSCOPIC ULTRASOUND (EUS): SHX6562

## 2016-09-26 SURGERY — UPPER ESOPHAGEAL ENDOSCOPIC ULTRASOUND (EUS)
Anesthesia: Moderate Sedation

## 2016-09-26 MED ORDER — LACTATED RINGERS IV SOLN
INTRAVENOUS | Status: DC
Start: 2016-09-26 — End: 2016-09-26
  Administered 2016-09-26: 1000 mL via INTRAVENOUS

## 2016-09-26 MED ORDER — FENTANYL CITRATE (PF) 100 MCG/2ML IJ SOLN
INTRAMUSCULAR | Status: DC | PRN
  Administered 2016-09-26: 12.5 ug via INTRAVENOUS
  Administered 2016-09-26 (×2): 25 ug via INTRAVENOUS
  Administered 2016-09-26: 12.5 ug via INTRAVENOUS

## 2016-09-26 MED ORDER — MIDAZOLAM HCL 10 MG/2ML IJ SOLN
INTRAMUSCULAR | Status: DC | PRN
  Administered 2016-09-26: 1 mg via INTRAVENOUS
  Administered 2016-09-26: 2 mg via INTRAVENOUS
  Administered 2016-09-26 (×3): 1 mg via INTRAVENOUS
  Administered 2016-09-26: 2 mg via INTRAVENOUS

## 2016-09-26 MED ORDER — SODIUM CHLORIDE 0.9 % IV SOLN: INTRAVENOUS | Status: DC

## 2016-09-26 MED ORDER — DIPHENHYDRAMINE HCL 50 MG/ML IJ SOLN
INTRAMUSCULAR | Status: AC
  Filled 2016-09-26: qty 1

## 2016-09-26 MED ORDER — MIDAZOLAM HCL 5 MG/ML IJ SOLN
INTRAMUSCULAR | Status: AC
  Filled 2016-09-26: qty 2

## 2016-09-26 MED ORDER — FENTANYL CITRATE (PF) 100 MCG/2ML IJ SOLN
INTRAMUSCULAR | Status: AC
  Filled 2016-09-26: qty 2

## 2016-09-26 MED ORDER — BUTAMBEN-TETRACAINE-BENZOCAINE 2-2-14 % EX AERO
INHALATION_SPRAY | CUTANEOUS | Status: DC | PRN
  Administered 2016-09-26: 1 via TOPICAL

## 2016-09-26 MED ORDER — DIPHENHYDRAMINE HCL 50 MG/ML IJ SOLN
INTRAMUSCULAR | Status: DC | PRN
  Administered 2016-09-26: 12.5 mg via INTRAVENOUS

## 2016-09-26 NOTE — Discharge Instructions (Signed)
YOU HAD AN ENDOSCOPIC PROCEDURE TODAY: Refer to the procedure report and other information in the discharge instructions given to you for any specific questions about what was found during the examination. If this information does not answer your questions, please call [facility] at [phone] to clarify.   YOU SHOULD EXPECT: Some feelings of bloating in the abdomen. Passage of more gas than usual. Walking can help get rid of the air that was put into your GI tract during the procedure and reduce the bloating. If you had a lower endoscopy (such as a colonoscopy or flexible sigmoidoscopy) you may notice spotting of blood in your stool or on the toilet paper. Some abdominal soreness may be present for a day or two, also.  DIET: Your first meal following the procedure should be a light meal and then it is ok to progress to your normal diet. A half-sandwich or bowl of soup is an example of a good first meal. Heavy or fried foods are harder to digest and may make you feel nauseous or bloated. Drink plenty of fluids but you should avoid alcoholic beverages for 24 hours. If you had an esophageal dilation, please see attached information for diet.   ACTIVITY: Your care partner should take you home directly after the procedure. You should plan to take it easy, moving slowly for the rest of the day. You can resume normal activity the day after the procedure however YOU SHOULD NOT DRIVE, use power tools, machinery or perform tasks that involve climbing or major physical exertion for 24 hours (because of the sedation medicines used during the test).   SYMPTOMS TO REPORT IMMEDIATELY: A gastroenterologist can be reached at any hour. Please call [phone]  for any of the following symptoms:  Following lower endoscopy (colonoscopy, flexible sigmoidoscopy) Excessive amounts of blood in the stool  Significant tenderness, worsening of abdominal pains  Swelling of the abdomen that is new, acute  Fever of  100 or higher  Following upper endoscopy (EGD, EUS, ERCP, esophageal dilation) Vomiting of blood or coffee ground material  New, significant abdominal pain  New, significant chest pain or pain under the shoulder blades  Painful or persistently difficult swallowing  New shortness of breath  Black, tarry-looking or red, bloody stools  FOLLOW UP:  If any biopsies were taken you will be contacted by phone or by letter within the next 1-3 weeks. Call [phone]  if you have not heard about the biopsies in 3 weeks.  Please also call with any specific questions about appointments or follow up tests. YOU HAD AN ENDOSCOPIC PROCEDURE TODAY: Refer to the procedure report and other information in the discharge instructions given to you for any specific questions about what was found during the examination. If this information does not answer your questions, please call [facility] at [phone] to clarify.   YOU SHOULD EXPECT: Some feelings of bloating in the abdomen. Passage of more gas than usual. Walking can help get rid of the air that was put into your GI tract during the procedure and reduce the bloating. If you had a lower endoscopy (such as a colonoscopy or flexible sigmoidoscopy) you may notice spotting of blood in your stool or on the toilet paper. Some abdominal soreness may be present for a day or two, also.  DIET: Your first meal following the procedure should be a light meal and then it is ok to progress to your normal diet. A half-sandwich or bowl of soup is an example   of a good first meal. Heavy or fried foods are harder to digest and may make you feel nauseous or bloated. Drink plenty of fluids but you should avoid alcoholic beverages for 24 hours. If you had an esophageal dilation, please see attached information for diet.   ACTIVITY: Your care partner should take you home directly after the procedure. You should plan to take it easy, moving slowly for the rest of the day. You  can resume normal activity the day after the procedure however YOU SHOULD NOT DRIVE, use power tools, machinery or perform tasks that involve climbing or major physical exertion for 24 hours (because of the sedation medicines used during the test).   SYMPTOMS TO REPORT IMMEDIATELY: A gastroenterologist can be reached at any hour. Please call [phone]  for any of the following symptoms:  Following lower endoscopy (colonoscopy, flexible sigmoidoscopy) Excessive amounts of blood in the stool  Significant tenderness, worsening of abdominal pains  Swelling of the abdomen that is new, acute  Fever of 100 or higher  Following upper endoscopy (EGD, EUS, ERCP, esophageal dilation) Vomiting of blood or coffee ground material  New, significant abdominal pain  New, significant chest pain or pain under the shoulder blades  Painful or persistently difficult swallowing  New shortness of breath  Black, tarry-looking or red, bloody stools  FOLLOW UP:  If any biopsies were taken you will be contacted by phone or by letter within the next 1-3 weeks. Call [phone]  if you have not heard about the biopsies in 3 weeks.  Please also call with any specific questions about appointments or follow up tests.  

## 2016-09-26 NOTE — H&P (Signed)
Rhonda Spencer HPI: 76 year old female recently identified to have abnormal liver enzymes.  The enzymes were in an obstructive pattern.  A CT scan revealed a double duct sign, but no evidence of any pancreatic or distal CBD masses.  She is s/p cholecystectomy.  Past Medical History:  Diagnosis Date  . AI (aortic insufficiency)   . History of nuclear stress test 11/2008   bruce myoview; normal pattern of perfusion in allregions, no significant wall motion abnormalities; no ECG changes, no significant ischemia demonstrated, low risk scan   . Hypertension   . MR (mitral regurgitation)   . Rheumatic fever   . Rheumatic heart disease   . Rheumatoid arthritis (Rhonda Spencer)   . Sinus bradycardia     Past Surgical History:  Procedure Laterality Date  . ABDOMINAL HYSTERECTOMY    . CHOLECYSTECTOMY    . COLON SURGERY    . EXCISIONAL HEMORRHOIDECTOMY    . HEMI-MICRODISCECTOMY LUMBAR LAMINECTOMY LEVEL 4  1995  . TRANSTHORACIC ECHOCARDIOGRAM  11/2011   EF=>55%; LA mild-mod dilated; mod-severe MR; mild-mod TR with normal systolic pressure; mild-mod AV regurg; mild pulm valve regurg     Family History  Problem Relation Age of Onset  . Hypertension Mother   . Stroke Mother   . Heart disease Mother   . Pneumonia Father   . Bone cancer Maternal Uncle   . Diabetes Sister        x2; one was step-sister  . Tuberculosis Sister        step-sister  . Diabetes Son   . Diabetes Daughter     Social History:  reports that she has never smoked. She has never used smokeless tobacco. She reports that she does not drink alcohol or use drugs.  Allergies:  Allergies  Allergen Reactions  . Tarka [Trandolapril-Verapamil Hcl Er] Swelling    Slowed heart rate Pt reports allergy to any cardiac medication ending in "-il"  . Atorvastatin Other (See Comments)    states it lowered her heart rate  . Contrast Media [Iodinated Diagnostic Agents] Itching    Medications:  Scheduled:  Continuous: . sodium chloride     . lactated ringers 1,000 mL (09/26/16 0746)    No results found for this or any previous visit (from the past 24 hour(s)).   Ct Abdomen Pelvis W Contrast  Result Date: 09/24/2016 CLINICAL DATA:  Chronic elevated LFTs and epigastric abdominal pain. 10 pound of weight loss over the past 3 months. Initial encounter. EXAM: CT ABDOMEN AND PELVIS WITH CONTRAST TECHNIQUE: Multidetector CT imaging of the abdomen and pelvis was performed using the standard protocol following bolus administration of intravenous contrast. CONTRAST:  149mL ISOVUE-300 IOPAMIDOL (ISOVUE-300) INJECTION 61% COMPARISON:  CT of the abdomen and pelvis performed 08/25/2012 FINDINGS: Lower chest: Mild left basilar scarring is noted. The visualized portions of the mediastinum are unremarkable. Hepatobiliary: There is marked prominence of the intrahepatic biliary ducts, status post cholecystectomy. The common bile duct measures 2.4 cm in diameter, while the pancreatic duct measures 1.2 cm. This may simply reflect prior cholecystectomy, though would correlate clinically for any evidence of postcholecystectomy syndrome. Two large hepatic cysts are again noted, the larger of which measures 6.6 cm in size. Pancreas: There is diffuse atrophy of the pancreas, with diffuse dilatation of the pancreatic duct, as described above. Spleen: The spleen is unremarkable in appearance. Adrenals/Urinary Tract: The adrenal glands are unremarkable in appearance. A tiny left renal cyst is noted. There is no evidence of hydronephrosis. No renal or  ureteral stones are identified. No perinephric stranding is seen. Stomach/Bowel: There is distention of the appendix to 1.0 cm in diameter; this is relatively stable from 2014 and thought to reflect the patient's baseline. The colon is grossly unremarkable in appearance. The small bowel is unremarkable. The stomach is partially filled with contrast and within normal limits. Vascular/Lymphatic: The abdominal aorta is  unremarkable in appearance. The inferior vena cava is grossly unremarkable. No retroperitoneal lymphadenopathy is seen. No pelvic sidewall lymphadenopathy is identified. Reproductive: The bladder is decompressed and not well assessed. The patient is status post hysterectomy. No suspicious adnexal masses are seen. Other: No additional soft tissue abnormalities are seen. Musculoskeletal: No acute osseous abnormalities are identified. There is grade 1 anterolisthesis of L4 on L5, reflecting underlying facet disease. Multilevel vacuum phenomenon is noted at the lower lumbar spine. The visualized musculature is unremarkable in appearance. IMPRESSION: 1. Marked prominence of intrahepatic biliary ducts, the common bile duct and pancreatic duct. This may simply reflect prior cholecystectomy, though would correlate clinically for any evidence of postcholecystectomy syndrome. 2. Large hepatic cysts again noted, measuring up to 6.6 cm in size. 3. Tiny left renal cyst noted. 4. Mild degenerative change at the lower lumbar spine. Electronically Signed   By: Rhonda Spencer M.D.   On: 09/24/2016 17:03    ROS:  As stated above in the HPI otherwise negative.  Blood pressure (!) 157/66, pulse (!) 53, temperature 97.9 F (36.6 C), temperature source Oral, resp. rate 12, height 5\' 5"  (1.651 m), weight 64.9 kg (143 lb), SpO2 99 %.    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  Plan: 1) EUS.  Rhonda Spencer D 09/26/2016, 8:30 AM

## 2016-09-26 NOTE — Op Note (Signed)
[facility] Patient Name: Rhonda Spencer Procedure Date: 09/26/2016 MRN: [id number] Attending MD: Carol Ada , MD Date of Birth: 1941-02-22 CSN: [id number] Age: 76 Admit Type: Outpatient Procedure:                Upper EUS Indications:              Obstruction of bile duct on CT Providers:                Carol Ada, MD, Laverta Baltimore RN, RN, Alan Mulder, Technician Referring MD:              Medicines:                Fentanyl 75 micrograms IV, Midazolam 8 mg IV,                            Diphenhydramine 26.3 mg IV Complications:            No immediate complications. Estimated Blood Loss:     Estimated blood loss: none. Procedure:                Pre-Anesthesia Assessment:                           - Prior to the procedure, a History and Physical                            was performed, and patient medications and                            allergies were reviewed. The patient's tolerance of                            previous anesthesia was also reviewed. The risks                            and benefits of the procedure and the sedation                            options and risks were discussed with the patient.                            All questions were answered, and informed consent                            was obtained. Prior Anticoagulants: The patient has                            taken no previous anticoagulant or antiplatelet                            agents. ASA Grade Assessment: II - A patient with  mild systemic disease. After reviewing the risks                            and benefits, the patient was deemed in                            satisfactory condition to undergo the procedure.                           - Sedation was administered by an endoscopy nurse.                            The sedation level attained was moderate.                           After obtaining informed consent,  the endoscope was                            passed under direct vision. Throughout the                            procedure, the patient's blood pressure, pulse, and                            oxygen saturations were monitored continuously. The                            Endosonoscope was introduced through the mouth, and                            advanced to the second part of duodenum. The upper                            EUS was technically difficult and complex. The                            patient tolerated the procedure well. Scope In: Scope Out: Findings:      Endoscopic Finding :      The examined esophagus was endoscopically normal.      The entire examined stomach was endoscopically normal.      The examined duodenum was endoscopically normal.      Endosonographic Finding :      There was dilation in the common bile duct and in the intrahepatic bile       duct(s) which measured up to 22 mm.      A cyst was found in the left lobe of the liver and measured 33 mm by 32       mm in maximal cross-sectional diameter. The cyst was anechoic. It was       without septae. The outer wall of the lesion was thin.      The pancreatic duct had a dilated endosonographic appearance in the main       pancreatic duct. The pancreatic duct measured up to 17 mm in diameter.      The CBD and PD were massively dilated as described  above. There was no       evidence of any sludge or stones in the CBD. The pancreatic parenchyma       appeared to be normal and atrophic. The PD at the head of the pancreas       measured 17 mm, which was the largest dimension. There was no clear       evidence of a pancreatic head mass, but there was some suspicion in the       head of the pancreas. It was difficult to discern in this area as the       hypoechogenicity may be secondary to the ventral portion of the pancreas       rather than a true hypoechoic lesion. I did not feel comfortable with       passing a  needle through this area. In the PD I did not identify any       evidence of any mural nodules. Clear endoscopic visualization of the       ampulla was obtained and there was no evidenc of mucin extruding from       the duct. Impression:               - Normal esophagus.                           - Normal stomach.                           - Normal examined duodenum.                           - There was dilation in the common bile duct and in                            the intrahepatic bile duct(s) which measured up to                            22 mm.                           - A cyst was found in the left lobe of the liver                            and measured 33 mm by 32 mm.                           - The pancreatic duct had a dilated endosonographic                            appearance in the main pancreatic duct. The                            pancreatic duct measured up to 17 mm in diameter.                           - No specimens collected. Moderate Sedation:      Moderate (conscious) sedation was administered by the endoscopy nurse  and supervised by the endoscopist. The following parameters were       monitored: oxygen saturation, heart rate, blood pressure, and response       to care. Recommendation:           - Patient has a contact number available for                            emergencies. The signs and symptoms of potential                            delayed complications were discussed with the                            patient. Return to normal activities tomorrow.                            Written discharge instructions were provided to the                            patient.                           - Resume previous diet.                           - Perform an ERCP at the next available appointment. Procedure Code(s):        --- Professional ---                           [phone], Esophagogastroduodenoscopy, flexible,                             transoral; with endoscopic ultrasound examination                            limited to the esophagus, stomach or duodenum, and                            adjacent structures Diagnosis Code(s):        --- Professional ---                           K83.8, Other specified diseases of biliary tract                           K76.89, Other specified diseases of liver                           K83.1, Obstruction of bile duct                           R93.3, Abnormal findings on diagnostic imaging of                            other parts of digestive tract CPT copyright 2016 American Medical Association. All rights reserved. The codes  documented in this report are preliminary and upon coder review may  be revised to meet current compliance requirements. Carol Ada, MD Carol Ada, MD 09/26/2016 9:30:54 AM This report has been signed electronically. Number of Addenda: 0

## 2016-09-29 ENCOUNTER — Encounter (HOSPITAL_COMMUNITY): Payer: Self-pay | Admitting: Gastroenterology

## 2016-09-30 ENCOUNTER — Encounter (HOSPITAL_COMMUNITY): Payer: Self-pay | Admitting: *Deleted

## 2016-10-02 ENCOUNTER — Encounter (HOSPITAL_COMMUNITY): Payer: Self-pay | Admitting: Anesthesiology

## 2016-10-02 NOTE — Anesthesia Preprocedure Evaluation (Addendum)
Anesthesia Evaluation  Patient identified by MRN, date of birth, ID band Patient awake    Reviewed: Allergy & Precautions, NPO status , Patient's Chart, lab work & pertinent test results  Airway Mallampati: I       Dental  (+) Poor Dentition, Missing, Dental Advisory Given   Pulmonary neg pulmonary ROS,    Pulmonary exam normal breath sounds clear to auscultation       Cardiovascular hypertension, Normal cardiovascular exam Rhythm:Regular Rate:Normal     Neuro/Psych negative neurological ROS  negative psych ROS   GI/Hepatic Neg liver ROS, GERD  Medicated and Controlled,  Endo/Other  negative endocrine ROS  Renal/GU negative Renal ROS     Musculoskeletal   Abdominal Normal abdominal exam  (+)   Peds  Hematology negative hematology ROS (+)   Anesthesia Other Findings [name]  ECHO COMPLETE WO IMAGE ENHANCING AGENT  Order# [id number]  Reading physician: Josue Hector, MD Ordering physician: Waldemar Dickens, MD Study date: 09/23/15 Study Result   Result status: Final result                             *[facility] [employer].                        [place], [place] [address]                            [phone]  ------------------------------------------------------------------- Transthoracic Echocardiography  Patient:    Rhonda Spencer, Rhonda Spencer MR #:       [id number] Study Date: 09/23/2015 Gender:     F Age:        76 Height:     165.1 cm Weight:     68.9 kg BSA:        1.79 m^2 Pt. Status: Room:       [place], [place]  ADMITTING    Charlett Nose  REFERRING    Waldemar Dickens  PERFORMING   [facility], Inpatient  SONOGRAPHER  Mikki Santee  cc:  ------------------------------------------------------------------- LV EF: 55% -    60%  ------------------------------------------------------------------- Indications:      Murmur 785.2.  ------------------------------------------------------------------- History:   Risk factors:  Rheumatoid heart disease. Hypertension.   ------------------------------------------------------------------- Study Conclusions  - Left ventricle: The cavity size was normal. Wall thickness was   increased in a pattern of moderate LVH. Systolic function was   normal. The estimated ejection fraction was in the range of 55%   to 60%. Left ventricular diastolic function parameters were   normal. - Aortic valve: There was mild regurgitation. - Mitral valve: There was mild regurgitation. - Right atrium: The atrium was mildly dilated. - Atrial septum: No defect or patent foramen ovale was identified. - Tricuspid valve: There was moderate regurgitation.  Transthoracic echocardiography.  M-mode, complete 2D, spectral Doppler, and color Doppler.  Birthdate:  Patient birthdate: Jun 16, 1940.  Age:  Patient is 76 yr old.  Sex:  Gender: female. BMI: 25.3 kg/m    Reproductive/Obstetrics  Anesthesia Physical Anesthesia Plan  ASA: III  Anesthesia Plan: General   Post-op Pain Management:    Induction: Intravenous  PONV Risk Score and Plan: 3 and Ondansetron, Dexamethasone, Propofol, Midazolam and Treatment may vary due to age or medical condition  Airway Management Planned: Oral ETT  Additional Equipment:   Intra-op Plan:   Post-operative Plan: Extubation in OR  Informed Consent: I have reviewed the patients History and Physical, chart, labs and discussed the procedure including the risks, benefits and alternatives for the proposed anesthesia with the patient or authorized representative who has indicated his/her understanding and acceptance.     Plan Discussed with: CRNA and Surgeon  Anesthesia Plan Comments:       Anesthesia  Quick Evaluation

## 2016-10-03 ENCOUNTER — Ambulatory Visit (HOSPITAL_COMMUNITY): Payer: Medicare Other | Admitting: Anesthesiology

## 2016-10-03 ENCOUNTER — Ambulatory Visit (HOSPITAL_COMMUNITY): Payer: Medicare Other

## 2016-10-03 ENCOUNTER — Ambulatory Visit (HOSPITAL_COMMUNITY)
Admission: RE | Admit: 2016-10-03 | Discharge: 2016-10-03 | Disposition: A | Discharge status: Home / Self Care | Payer: Medicare Other | Source: Ambulatory Visit | Attending: Gastroenterology | Admitting: Gastroenterology

## 2016-10-03 ENCOUNTER — Encounter (HOSPITAL_COMMUNITY): Payer: Self-pay

## 2016-10-03 ENCOUNTER — Encounter (HOSPITAL_COMMUNITY)
Admission: RE | Disposition: A | Discharge status: Home / Self Care | Payer: Self-pay | Source: Ambulatory Visit | Attending: Gastroenterology

## 2016-10-03 DIAGNOSIS — K219 Gastro-esophageal reflux disease without esophagitis: Secondary | ICD-10-CM | POA: Insufficient documentation

## 2016-10-03 DIAGNOSIS — R932 Abnormal findings on diagnostic imaging of liver and biliary tract: Secondary | ICD-10-CM | POA: Diagnosis not present

## 2016-10-03 DIAGNOSIS — K8689 Other specified diseases of pancreas: Secondary | ICD-10-CM

## 2016-10-03 DIAGNOSIS — Z888 Allergy status to other drugs, medicaments and biological substances status: Secondary | ICD-10-CM | POA: Diagnosis not present

## 2016-10-03 DIAGNOSIS — Z823 Family history of stroke: Secondary | ICD-10-CM | POA: Diagnosis not present

## 2016-10-03 DIAGNOSIS — Z91041 Radiographic dye allergy status: Secondary | ICD-10-CM | POA: Diagnosis not present

## 2016-10-03 DIAGNOSIS — I080 Rheumatic disorders of both mitral and aortic valves: Secondary | ICD-10-CM | POA: Diagnosis not present

## 2016-10-03 DIAGNOSIS — M069 Rheumatoid arthritis, unspecified: Secondary | ICD-10-CM | POA: Insufficient documentation

## 2016-10-03 DIAGNOSIS — I10 Essential (primary) hypertension: Secondary | ICD-10-CM | POA: Insufficient documentation

## 2016-10-03 DIAGNOSIS — I081 Rheumatic disorders of both mitral and tricuspid valves: Secondary | ICD-10-CM | POA: Diagnosis not present

## 2016-10-03 DIAGNOSIS — Z8249 Family history of ischemic heart disease and other diseases of the circulatory system: Secondary | ICD-10-CM | POA: Insufficient documentation

## 2016-10-03 DIAGNOSIS — K831 Obstruction of bile duct: Secondary | ICD-10-CM | POA: Insufficient documentation

## 2016-10-03 DIAGNOSIS — R1031 Right lower quadrant pain: Secondary | ICD-10-CM | POA: Diagnosis not present

## 2016-10-03 DIAGNOSIS — C250 Malignant neoplasm of head of pancreas: Secondary | ICD-10-CM | POA: Insufficient documentation

## 2016-10-03 DIAGNOSIS — Z79899 Other long term (current) drug therapy: Secondary | ICD-10-CM | POA: Diagnosis not present

## 2016-10-03 DIAGNOSIS — Z9049 Acquired absence of other specified parts of digestive tract: Secondary | ICD-10-CM | POA: Insufficient documentation

## 2016-10-03 DIAGNOSIS — Z955 Presence of coronary angioplasty implant and graft: Secondary | ICD-10-CM | POA: Diagnosis not present

## 2016-10-03 HISTORY — PX: ERCP: SHX5425

## 2016-10-03 HISTORY — PX: UPPER ESOPHAGEAL ENDOSCOPIC ULTRASOUND (EUS): SHX6562

## 2016-10-03 SURGERY — UPPER ESOPHAGEAL ENDOSCOPIC ULTRASOUND (EUS)
Anesthesia: General

## 2016-10-03 MED ORDER — ONDANSETRON HCL 4 MG/2ML IJ SOLN
INTRAMUSCULAR | Status: DC | PRN
  Administered 2016-10-03: 4 mg via INTRAVENOUS

## 2016-10-03 MED ORDER — PROPOFOL 10 MG/ML IV BOLUS
INTRAVENOUS | Status: DC | PRN
  Administered 2016-10-03: 100 mg via INTRAVENOUS

## 2016-10-03 MED ORDER — INDOMETHACIN 50 MG RE SUPP
RECTAL | Status: AC
  Filled 2016-10-03: qty 2

## 2016-10-03 MED ORDER — LIDOCAINE 2% (20 MG/ML) 5 ML SYRINGE
INTRAMUSCULAR | Status: DC | PRN
  Administered 2016-10-03: 70 mg via INTRAVENOUS

## 2016-10-03 MED ORDER — SODIUM CHLORIDE 0.9 % IV SOLN: INTRAVENOUS | Status: DC

## 2016-10-03 MED ORDER — CIPROFLOXACIN IN D5W 400 MG/200ML IV SOLN
INTRAVENOUS | Status: AC
  Filled 2016-10-03: qty 200

## 2016-10-03 MED ORDER — CIPROFLOXACIN IN D5W 400 MG/200ML IV SOLN
INTRAVENOUS | Status: DC | PRN
  Administered 2016-10-03: 400 mg via INTRAVENOUS

## 2016-10-03 MED ORDER — PHENYLEPHRINE 40 MCG/ML (10ML) SYRINGE FOR IV PUSH (FOR BLOOD PRESSURE SUPPORT)
PREFILLED_SYRINGE | INTRAVENOUS | Status: DC | PRN
  Administered 2016-10-03: 40 ug via INTRAVENOUS
  Administered 2016-10-03: 80 ug via INTRAVENOUS
  Administered 2016-10-03: 40 ug via INTRAVENOUS
  Administered 2016-10-03: 80 ug via INTRAVENOUS

## 2016-10-03 MED ORDER — SUGAMMADEX SODIUM 200 MG/2ML IV SOLN
INTRAVENOUS | Status: AC
Start: 2016-10-03 — End: 2016-10-03
  Filled 2016-10-03: qty 2

## 2016-10-03 MED ORDER — SUGAMMADEX SODIUM 200 MG/2ML IV SOLN
INTRAVENOUS | Status: DC | PRN
  Administered 2016-10-03: 130 mg via INTRAVENOUS

## 2016-10-03 MED ORDER — SUCCINYLCHOLINE CHLORIDE 200 MG/10ML IV SOSY
PREFILLED_SYRINGE | INTRAVENOUS | Status: AC
  Filled 2016-10-03: qty 10

## 2016-10-03 MED ORDER — DEXAMETHASONE SODIUM PHOSPHATE 10 MG/ML IJ SOLN
INTRAMUSCULAR | Status: DC | PRN
  Administered 2016-10-03: 10 mg via INTRAVENOUS

## 2016-10-03 MED ORDER — PROPOFOL 10 MG/ML IV BOLUS
INTRAVENOUS | Status: AC
  Filled 2016-10-03: qty 20

## 2016-10-03 MED ORDER — LACTATED RINGERS IV SOLN
INTRAVENOUS | Status: DC
  Administered 2016-10-03: 08:00:00 via INTRAVENOUS

## 2016-10-03 MED ORDER — EPHEDRINE SULFATE-NACL 50-0.9 MG/10ML-% IV SOSY
PREFILLED_SYRINGE | INTRAVENOUS | Status: DC | PRN
  Administered 2016-10-03: 80 mg via INTRAVENOUS

## 2016-10-03 MED ORDER — ONDANSETRON HCL 4 MG/2ML IJ SOLN
INTRAMUSCULAR | Status: AC
  Filled 2016-10-03: qty 2

## 2016-10-03 MED ORDER — FENTANYL CITRATE (PF) 100 MCG/2ML IJ SOLN
INTRAMUSCULAR | Status: DC | PRN
Start: 2016-10-03 — End: 2016-10-03
  Administered 2016-10-03 (×2): 50 ug via INTRAVENOUS

## 2016-10-03 MED ORDER — SODIUM CHLORIDE 0.9 % IV SOLN
INTRAVENOUS | Status: DC | PRN
  Administered 2016-10-03: 35 mL

## 2016-10-03 MED ORDER — INDOMETHACIN 50 MG RE SUPP
RECTAL | Status: DC | PRN
  Administered 2016-10-03 (×2): 50 mg via RECTAL

## 2016-10-03 MED ORDER — FENTANYL CITRATE (PF) 100 MCG/2ML IJ SOLN
INTRAMUSCULAR | Status: AC
  Filled 2016-10-03: qty 2

## 2016-10-03 MED ORDER — ROCURONIUM BROMIDE 50 MG/5ML IV SOSY
PREFILLED_SYRINGE | INTRAVENOUS | Status: DC | PRN
  Administered 2016-10-03: 30 mg via INTRAVENOUS

## 2016-10-03 MED ORDER — SUCCINYLCHOLINE CHLORIDE 200 MG/10ML IV SOSY
PREFILLED_SYRINGE | INTRAVENOUS | Status: DC | PRN
  Administered 2016-10-03: 100 mg via INTRAVENOUS

## 2016-10-03 MED ORDER — GLUCAGON HCL RDNA (DIAGNOSTIC) 1 MG IJ SOLR
INTRAMUSCULAR | Status: AC
  Filled 2016-10-03: qty 1

## 2016-10-03 MED ORDER — ROCURONIUM BROMIDE 50 MG/5ML IV SOSY
PREFILLED_SYRINGE | INTRAVENOUS | Status: AC
  Filled 2016-10-03: qty 5

## 2016-10-03 MED ORDER — LIDOCAINE 2% (20 MG/ML) 5 ML SYRINGE
INTRAMUSCULAR | Status: AC
  Filled 2016-10-03: qty 5

## 2016-10-03 NOTE — Op Note (Signed)
[facility] Patient Name: Rhonda Spencer Procedure Date: 10/03/2016 MRN: [id number] Attending MD: Carol Ada , MD Date of Birth: 12/20/1940 CSN: [id number] Age: 76 Admit Type: Outpatient Procedure:                ERCP Indications:              Malignant tumor of the head of pancreas Providers:                Carol Ada, MD, Burtis Junes, RN, Cletis [place],                            Technician Referring MD:              Medicines:                General Anesthesia Complications:            No immediate complications. Estimated Blood Loss:     Estimated blood loss was minimal. Procedure:                Pre-Anesthesia Assessment:                           - Prior to the procedure, a History and Physical                            was performed, and patient medications and                            allergies were reviewed. The patient's tolerance of                            previous anesthesia was also reviewed. The risks                            and benefits of the procedure and the sedation                            options and risks were discussed with the patient.                            All questions were answered, and informed consent                            was obtained. Prior Anticoagulants: The patient has                            taken no previous anticoagulant or antiplatelet                            agents. ASA Grade Assessment: III - A patient with                            severe systemic disease. After reviewing the risks  and benefits, the patient was deemed in                            satisfactory condition to undergo the procedure.                           - Sedation was administered by an anesthesia                            professional. General anesthesia was attained.                           After obtaining informed consent, the scope was                            passed under direct vision. Throughout  the                            procedure, the patient's blood pressure, pulse, and                            oxygen saturations were monitored continuously. The                            [id number]) scope was introduced through                            the mouth, and used to inject contrast into and                            used to inject contrast into the bile duct. Scope In: Scope Out: Findings:      The major papilla was normal. The bile duct was easily and deeply       cannulated with the short-nosed traction sphincterotome with the initial       attempt. Contrast was injected. I personally interpreted the bile duct       images. There was brisk flow of contrast through the ducts. Image       quality was excellent. Contrast extended to the bifurcation. The common       bile duct was diffusely dilated, with a mass causing an obstruction. The       largest diameter was 18 mm. The lower third of the main bile duct       contained a single localized stenosis 20 mm in length. A short 0.035       inch Soft Jagwire was passed into the biliary tree. A 10 mm biliary       sphincterotomy was made with a monofilament traction (standard)       sphincterotome using ERBE electrocautery. The sphincterotomy oozed       blood. One 10 mm by 4 cm covered metal stent was placed 2 cm into the       common bile duct. Clear fluid flowed through the stent. The stent was in       good position. Impression:               - The major papilla appeared normal.                           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 The                            stricture was malignant appearing secondary to the                            extrinsic compression.                           - The common bile duct was dilated, with a mass                            causing an obstruction.                           - A biliary sphincterotomy was performed.                           - One covered metal stent  was placed into the                            common bile duct. Moderate Sedation:      N/A- Per Anesthesia Care Recommendation:           - Patient has a contact number available for                            emergencies. The signs and symptoms of potential                            delayed complications were discussed with the                            patient. Return to normal activities tomorrow.                            Written discharge instructions were provided to the                            patient. Procedure Code(s):        --- Professional ---                           [phone], Endoscopic retrograde                            cholangiopancreatography (ERCP); with placement of                            endoscopic stent into biliary or pancreatic duct,                            including pre- and post-dilation and guide wire                            passage, when performed, including sphincterotomy,  when performed, each stent                           [phone], Endoscopic catheterization of the biliary                            ductal system, radiological supervision and                            interpretation Diagnosis Code(s):        --- Professional ---                           K83.1, Obstruction of bile duct                           C25.0, Malignant neoplasm of head of pancreas CPT copyright 2016 American Medical Association. All rights reserved. The codes documented in this report are preliminary and upon coder review may  be revised to meet current compliance requirements. Carol Ada, MD Carol Ada, MD 10/03/2016 10:09:50 AM This report has been signed electronically. Number of Addenda: 0

## 2016-10-03 NOTE — Discharge Instructions (Signed)
Endoscopic Retrograde Cholangiopancreatogram, Care After °This sheet gives you information about how to care for yourself after your procedure. Your health care provider may also give you more specific instructions. If you have problems or questions, contact your health care provider. °What can I expect after the procedure? °After the procedure, it is common to have: °· Soreness in your throat. °· Nausea. °· Bloating. °· Dizziness. °· Tiredness (fatigue). ° °Follow these instructions at home: °· Take over-the-counter and prescription medicines only as told by your health care provider. °· Do not drive for 24 hours if you were given a medicine to help you relax (sedative) during your procedure. Have someone stay with you for 24 hours after the procedure. °· Return to your normal activities as told by your health care provider. Ask your health care provider what activities are safe for you. °· Return to eating what you normally do as soon as you feel well enough or as told by your health care provider. °· Keep all follow-up visits as told by your health care provider. This is important. °Contact a health care provider if: °· You have pain in your abdomen that does not get better with medicine. °· You develop signs of infection, such as: °? Chills. °? Feeling unwell. °Get help right away if: °· You have difficulty swallowing. °· You have worsening pain in your throat, chest, or abdomen. °· You vomit bright red blood or a substance that looks like coffee grounds. °· You have bloody or very black stools. °· You have a fever. °· You have a sudden increase in swelling (bloating) in your abdomen. °Summary °· After the procedure, it is common to feel tired and to have some discomfort in your throat. °· Contact your health care provider if you have signs of infection--such as chills or feeling unwell--or if you have pain that does not improve with medicine. °· Get help right away if you have trouble swallowing, worsening  pain, bloody or black vomit, bloody or black stools, a fever, or increased swelling in your abdomen. °· Keep all follow-up visits as told by your health care provider. This is important. °This information is not intended to replace advice given to you by your health care provider. Make sure you discuss any questions you have with your health care provider. °Document Released: 01/12/2013 Document Revised: 02/11/2016 Document Reviewed: 02/11/2016 °Elsevier Interactive Patient Education © 2017 Elsevier Inc. ° °

## 2016-10-03 NOTE — H&P (View-Only) (Signed)
Rhonda Spencer HPI: 76 year old female recently identified to have abnormal liver enzymes.  The enzymes were in an obstructive pattern.  A CT scan revealed a double duct sign, but no evidence of any pancreatic or distal CBD masses.  She is s/p cholecystectomy.  Past Medical History:  Diagnosis Date  . AI (aortic insufficiency)   . History of nuclear stress test 11/2008   bruce myoview; normal pattern of perfusion in allregions, no significant wall motion abnormalities; no ECG changes, no significant ischemia demonstrated, low risk scan   . Hypertension   . MR (mitral regurgitation)   . Rheumatic fever   . Rheumatic heart disease   . Rheumatoid arthritis ([place])   . Sinus bradycardia     Past Surgical History:  Procedure Laterality Date  . ABDOMINAL HYSTERECTOMY    . CHOLECYSTECTOMY    . COLON SURGERY    . EXCISIONAL HEMORRHOIDECTOMY    . HEMI-MICRODISCECTOMY LUMBAR LAMINECTOMY LEVEL 4  1995  . TRANSTHORACIC ECHOCARDIOGRAM  11/2011   EF=>55%; LA mild-mod dilated; mod-severe MR; mild-mod TR with normal systolic pressure; mild-mod AV regurg; mild pulm valve regurg     Family History  Problem Relation Age of Onset  . Hypertension Mother   . Stroke Mother   . Heart disease Mother   . Pneumonia Father   . Bone cancer Maternal Uncle   . Diabetes Sister        x2; one was step-sister  . Tuberculosis Sister        step-sister  . Diabetes Son   . Diabetes Daughter     Social History:  reports that she has never smoked. She has never used smokeless tobacco. She reports that she does not drink alcohol or use drugs.  Allergies:  Allergies  Allergen Reactions  . Tarka [Trandolapril-Verapamil Hcl Er] Swelling    Slowed heart rate Pt reports allergy to any cardiac medication ending in "-il"  . Atorvastatin Other (See Comments)    states it lowered her heart rate  . Contrast Media [Iodinated Diagnostic Agents] Itching    Medications:  Scheduled:  Continuous: . sodium chloride     . lactated ringers 1,000 mL (09/26/16 0746)    No results found for this or any previous visit (from the past 24 hour(s)).   Ct Abdomen Pelvis W Contrast  Result Date: 09/24/2016 CLINICAL DATA:  Chronic elevated LFTs and epigastric abdominal pain. 10 pound of weight loss over the past 3 months. Initial encounter. EXAM: CT ABDOMEN AND PELVIS WITH CONTRAST TECHNIQUE: Multidetector CT imaging of the abdomen and pelvis was performed using the standard protocol following bolus administration of intravenous contrast. CONTRAST:  132mL ISOVUE-300 IOPAMIDOL (ISOVUE-300) INJECTION 61% COMPARISON:  CT of the abdomen and pelvis performed 08/25/2012 FINDINGS: Lower chest: Mild left basilar scarring is noted. The visualized portions of the mediastinum are unremarkable. Hepatobiliary: There is marked prominence of the intrahepatic biliary ducts, status post cholecystectomy. The common bile duct measures 2.4 cm in diameter, while the pancreatic duct measures 1.2 cm. This may simply reflect prior cholecystectomy, though would correlate clinically for any evidence of postcholecystectomy syndrome. Two large hepatic cysts are again noted, the larger of which measures 6.6 cm in size. Pancreas: There is diffuse atrophy of the pancreas, with diffuse dilatation of the pancreatic duct, as described above. Spleen: The spleen is unremarkable in appearance. Adrenals/Urinary Tract: The adrenal glands are unremarkable in appearance. A tiny left renal cyst is noted. There is no evidence of hydronephrosis. No renal or  ureteral stones are identified. No perinephric stranding is seen. Stomach/Bowel: There is distention of the appendix to 1.0 cm in diameter; this is relatively stable from 2014 and thought to reflect the patient's baseline. The colon is grossly unremarkable in appearance. The small bowel is unremarkable. The stomach is partially filled with contrast and within normal limits. Vascular/Lymphatic: The abdominal aorta is  unremarkable in appearance. The inferior vena cava is grossly unremarkable. No retroperitoneal lymphadenopathy is seen. No pelvic sidewall lymphadenopathy is identified. Reproductive: The bladder is decompressed and not well assessed. The patient is status post hysterectomy. No suspicious adnexal masses are seen. Other: No additional soft tissue abnormalities are seen. Musculoskeletal: No acute osseous abnormalities are identified. There is grade 1 anterolisthesis of L4 on L5, reflecting underlying facet disease. Multilevel vacuum phenomenon is noted at the lower lumbar spine. The visualized musculature is unremarkable in appearance. IMPRESSION: 1. Marked prominence of intrahepatic biliary ducts, the common bile duct and pancreatic duct. This may simply reflect prior cholecystectomy, though would correlate clinically for any evidence of postcholecystectomy syndrome. 2. Large hepatic cysts again noted, measuring up to 6.6 cm in size. 3. Tiny left renal cyst noted. 4. Mild degenerative change at the lower lumbar spine. Electronically Signed   By: Garald Balding M.D.   On: 09/24/2016 17:03    ROS:  As stated above in the HPI otherwise negative.  Blood pressure (!) 157/66, pulse (!) 53, temperature 97.9 F (36.6 C), temperature source Oral, resp. rate 12, height 5\' 5"  (1.651 m), weight 64.9 kg (143 lb), SpO2 99 %.    PE: Gen: NAD, Alert and Oriented HEENT:  [place]/AT, EOMI, icteric sclera Neck: Supple, no LAD Lungs: CTA Bilaterally CV: RRR without M/G/R ABM: Soft, NTND, +BS Ext: No C/C/E  Assessment/Plan: 1) Double duct sign. 2) Obstructive liver disease.  Plan: 1) EUS.  Cobe Viney D 09/26/2016, 8:30 AM

## 2016-10-03 NOTE — Op Note (Addendum)
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 Estimated Blood Loss:     Estimated blood loss was minimal. Procedure:                Pre-Anesthesia Assessment:                           - Prior to the procedure, a History and Physical                            was performed, and patient medications and                            allergies were reviewed. The patient's tolerance of                            previous anesthesia was also reviewed. The risks                            and benefits of the procedure and the sedation                            options and risks were discussed with the patient.                            All questions were answered, and informed consent                            was obtained. Prior Anticoagulants: The patient has                            taken no previous anticoagulant or antiplatelet                            agents. ASA Grade Assessment: III - A patient with                            severe systemic disease. After reviewing the risks  and benefits, the patient was deemed in                            satisfactory condition to undergo the procedure.                           - Sedation was administered by an anesthesia                            professional. General anesthesia was attained.                           After obtaining informed consent, the endoscope was                            passed under direct vision.  Throughout the                            procedure, the patient's blood pressure, pulse, and                            oxygen saturations were monitored continuously. The                            Endosonoscope was introduced through the mouth, and                            advanced to the duodenal bulb. The upper EUS was                            accomplished without difficulty. The patient                            tolerated the procedure well. Scope In: Scope Out: Findings:      Endosonographic Finding :      A round mass was identified in the pancreatic head. The mass was       hypoechoic. The mass measured 24 mm by 19 mm in maximal cross-sectional       diameter. The endosonographic borders were poorly-defined. An intact       interface was seen between the mass and the celiac trunk, portal vein       and duodenum suggesting a lack of invasion. The remainder of the       pancreas was examined. The endosonographic appearance of parenchyma and       the upstream pancreatic duct indicated duct dilation, a maximum duct       diameter of 7 mm and parenchymal atrophy. Fine needle aspiration for       cytology was performed. Color Doppler imaging was utilized prior to       needle puncture to confirm a lack of significant vascular structures       within the needle path. Five passes were made with the 25 gauge needle       using a transduodenal approach. A stylet was used. A cytotechnologist       was present to evaluate the adequacy of the specimen. The  cellularity of       the specimen was adequate. Final cytology results are pending.      It is likely that the malignancy is larger than measured. The lesion was       indistinct, but with the FNA there was a course sensation with passage       of the needle. Endoscopically there appears to be some minor extrinsic       compression into the duodenal lumen. The CBD was markedly dilated and       measured to be 17-18 mm at its greatest  diameter. 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                            Fine needle aspiration performed. Moderate Sedation:      N/A- Per Anesthesia Care Recommendation:           - Convert to ERCP Procedure Code(s):        --- Professional ---                           [phone], Esophagogastroduodenoscopy, flexible,                            transoral; with transendoscopic ultrasound-guided                            intramural or transmural fine needle                            aspiration/biopsy(s), (includes endoscopic                            ultrasound examination limited to the esophagus,                            stomach or duodenum, and adjacent structures) Diagnosis Code(s):        --- Professional ---                           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 All rights reserved. The codes documented in this report are preliminary and upon coder review may  be revised to meet current compliance requirements. Carol Ada, MD Carol Ada, MD 10/03/2016 10:01:39 AM This report has been signed electronically. Number of Addenda: 0

## 2016-10-03 NOTE — Interval H&P Note (Signed)
History and Physical Interval Note:  10/03/2016 7:42 AM  Rhonda Spencer  has presented today for surgery, with the diagnosis of dilated common bile and pancreatic ducts  The various methods of treatment have been discussed with the patient and family. After consideration of risks, benefits and other options for treatment, the patient has consented to  Procedure(s): UPPER ESOPHAGEAL ENDOSCOPIC ULTRASOUND (EUS) (N/A) ENDOSCOPIC RETROGRADE CHOLANGIOPANCREATOGRAPHY (ERCP) (N/A) as a surgical intervention .  The patient's history has been reviewed, patient examined, no change in status, stable for surgery.  I have reviewed the patient's chart and labs.  Questions were answered to the patient's satisfaction.     Dallas Scorsone D

## 2016-10-03 NOTE — Anesthesia Postprocedure Evaluation (Signed)
Anesthesia Post Note  Patient: Rhonda Spencer  Procedure(s) Performed: Procedure(s) (LRB): UPPER ESOPHAGEAL ENDOSCOPIC ULTRASOUND (EUS) (N/A) ENDOSCOPIC RETROGRADE CHOLANGIOPANCREATOGRAPHY (ERCP) (N/A)     Patient location during evaluation: Endoscopy Anesthesia Type: General Level of consciousness: awake and sedated Pain management: pain level controlled Vital Signs Assessment: post-procedure vital signs reviewed and stable Respiratory status: spontaneous breathing Cardiovascular status: stable Postop Assessment: no signs of nausea or vomiting Anesthetic complications: no    Last Vitals:  Vitals:   10/03/16 1005 10/03/16 1010  BP: (!) 146/60 (!) 150/56  Pulse: 69 63  Resp: 16 14  Temp: 36.6 C     Last Pain:  Vitals:   10/03/16 0710  TempSrc: Oral   Pain Goal:                 Nabiha Planck JR,JOHN Tadd Holtmeyer

## 2016-10-03 NOTE — Anesthesia Procedure Notes (Signed)
Procedure Name: Intubation Date/Time: 10/03/2016 8:23 AM Performed by: Montel Clock Pre-anesthesia Checklist: Patient identified, Emergency Drugs available, Suction available, Patient being monitored and Timeout performed Patient Re-evaluated:Patient Re-evaluated prior to inductionOxygen Delivery Method: Circle system utilized Preoxygenation: Pre-oxygenation with 100% oxygen Intubation Type: IV induction Ventilation: Mask ventilation without difficulty Laryngoscope Size: Mac and 3 Grade View: Grade I Tube type: Oral Tube size: 7.0 mm Number of attempts: 1 Airway Equipment and Method: Stylet Placement Confirmation: ETT inserted through vocal cords under direct vision,  positive ETCO2 and breath sounds checked- equal and bilateral Secured at: 21 cm Tube secured with: Tape Dental Injury: Teeth and Oropharynx as per pre-operative assessment

## 2016-10-03 NOTE — Transfer of Care (Signed)
Immediate Anesthesia Transfer of Care Note  Patient: Rhonda Spencer  Procedure(s) Performed: Procedure(s): UPPER ESOPHAGEAL ENDOSCOPIC ULTRASOUND (EUS) (N/A) ENDOSCOPIC RETROGRADE CHOLANGIOPANCREATOGRAPHY (ERCP) (N/A)  Patient Location: ENDO  Anesthesia Type:General  Level of Consciousness:  alert, patient cooperative and responds to stimulation  Airway & Oxygen Therapy:Patient Spontanous Breathing and Patient connected to face mask oxgen  Post-op Assessment:  Report given to ENDO RN and Post -op Vital signs reviewed and stable  Post vital signs:  Reviewed and stable  Last Vitals:  Vitals:   10/03/16 0710  BP: 120/89  Pulse: (!) 59  Resp: 10  Temp: 41.6 C    Complications: No apparent anesthesia complications

## 2016-10-06 ENCOUNTER — Encounter (HOSPITAL_COMMUNITY): Payer: Self-pay | Admitting: Gastroenterology

## 2016-10-07 ENCOUNTER — Telehealth: Payer: Self-pay | Admitting: Hematology

## 2016-10-07 ENCOUNTER — Encounter: Payer: Self-pay | Admitting: Hematology

## 2016-10-07 NOTE — Telephone Encounter (Signed)
Appt has been scheduled for the pt to see Dr. Burr Medico on 7/10 at 815am. Pt aware to arrive at 8am. Letter mailed to the pt.

## 2016-10-13 NOTE — Progress Notes (Signed)
[place]  [phone]) [phone] Fax:(336) [phone]  Clinic New Consult Note   Patient Care Team: Iona Beard, MD as PCP - General (Family Medicine) 10/14/2016  Referring physician:  Dr. Benson Norway  CHIEF COMPLAINTS/PURPOSE OF CONSULTATION:   Newly diagnosed pancreatic cancer  Oncology History   Cancer Staging Pancreatic cancer [facility]) Staging form: Exocrine Pancreas, AJCC 8th Edition - Clinical stage from 10/03/2016: Stage IB (cT2, cN0, cM0) - Signed by Truitt Merle, MD on 10/18/2016       Pancreatic cancer ([place])   09/26/2016 Imaging    CT Abdomen Pelvis w contrast  IMPRESSION: 1. Marked prominence of intrahepatic biliary ducts, the common bile duct and pancreatic duct. This may simply reflect prior cholecystectomy, though would correlate clinically for any evidence of postcholecystectomy syndrome. 2. Large hepatic cysts again noted, measuring up to 6.6 cm in size. 3. Tiny left renal cyst noted. 4. Mild degenerative change at the lower lumbar spine.      09/26/2016 Procedure    EUS  - Normal esophagus. - Normal stomach. - Normal examined duodenum. - There was dilation in the common bile duct and in the intrahepatic bile duct(s) which measured up to 22 mm. - A cyst was found in the left lobe of the liver and measured 33 mm by 32 mm. - The pancreatic duct had a dilated endosonographic appearance in the main pancreatic duct. The pancreatic duct measured up to 17 mm in diameter. - No specimens collected      09/26/2016 Initial Diagnosis    Pancreatic cancer ([place])      10/03/2016 Procedure    Repeated EUS showed a 2.4cm mass in the pancreatic head. Fine needle aspiration performed. - performed by Dr.Hung       10/03/2016 Pathology Results    Diagnosis  FINE NEEDLE ASPIRATION, ENDOSCOPIC HEAD OF PANCREAS (SPECIMEN 1 OF 1, COLLECTED 10/03/16): MALIGNANT CELLS PRESENT, CONSISTENT WITH ADENOCARCINOMA.      10/03/2016 Procedure    ERCP  The major papilla  appeared normal. - A localized biliary stricture was found. The stricture was malignant appearing secondary to the extrinsic compression. - The common bile duct was dilated, with a mass causing an obstruction. - A biliary sphincterotomy was performed. - One covered metal stent was placed into the common bile duct.        HISTORY OF PRESENTING ILLNESS:  Rhonda Spencer 76 y.o. female is here because of her newly diagnosed pancreatic cancer. She was referred by her gastroenterologist Dr. Benson Norway.  She presented with low appetite, fatigue, diarrhea and 20 pounds weight loss since March. She was seen by her primary care physician and lab test showed abnormal liver functions. She was admitted to the hospital on 09/26/2016 due to recently identified abnormal liver enzymes that were in an obstructive pattern. CT of abdomen and pelvis showed up for a mildly hypo-attenuating 2.4 x 1.7 x 1.8 cm mass at the pancreatic head, with markedly dilated pancreatic duct and biliary duct dilatation. The She was seen by gastroenterology Dr. Benson Norway and underwent EUS on 10/03/2016 which showed a 2.4 x 1.9 cm mass in the head of pancreas, no ultrasound evidence of invading blood vessels or duodenum. Biopsy showed adenocarcinoma. She underwent ERCP and a covered metal stent was placed in CBD.   Today she presents to the clinic accompanied by her son and daughter. She noticed jaundice in her eyes and dark urine before the procedures. Her loss of appetite has caused her to lose 20 pounds since March. She  denies any chest pain, cough, or shortness of breath. Her last bowel movement was 1 week ago. Her energy level is lower than normal. She has back pain that can become unbearable at time and she uses Lidocaine patch to help. She denies any other pains.  She wants to leave the treatment decision up to her children.   MEDICAL HISTORY:  Past Medical History:  Diagnosis Date  . AI (aortic insufficiency)   . History of nuclear  stress test 11/2008   bruce myoview; normal pattern of perfusion in allregions, no significant wall motion abnormalities; no ECG changes, no significant ischemia demonstrated, low risk scan   . Hypertension   . MR (mitral regurgitation)   . Rheumatic fever   . Rheumatic heart disease   . Rheumatoid arthritis ([place])   . Sinus bradycardia     SURGICAL HISTORY: Past Surgical History:  Procedure Laterality Date  . ABDOMINAL HYSTERECTOMY    . CHOLECYSTECTOMY    . COLON SURGERY    . ERCP N/A 10/03/2016   Procedure: ENDOSCOPIC RETROGRADE CHOLANGIOPANCREATOGRAPHY (ERCP);  Surgeon: Carol Ada, MD;  Location: [facility];  Service: Endoscopy;  Laterality: N/A;  . EXCISIONAL HEMORRHOIDECTOMY    . HEMI-MICRODISCECTOMY LUMBAR LAMINECTOMY LEVEL 4  1995  . TRANSTHORACIC ECHOCARDIOGRAM  11/2011   EF=>55%; LA mild-mod dilated; mod-severe MR; mild-mod TR with normal systolic pressure; mild-mod AV regurg; mild pulm valve regurg   . UPPER ESOPHAGEAL ENDOSCOPIC ULTRASOUND (EUS) N/A 09/26/2016   Procedure: UPPER ESOPHAGEAL ENDOSCOPIC ULTRASOUND (EUS);  Surgeon: Carol Ada, MD;  Location: [facility];  Service: Endoscopy;  Laterality: N/A;  . UPPER ESOPHAGEAL ENDOSCOPIC ULTRASOUND (EUS) N/A 10/03/2016   Procedure: UPPER ESOPHAGEAL ENDOSCOPIC ULTRASOUND (EUS);  Surgeon: Carol Ada, MD;  Location: [facility];  Service: Endoscopy;  Laterality: N/A;    SOCIAL HISTORY: Social History   Social History  . Marital status: Single    Spouse name: N/A  . Number of children: 2  . Years of education: 14   Occupational History  . Retired    Social History Main Topics  . Smoking status: Former Smoker    Years: 8.00    Quit date: 04/08/1967  . Smokeless tobacco: Never Used  . Alcohol use No  . Drug use: No  . Sexual activity: Not on file   Other Topics Concern  . Not on file   Social History Narrative   She lives in an apartment in [place]. She has children, grandchildren and great  grandchildren in the area.    FAMILY HISTORY: Family History  Problem Relation Age of Onset  . Hypertension Mother   . Stroke Mother   . Heart disease Mother   . Pneumonia Father   . Bone cancer Maternal Uncle   . Diabetes Sister        x2; one was step-sister  . Tuberculosis Sister        step-sister  . Diabetes Son   . Diabetes Daughter     ALLERGIES:  is allergic to tarka [trandolapril-verapamil hcl er]; atorvastatin; and contrast media [iodinated diagnostic agents].  MEDICATIONS:  Current Outpatient Prescriptions  Medication Sig Dispense Refill  . aspirin 81 MG tablet Take 81 mg by mouth daily.    . betamethasone dipropionate (DIPROLENE) 0.05 % cream Apply 1 application topically daily as needed (itching).    . famotidine (PEPCID) 20 MG tablet Take 20 mg by mouth daily. May take an additional 20mg s as needed for heartburn    . felodipine (PLENDIL) 5 MG 24 hr  tablet Take 5 mg by mouth daily.    [name] ketotifen (ZADITOR) 0.025 % ophthalmic solution Place 1 drop into the left eye daily as needed (irritation).    [name] lidocaine (LIDODERM) 5 % Place 1 patch onto the skin daily as needed (back pain). Remove & Discard patch within 12 hours or as directed by MD     . Magnesium 250 MG TABS Take 250 mg by mouth daily.    [name] OVER THE COUNTER MEDICATION Take 1 capsule by mouth 2 (two) times daily. IBGard    . OVER THE COUNTER MEDICATION Take 2 capsules by mouth 2 (two) times daily as needed (ibs). FDGard     No current facility-administered medications for this visit.     REVIEW OF SYSTEMS:   Constitutional: Denies fevers, chills or abnormal night sweats (+)loss 20 pounds since March Eyes: Denies blurriness of vision, double vision or watery eyes Ears, nose, mouth, throat, and face: Denies mucositis or sore throat Respiratory: Denies cough, dyspnea or wheezes Cardiovascular: Denies palpitation, chest discomfort or lower extremity swelling Gastrointestinal:  Denies nausea, heartburn or  change in bowel habits Skin: Denies abnormal skin rashes Lymphatics: Denies new lymphadenopathy or easy bruising Neurological:Denies numbness, tingling or new weaknesses Behavioral/Psych: Mood is stable, no new changes  MSK: (+)back pain All other systems were reviewed with the patient and are negative.  PHYSICAL EXAMINATION:  ECOG PERFORMANCE STATUS: 2 - Symptomatic, <50% confined to bed  Vitals:   10/14/16 0812  BP: (!) 147/73  Pulse: (!) 50  Resp: 18  Temp: 98 F (36.7 C)   Filed Weights   10/14/16 0812  Weight: 134 lb 8 oz (61 kg)    GENERAL:alert, no distress and comfortable SKIN: skin color, texture, turgor are normal, no rashes or significant lesions EYES: normal, conjunctiva are pink and non-injected,  (+)jaundice in eyes OROPHARYNX:no exudate, no erythema and lips, buccal mucosa, and tongue normal  NECK: supple, thyroid normal size, non-tender, without nodularity LYMPH:  no palpable lymphadenopathy in the cervical, axillary or inguinal LUNGS: clear to auscultation and percussion with normal breathing effort HEART: regular rate & rhythm and no lower extremity edema (+) slight murmur  ABDOMEN:abdomen soft, non-tender and normal bowel sounds  Musculoskeletal:no cyanosis of digits and no clubbing  PSYCH: alert & oriented x 3 with fluent speech NEURO: no focal motor/sensory deficits  LABORATORY DATA:  I have reviewed the data as listed CBC Latest Ref Rng & Units 10/14/2016 09/23/2015 09/22/2015  WBC 3.9 - 10.3 10e3/uL 5.0 3.9(L) 4.9  Hemoglobin 11.6 - 15.9 g/dL 11.2(L) 11.3(L) 11.9(L)  Hematocrit 34.8 - 46.6 % 34.3(L) 34.4(L) 36.3  Platelets 145 - 400 10e3/uL 280 251 262   CMP Latest Ref Rng & Units 10/14/2016 09/23/2015 09/22/2015  Glucose 70 - 140 mg/dl 180(H) 105(H) 93  BUN 7.0 - 26.0 mg/dL 11.1 22(H) 25(H)  Creatinine 0.6 - 1.1 mg/dL 0.8 0.87 0.84  Sodium 136 - 145 mEq/L 140 140 138  Potassium 3.5 - 5.1 mEq/L 3.8 4.1 3.6  Chloride 101 - 111 mmol/L - 109 107  CO2  22 - 29 mEq/L 24 26 24   Calcium 8.4 - 10.4 mg/dL 9.6 8.8(L) 8.9  Total Protein 6.4 - 8.3 g/dL 7.4 6.8 -  Total Bilirubin 0.20 - 1.20 mg/dL 6.11(HH) 0.4 -  Alkaline Phos 40 - 150 U/L 314(H) 64 -  AST 5 - 34 U/L 81(H) 16 -  ALT 0 - 55 U/L 117(H) 10(L) -    PATHOLOGY: Diagnosis 10/03/2016 FINE NEEDLE ASPIRATION, ENDOSCOPIC  HEAD OF PANCREAS (SPECIMEN 1 OF 1, COLLECTED 10/03/16): MALIGNANT CELLS PRESENT, CONSISTENT WITH ADENOCARCINOMA. Preliminary Diagnosis Intraoperative Diagnosis: Adequate. ([facility])  RADIOGRAPHIC STUDIES: I have personally reviewed the radiological images as listed and agreed with the findings in the report. Ct Abdomen Pelvis W Contrast  Addendum Date: 09/26/2016   ADDENDUM REPORT: 09/26/2016 16:17 ADDENDUM: Note is made of a vague mildly hypoattenuating 2.4 x 1.7 x 1.8 cm mass at the pancreatic head. This is adjacent to the markedly dilated pancreatic duct, and concerning for malignancy. Mass infiltration resulting in biliary duct dilatation is possible, though not definitely seen. Would correlate with pancreatic lab values. MRCP or ERCP would be helpful for further evaluation, as deemed clinically appropriate. These results were called by telephone at the time of interpretation on 09/26/2016 at 11:38 am to Dr. Juanita Craver , who verbally acknowledged these results. Electronically Signed   By: Garald Balding M.D.   On: 09/26/2016 16:17   Result Date: 09/26/2016 CLINICAL DATA:  Chronic elevated LFTs and epigastric abdominal pain. 10 pound of weight loss over the past 3 months. Initial encounter. EXAM: CT ABDOMEN AND PELVIS WITH CONTRAST TECHNIQUE: Multidetector CT imaging of the abdomen and pelvis was performed using the standard protocol following bolus administration of intravenous contrast. CONTRAST:  161mL ISOVUE-300 IOPAMIDOL (ISOVUE-300) INJECTION 61% COMPARISON:  CT of the abdomen and pelvis performed 08/25/2012 FINDINGS: Lower chest: Mild left basilar scarring is noted. The  visualized portions of the mediastinum are unremarkable. Hepatobiliary: There is marked prominence of the intrahepatic biliary ducts, status post cholecystectomy. The common bile duct measures 2.4 cm in diameter, while the pancreatic duct measures 1.2 cm. This may simply reflect prior cholecystectomy, though would correlate clinically for any evidence of postcholecystectomy syndrome. Two large hepatic cysts are again noted, the larger of which measures 6.6 cm in size. Pancreas: There is diffuse atrophy of the pancreas, with diffuse dilatation of the pancreatic duct, as described above. Spleen: The spleen is unremarkable in appearance. Adrenals/Urinary Tract: The adrenal glands are unremarkable in appearance. A tiny left renal cyst is noted. There is no evidence of hydronephrosis. No renal or ureteral stones are identified. No perinephric stranding is seen. Stomach/Bowel: There is distention of the appendix to 1.0 cm in diameter; this is relatively stable from 2014 and thought to reflect the patient's baseline. The colon is grossly unremarkable in appearance. The small bowel is unremarkable. The stomach is partially filled with contrast and within normal limits. Vascular/Lymphatic: The abdominal aorta is unremarkable in appearance. The inferior vena cava is grossly unremarkable. No retroperitoneal lymphadenopathy is seen. No pelvic sidewall lymphadenopathy is identified. Reproductive: The bladder is decompressed and not well assessed. The patient is status post hysterectomy. No suspicious adnexal masses are seen. Other: No additional soft tissue abnormalities are seen. Musculoskeletal: No acute osseous abnormalities are identified. There is grade 1 anterolisthesis of L4 on L5, reflecting underlying facet disease. Multilevel vacuum phenomenon is noted at the lower lumbar spine. The visualized musculature is unremarkable in appearance. IMPRESSION: 1. Marked prominence of intrahepatic biliary ducts, the common bile duct  and pancreatic duct. This may simply reflect prior cholecystectomy, though would correlate clinically for any evidence of postcholecystectomy syndrome. 2. Large hepatic cysts again noted, measuring up to 6.6 cm in size. 3. Tiny left renal cyst noted. 4. Mild degenerative change at the lower lumbar spine. Electronically Signed: By: Garald Balding M.D. On: 09/24/2016 17:03   Dg Ercp Biliary & Pancreatic Ducts  Result Date: 10/03/2016 CLINICAL DATA:  ERCP with stent  placement EXAM: ERCP TECHNIQUE: Multiple spot images obtained with the fluoroscopic device and submitted for interpretation post-procedure. FLUOROSCOPY TIME:  1 minute, 42 seconds (9.98 mGy) COMPARISON:  CT abdomen and pelvis -09/24/2016 FINDINGS: Two spot intraoperative fluoroscopic images of the right upper abdominal quadrant during ERCP are provided for review Initial image demonstrates an ERCP probe overlying the right upper abdominal quadrant. Post cholecystectomy. There is selective cannulation and opacification of the distal aspect of the common bile duct. The common bile duct appears markedly dilated with abrupt tapering distally, similar to preceding abdominal CT. There is no definitive opacification of the intrahepatic biliary system, residual cystic duct or the pancreatic duct. Completion image demonstrates placement of an internal biliary stent overlying the distal aspect of the CBD with pneumobilia within the central common bile duct. IMPRESSION: ERCP with biliary stent placement as above. These images were submitted for radiologic interpretation only. Please see the procedural report for the amount of contrast and the fluoroscopy time utilized. Electronically Signed   By: Sandi Mariscal M.D.   On: 10/03/2016 10:08    ASSESSMENT & PLAN: 76 year old [place] American female, presented with fatigue, Low appetite, diarrhea and weight loss.  1.Pancreatic adenocarcinoma, in pancreatic head, cT2N0Mx -I discussed the scan and biopsy results with  patient and her family in details. -Based on the CT and endoscopy ultrasound, the pancreatic head cancer has no clear evidence of vascular invasion, is technically resectable. -I recommend a CT chest for staging, to ruled out thoracic or metastasis. -We discussed the aggressive nature history of pancreatic cancer, and the high risk of recurrence after surgical resection.  -We discussed the only curable treatment is surgical resection for early-stage pancreatic cancer. Due to the location of the mass, she would need Whipple surgery, which has high risk of surgical, medications and long postoperative recovery. She is scheduled to see pain greater surgeon Dr. Barry Dienes to discuss her surgery.  -I discussed the role of neoadjuvant and adjuvant chemotherapy. Due to her advanced age, and decreased performance status, she will not be a candidate for very intensive chemotherapy. If Dr. Barry Dienes offers her upfront Whipple surgery, I would recommend adjuvant gemcitabine alone (although adjuvant gemcitabine and Xeloda, or FOLFIRINOX providers better survival, but she is unlikely a candidate for these treatment). If up front surgery is not offered, I'll consider neoadjuvant gemcitabine, and possible add on Abraxane if she tolerates well.  -We also discussed the role of radiation in neoadjuvant and adjuvant setting. -I'll present her case in our GI tumor Board tomorrow. -After lengthy discussion, patient wants her children to make a decision for her. She'll meet Dr. Barry Dienes, and make a treatment decision afterwards. - I strongly encouraged her to eat more to avoid losing more weight and tolerate treatment better. I will refer to dietician   2. Obstructive jaundice, secondary to the pancreatic cancer -Status post CBD metal stent placement by Dr. Benson Norway -will repeat her lab today   2. HTN  -Follow up with primary care physician, continue medication  3. Anorexia, weight loss  -I'll refer her to dietitian -I encouraged  her to take nutrition supplement  PLAN - lab and tumor marker today - CT chest within next week - refer to dietician - leave appointment open until decision is made after her surgical consult with Dr. Barry Dienes   Orders Placed This Encounter  Procedures  . CT Chest Wo Contrast    Standing Status:   Future    Standing Expiration Date:   10/14/2017    Order  Specific Question:   Reason for Exam (SYMPTOM  OR DIAGNOSIS REQUIRED)    Answer:   staging    Order Specific Question:   Preferred imaging location?    Answer:   [facility]    Order Specific Question:   Radiology Contrast Protocol - do NOT remove file path    Answer:   [email or web address]  . CBC with Differential    Standing Status:   Standing    Number of Occurrences:   50    Standing Expiration Date:   10/14/2021  . Comprehensive metabolic panel    Standing Status:   Standing    Number of Occurrences:   50    Standing Expiration Date:   10/14/2021  . CA 19.9    Standing Status:   Standing    Number of Occurrences:   50    Standing Expiration Date:   10/14/2021    All questions were answered. The patient knows to call the clinic with any problems, questions or concerns. I spent 55 minutes counseling the patient face to face. The total time spent in the appointment was 60 minutes and more than 50% was on counseling.     Truitt Merle, MD 10/14/2016

## 2016-10-14 ENCOUNTER — Encounter: Payer: Self-pay | Admitting: Hematology

## 2016-10-14 ENCOUNTER — Ambulatory Visit (HOSPITAL_BASED_OUTPATIENT_CLINIC_OR_DEPARTMENT_OTHER): Payer: Medicare Other

## 2016-10-14 ENCOUNTER — Ambulatory Visit (HOSPITAL_BASED_OUTPATIENT_CLINIC_OR_DEPARTMENT_OTHER): Payer: Medicare Other | Admitting: Hematology

## 2016-10-14 ENCOUNTER — Telehealth: Payer: Self-pay | Admitting: Hematology

## 2016-10-14 DIAGNOSIS — R17 Unspecified jaundice: Secondary | ICD-10-CM

## 2016-10-14 DIAGNOSIS — R634 Abnormal weight loss: Secondary | ICD-10-CM | POA: Diagnosis not present

## 2016-10-14 DIAGNOSIS — R630 Anorexia: Secondary | ICD-10-CM

## 2016-10-14 DIAGNOSIS — C250 Malignant neoplasm of head of pancreas: Secondary | ICD-10-CM

## 2016-10-14 DIAGNOSIS — I10 Essential (primary) hypertension: Secondary | ICD-10-CM

## 2016-10-14 DIAGNOSIS — C259 Malignant neoplasm of pancreas, unspecified: Secondary | ICD-10-CM | POA: Insufficient documentation

## 2016-10-14 LAB — CBC WITH DIFFERENTIAL/PLATELET
BASO%: 0.6 % (ref 0.0–2.0)
BASOS ABS: 0 10*3/uL (ref 0.0–0.1)
EOS ABS: 0.1 10*3/uL (ref 0.0–0.5)
EOS%: 2.8 % (ref 0.0–7.0)
HEMATOCRIT: 34.3 % — AB (ref 34.8–46.6)
HEMOGLOBIN: 11.2 g/dL — AB (ref 11.6–15.9)
LYMPH#: 1.9 10*3/uL (ref 0.9–3.3)
LYMPH%: 37.3 % (ref 14.0–49.7)
MCH: 29.3 pg (ref 25.1–34.0)
MCHC: 32.7 g/dL (ref 31.5–36.0)
MCV: 89.8 fL (ref 79.5–101.0)
MONO#: 0.6 10*3/uL (ref 0.1–0.9)
MONO%: 12.6 % (ref 0.0–14.0)
NEUT#: 2.3 10*3/uL (ref 1.5–6.5)
NEUT%: 46.7 % (ref 38.4–76.8)
Platelets: 280 10*3/uL (ref 145–400)
RBC: 3.82 10*6/uL (ref 3.70–5.45)
RDW: 14.4 % (ref 11.2–14.5)
WBC: 5 10*3/uL (ref 3.9–10.3)

## 2016-10-14 LAB — COMPREHENSIVE METABOLIC PANEL
ALBUMIN: 3.2 g/dL — AB (ref 3.5–5.0)
ALK PHOS: 314 U/L — AB (ref 40–150)
ALT: 117 U/L — ABNORMAL HIGH (ref 0–55)
AST: 81 U/L — ABNORMAL HIGH (ref 5–34)
Anion Gap: 9 mEq/L (ref 3–11)
BUN: 11.1 mg/dL (ref 7.0–26.0)
CALCIUM: 9.6 mg/dL (ref 8.4–10.4)
CO2: 24 mEq/L (ref 22–29)
Chloride: 107 mEq/L (ref 98–109)
Creatinine: 0.8 mg/dL (ref 0.6–1.1)
EGFR: 81 mL/min/{1.73_m2} — AB (ref >90)
Glucose: 180 mg/dl — ABNORMAL HIGH (ref 70–140)
POTASSIUM: 3.8 meq/L (ref 3.5–5.1)
Sodium: 140 mEq/L (ref 136–145)
Total Bilirubin: 6.11 mg/dL (ref 0.20–1.20)
Total Protein: 7.4 g/dL (ref 6.4–8.3)

## 2016-10-14 NOTE — Telephone Encounter (Signed)
Scheduled appt pe r7/10 los - Gave patient AVS and calender per [facility] to contact patient with ct schedule.

## 2016-10-15 ENCOUNTER — Encounter: Payer: Medicare Other | Admitting: Nutrition

## 2016-10-15 LAB — CANCER ANTIGEN 19-9: CA 19-9: 693 U/mL — ABNORMAL HIGH (ref 0–35)

## 2016-10-18 ENCOUNTER — Encounter: Payer: Self-pay | Admitting: Hematology

## 2016-10-21 ENCOUNTER — Telehealth: Payer: Self-pay | Admitting: Hematology and Oncology

## 2016-10-21 ENCOUNTER — Ambulatory Visit (HOSPITAL_COMMUNITY)
Admission: RE | Admit: 2016-10-21 | Discharge: 2016-10-21 | Disposition: A | Discharge status: Home / Self Care | Payer: Medicare Other | Source: Ambulatory Visit | Attending: Hematology | Admitting: Hematology

## 2016-10-21 ENCOUNTER — Telehealth: Payer: Self-pay | Admitting: Medical Oncology

## 2016-10-21 DIAGNOSIS — C250 Malignant neoplasm of head of pancreas: Secondary | ICD-10-CM | POA: Insufficient documentation

## 2016-10-21 DIAGNOSIS — Z9689 Presence of other specified functional implants: Secondary | ICD-10-CM | POA: Insufficient documentation

## 2016-10-21 DIAGNOSIS — I700 Atherosclerosis of aorta: Secondary | ICD-10-CM | POA: Insufficient documentation

## 2016-10-21 DIAGNOSIS — R918 Other nonspecific abnormal finding of lung field: Secondary | ICD-10-CM | POA: Insufficient documentation

## 2016-10-21 DIAGNOSIS — I77810 Thoracic aortic ectasia: Secondary | ICD-10-CM | POA: Insufficient documentation

## 2016-10-21 DIAGNOSIS — I2510 Atherosclerotic heart disease of native coronary artery without angina pectoris: Secondary | ICD-10-CM | POA: Diagnosis not present

## 2016-10-21 NOTE — Telephone Encounter (Signed)
Scheduled appt per 7/17 los - Gave patient AVS and calender per [facility] to contact patient with imaging schedules.

## 2016-10-21 NOTE — Telephone Encounter (Addendum)
Daughter notified. Schedule request sent for 2 days after Dr Barry Dienes appt which is 7/24.

## 2016-10-21 NOTE — Telephone Encounter (Signed)
  Please let pt or her daughter know the CT result, no metstatic disease. If she knows her date of Dr. Marlowe Aschoff appointment, then schedule her f/u with me a few days after, thanks   Truitt Merle MD

## 2016-10-21 NOTE — Telephone Encounter (Signed)
Called report-CT abd  2. Mild patchy consolidation in the basilar left lower lobe and patchy ground-glass attenuation at both lung bases, mildly increased since 09/24/2016 CT abdomen study, most suggestive of a mild nonspecific infectious or inflammatory bronchopneumonia. Recommend attention on follow-up post treatment chest CT in 3 months

## 2016-10-22 ENCOUNTER — Telehealth: Payer: Self-pay | Admitting: Hematology

## 2016-10-22 NOTE — Telephone Encounter (Signed)
Scheduled appt  Per sch message - 7/17 patient is aware of appt date and time.

## 2016-10-28 ENCOUNTER — Other Ambulatory Visit: Payer: Self-pay | Admitting: General Surgery

## 2016-10-28 DIAGNOSIS — C250 Malignant neoplasm of head of pancreas: Secondary | ICD-10-CM | POA: Diagnosis not present

## 2016-10-28 DIAGNOSIS — E440 Moderate protein-calorie malnutrition: Secondary | ICD-10-CM | POA: Diagnosis not present

## 2016-10-28 NOTE — H&P (Signed)
Rhonda Spencer 10/28/2016 11:32 AM Location: [facility] Patient #: [id number] DOB: Mar 28, 1941 Single / Language: Rhonda Spencer / Race: Black or African American Female  History of Present Illness Rhonda Klein MD; 10/28/2016 1:06 PM) The patient is a 76 year old female who presents with pancreatic cancer. Patient is a 76 year old female referred by Dr. 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 She presented with weight loss, fatigue, diarrhea, and jaundice to Dr. Collene Spencer. She had lost around 20 pounds and LFTs demonstrated a pattern of obstructive jaundice. She underwent imaging which showed a pancreatic head mass and a double duct sign. Dr. Benson Spencer performed an ERCP and endoscopic ultrasound which confirmed these findings. Cytology was positive for malignancy. She is feeling dramatically better since her stenting. She is no longer having dark urine. She is not having any more diarrhea. Her appetite has continued to improve. She does have some back pain. She uses a cane to ambulate, but goes to the [employer] 2-3 times a week to exercise. She plays cards at the senior center multiple times a week as well. She is taking Ensure several times a week, but not every day. Her appetite is picking up dramatically.  Diagnosis FINE NEEDLE ASPIRATION, ENDOSCOPIC HEAD OF PANCREAS (SPECIMEN 1 OF 1, COLLECTED 10/03/16): MALIGNANT CELLS PRESENT, CONSISTENT WITH ADENOCARCINOMA.  EUS 10/03/16 A round mass was identified in the pancreatic head. The mass was hypoechoic. The mass measured 24 mm by 19 mm in maximal cross-sectional diameter. The endosonographic borders were poorly-defined. An intact interface was seen between the mass and the celiac trunk, portal vein and duodenum suggesting a lack of invasion. The remainder of the pancreas was examined. The endosonographic appearance of parenchyma and the upstream pancreatic duct indicated duct dilation, a maximum duct diameter of 7  mm and parenchymal atrophy. Fine needle aspiration for cytology was performed. Color Doppler imaging was utilized prior to needle puncture to confirm a lack of significant vascular structures within the needle path. Five passes were made with the 25 gauge needle using a transduodenal approach. A stylet was used. A cytotechnologist was present to evaluate the adequacy of the specimen. The cellularity of the specimen was adequate. Final cytology results are pending. It is likely that the malignancy is larger than measured. The lesion was indistinct, but with the FNA there was a course sensation with passage of the needle. Endoscopically there appears to be some minor extrinsic compression into the duodenal lumen. The CBD was markedly dilated and measured to be 17-18 mm at its greatest diameter.  ERCP 10/03/2016 The major papilla was normal. The bile duct was easily and deeply cannulated with the short-nosed traction sphincterotome with the initial attempt. Contrast was injected. I personally interpreted the bile duct images. There was brisk flow of contrast through the ducts. Image quality was excellent. Contrast extended to the bifurcation. The common bile duct was diffusely dilated, with a mass causing an obstruction. The largest diameter was 18 mm. The lower third of the main bile duct contained a single localized stenosis 20 mm in length. A short 0.035 inch Soft Jagwire was passed into the biliary tree. A 10 mm biliary sphincterotomy was made with a monofilament traction (standard) sphincterotome using ERBE electrocautery. The sphincterotomy oozed blood. One 10 mm by 4 cm covered metal stent was placed 2 cm into the common bile duct. Clear fluid flowed through the stent. The stent was in good position.  Ct Chest Wo Contrast  Result Date:  10/21/2016 CLINICAL DATA: New diagnosis of pancreatic head malignancy. Chest staging. Remote smoking history. EXAM: CT CHEST WITHOUT CONTRAST TECHNIQUE:  Multidetector CT imaging of the chest was performed following the standard protocol without IV contrast. COMPARISON: 09/24/2016 CT abdomen/ pelvis. 06/06/2011 chest CT. FINDINGS: Mildly motion degraded scan. Cardiovascular: Normal heart size. No significant pericardial fluid/thickening. Left anterior descending coronary atherosclerosis. Atherosclerotic thoracic aorta with ectatic 4.2 cm ascending thoracic aorta, stable. 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 Normal caliber pulmonary arteries . Mediastinum/Nodes: Stable subcentimeter hyperdense left thyroid lobe nodule. Unremarkable esophagus. No pathologically enlarged axillary, mediastinal or gross hilar lymph nodes, noting limited sensitivity for the detection of hilar adenopathy on this noncontrast study. Lungs/Pleura: No pneumothorax. No pleural effusion. Stable scattered tiny calcified granulomas in the right lung. Peripheral apical subpleural right upper lobe 3 mm nodule (series 7/ image 12) is stable since 06/06/2011 and considered benign. No new significant pulmonary nodules. No lung masses. Mild patchy consolidation in the peribronchovascular and peripheral basilar left lower lobe appears slightly increased since 09/24/2016 CT abdomen study. Patchy ground-glass attenuation in both lower lobes also appears mildly increased. Upper abdomen: Cholecystectomy. Partially visualized common bile duct stent. Pneumobilia throughout the intrahepatic bile ducts and visualized common bile duct, compatible with recent ERCP and stent placement. Two large simple liver cysts, largest 6.8 cm in the superior liver. Stable diffuse main pancreatic duct dilation. Musculoskeletal: No aggressive appearing focal osseous lesions. Marked thoracic spondylosis. IMPRESSION: 1. No specific findings of metastatic disease in the chest. 2.  Mild patchy consolidation in the basilar left lower lobe and patchy ground-glass attenuation at both lung bases, mildly increased since 09/24/2016 CT abdomen study, most suggestive of a mild nonspecific infectious or inflammatory bronchopneumonia. Recommend attention on follow-up post treatment chest CT in 3 months. 3. Pneumobilia and common bile duct stent placement in the visualized upper abdomen. 4. Stable ectatic 4.2 cm ascending thoracic aorta. Recommend annual imaging followup by CTA or MRA. This recommendation follows 2010 ACCF/AHA/AATS/ACR/ASA/SCA/SCAI/SIR/STS/SVM Guidelines for the Diagnosis and Management of Patients with Thoracic Aortic Disease. Circulation. 2010; 121: N397-Q734. 5. Aberrant right subclavian artery. 6. One vessel coronary atherosclerosis. Aortic Atherosclerosis (ICD10-I70.0). Aortic aneurysm NOS (ICD10-I71.9). These results will be called to the ordering clinician or representative by the Radiologist Assistant, and communication documented in the PACS or zVision Dashboard. Electronically Signed By: Ilona Sorrel M.D. On: 10/21/2016 14:46  Dg Ercp Biliary & Pancreatic Ducts  Result Date: 10/03/2016 CLINICAL DATA: ERCP with stent placement EXAM: ERCP TECHNIQUE: Multiple spot images obtained with the fluoroscopic device and submitted for interpretation post-procedure. FLUOROSCOPY TIME: 1 minute, 42 seconds (9.98 mGy) COMPARISON: CT abdomen and pelvis -09/24/2016 FINDINGS: Two spot intraoperative fluoroscopic images of the right upper abdominal quadrant during ERCP are provided for review Initial image demonstrates an ERCP probe overlying the right upper abdominal quadrant. Post cholecystectomy. There is selective cannulation and opacification of the distal aspect of the common bile duct. The common bile duct appears markedly dilated with abrupt tapering distally, similar to preceding abdominal CT. There is no definitive opacification of the intrahepatic biliary system, residual  cystic duct or the pancreatic duct. Completion image demonstrates placement of an internal biliary stent overlying the distal aspect of the CBD with pneumobilia within the central common bile duct. IMPRESSION: ERCP with biliary stent placement as above. These images were submitted for radiologic interpretation only. Please see the procedural report  for the amount of contrast and the fluoroscopy time utilized. Electronically Signed By: Sandi Mariscal M.D. On: 10/03/2016 10:08   Recent Results (from the past 2160 hour(s)) CBC with Differential Status: Abnormal Collection Time: 10/14/16 9:05 AM Result Value Ref Range WBC 5.0 3.9 - 10.3 10e3/uL NEUT# 2.3 1.5 - 6.5 10e3/uL HGB 11.2 (L) 11.6 - 15.9 g/dL HCT 34.3 (L) 34.8 - 46.6 % Platelets 280 145 - 400 10e3/uL MCV 89.8 79.5 - 101.0 fL MCH 29.3 25.1 - 34.0 pg MCHC 32.7 31.5 - 36.0 g/dL RBC 3.82 3.70 - 5.45 10e6/uL RDW 14.4 11.2 - 14.5 % lymph# 1.9 0.9 - 3.3 10e3/uL MONO# 0.6 0.1 - 0.9 10e3/uL Eosinophils Absolute 0.1 0.0 - 0.5 10e3/uL Basophils Absolute 0.0 0.0 - 0.1 10e3/uL NEUT% 46.7 38.4 - 76.8 % LYMPH% 37.3 14.0 - 49.7 % MONO% 12.6 0.0 - 14.0 % EOS% 2.8 0.0 - 7.0 % BASO% 0.6 0.0 - 2.0 % Comprehensive metabolic panel Status: Abnormal Collection Time: 10/14/16 9:05 AM Result Value Ref Range Sodium 140 136 - 145 mEq/L Potassium 3.8 3.5 - 5.1 mEq/L Chloride 107 98 - 109 mEq/L CO2 24 22 - 29 mEq/L Glucose 180 (H) 70 - 140 mg/dl Comment: Glucose reference range is for nonfasting patients. Fasting glucose reference range is 70- 100. BUN 11.1 7.0 - 26.0 mg/dL Creatinine 0.8 0.6 - 1.1 mg/dL Total Bilirubin 6.11 (HH) 0.20 - 1.20 mg/dL Alkaline Phosphatase 314 (H) 40 - 150 U/L AST 81 (H) 5 - 34 U/L ALT 117 (H) 0 - 55 U/L Total Protein 7.4 6.4 - 8.3 g/dL Albumin 3.2 (L) 3.5 - 5.0 g/dL Calcium 9.6 8.4 - 10.4 mg/dL Anion Gap 9 3 - 11 mEq/L EGFR 81 (L) >90 ml/min/1.73 m2 Comment: eGFR is calculated using the CKD-EPI Creatinine  Equation (2009) CA 19.9 Status: Abnormal Collection Time: 10/14/16 9:05 AM Result Value Ref Range CA 19-9 693 (H) 0 - 35 U/mL Comment: Roche ECLIA methodology         Past Surgical History Malachy Moan, RMA; 10/28/2016 11:33 AM) Cataract Surgery Bilateral. Hemorrhoidectomy Hysterectomy (not due to cancer) - Complete Oral Surgery Spinal Surgery - Lower Back Tonsillectomy  Diagnostic Studies History Malachy Moan, RMA; 10/28/2016 11:33 AM) Colonoscopy 1-5 years ago Mammogram within last year Pap Smear >5 years ago  Allergies Malachy Moan, RMA; 10/28/2016 11:34 AM) Atorvastatin Calcium *ANTIHYPERLIPIDEMICS* Iodinated Diagnostic Agents Trandolapril-Verapamil HCl ER *ANTIHYPERTENSIVES*  Medication History Malachy Moan, RMA; 10/28/2016 11:35 AM) Lidocaine (5% Patch, External) Active. Felodipine ER (5MG Tablet ER 24HR, Oral) Active. Betamethasone Dipropionate (0.05% Cream, External) Active. Aspirin (81MG Tablet, Oral) Active. Famotidine (20MG Tablet, Oral) Active. Ketotifen Fumarate (0.025% Solution, Ophthalmic) Active. Magnesium (250MG Tablet, Oral) Active. Medications Reconciled  Social History Malachy Moan, [place]; 10/28/2016 11:33 AM) Alcohol use Moderate alcohol use. Caffeine use Carbonated beverages, Coffee, Tea. No drug use Tobacco use Former smoker.  Family History Malachy Moan, [place]; 10/28/2016 11:33 AM) Diabetes Mellitus Daughter, Sister, Son. Heart Disease Mother. Heart disease in female family member before age 11 Hypertension Mother.  Pregnancy / Birth History Malachy Moan, [place]; 10/28/2016 11:33 AM) Age at menarche 63 years. Age of menopause <43 Gravida 3 Length (months) of breastfeeding 3-6 Maternal age 78-30 Para 2  Other Problems Malachy Moan, [place]; 10/28/2016 11:33 AM) Arthritis Cholelithiasis Gastroesophageal Reflux Disease High blood pressure Oophorectomy  Bilateral. Pancreatic Cancer     Review of Systems Malachy Moan RMA; 10/28/2016 11:33 AM) General Present- Appetite Loss, Fatigue and Weight Loss. Not Present- Chills, Fever, Night Sweats and Weight Gain. HEENT Present-  Yellow Eyes. Not Present- Earache, Hearing Loss, Hoarseness, Nose Bleed, Oral Ulcers, Ringing in the Ears, Seasonal Allergies, Sinus Pain, Sore Throat, Visual Disturbances and Wears glasses/contact lenses. Respiratory Not Present- Bloody sputum, Chronic Cough, Difficulty Breathing, Snoring and Wheezing. Breast Not Present- Breast Mass, Breast Pain, Nipple Discharge and Skin Changes. Gastrointestinal Present- Change in Bowel Habits and Gets full quickly at meals. Not Present- Abdominal Pain, Bloating, Bloody Stool, Chronic diarrhea, Constipation, Difficulty Swallowing, Excessive gas, Hemorrhoids, Indigestion, Nausea, Rectal Pain and Vomiting. Musculoskeletal Present- Back Pain. Not Present- Joint Pain, Joint Stiffness, Muscle Pain, Muscle Weakness and Swelling of Extremities.  Vitals Malachy Moan RMA; 10/28/2016 11:36 AM) 10/28/2016 11:35 AM Weight: 130.6 lb Height: 66in Body Surface Area: 1.67 m Body Mass Index: 21.08 kg/m  Temp.: 97.38F  Pulse: 62 (Regular)  BP: 136/70 (Sitting, Left Arm, Standard)      Physical Exam Rhonda Klein MD; 10/28/2016 1:06 PM)  General Mental Status-Alert. General Appearance-Consistent with stated age. Hydration-Well hydrated. Voice-Normal.  Head and Neck Head-normocephalic, atraumatic with no lesions or palpable masses. Trachea-midline. Thyroid Gland Characteristics - normal size and consistency.  Eye Eyeball - Bilateral-Extraocular movements intact. Sclera/Conjunctiva - Bilateral-No scleral icterus.  Chest and Lung Exam Chest and lung exam reveals -quiet, even and easy respiratory effort with no use of accessory muscles and on auscultation, normal breath sounds, no adventitious sounds and  normal vocal resonance. Inspection Chest Wall - Normal. Back - normal.  Cardiovascular Cardiovascular examination reveals -normal heart sounds, regular rate and rhythm with no murmurs and normal pedal pulses bilaterally.  Abdomen Inspection Inspection of the abdomen reveals - No Hernias. Palpation/Percussion Palpation and Percussion of the abdomen reveal - Soft, Non Tender, No Rebound tenderness, No Rigidity (guarding) and No hepatosplenomegaly. Auscultation Auscultation of the abdomen reveals - Bowel sounds normal. Note: midline incision and RUQ incision.  Neurologic Neurologic evaluation reveals -alert and oriented x 3 with no impairment of recent or remote memory. Mental Status-Normal.  Musculoskeletal Global Assessment -Note:no gross deformities.  Normal Exam - Left-Upper Extremity Strength Normal and Lower Extremity Strength Normal. Normal Exam - Right-Upper Extremity Strength Normal and Lower Extremity Strength Normal.  Lymphatic Head & Neck  General Head & Neck Lymphatics: Bilateral - Description - Normal. Axillary  General Axillary Region: Bilateral - Description - Normal. Tenderness - Non Tender. Femoral & Inguinal  Generalized Femoral & Inguinal Lymphatics: Bilateral - Description - No Generalized lymphadenopathy.    Assessment & Plan Rhonda Klein MD; 10/28/2016 1:12 PM)  ADENOCARCINOMA OF HEAD OF PANCREAS (C25.0) Impression: Patient has a new diagnosis of adenocarcinoma of the pancreatic head. This is a T2 N0 clinical stage. Staging workup is negative for metastatic disease. She does have a elevated CA 19-9, but this was drawn prior to resolution of her jaundice. She has discussed the pros and cons of neoadjuvant chemotherapy with Dr. Burr Medico. She would like to pursue the most effective treatment that she is a candidate for. I discussed with her, her daughter, and her son the risks of surgery including the likelihood of prolonged recovery. She is  moving in with her daughter. I discussed the surgery with the patient including diagrams of anatomy. I discussed the potential for diagnostic laparoscopy. In the case of pancreatic cancer, if spread of the disease is found, we will abort the procedure and not proceed with resection. The rationale for this was discussed with the patient. There has not been data to support resection of Stage IV disease in terms of survival benefit.  We discussed possible complications  including: Potential of aborting procedure if tumor is invading the superior mesenteric or hepatic arteries Bleeding Infection and possible wound complications such as hernia Damage to adjacent structures Leak of anastamoses, primarily pancreatic Possible need for other procedures Possible prolonged nausea with possible need for external feeding. Possible prolonged hospital stay. Possible development of diabetes or worsening of current diabetes. Possible pancreatic exocrine insufficiency Prolonged fatigue/weakness/appetite Possible early recurrence of cancer   The patient understands and wishes to proceed. The patient has been advised to turn in disability paperwork to our office.  Current Plans Pt Education - [facility] pt info Referred to Physical Therapy, for evaluation and follow up (Physical Therapy). Routine. 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 I advised her to reschedule her nutrition appointment. I am going to send her to preoperative physical therapy to maximize her strength prior to surgery.

## 2016-10-28 NOTE — Progress Notes (Signed)
[place]  [phone] [phone]  Clinic Follow-up Note   Patient Care Team: Iona Beard, MD as PCP - General (Family Medicine) Carol Ada, MD as Consulting Physician (Gastroenterology) Truitt Merle, MD as Consulting Physician (Hematology) [name], MD as Consulting Physician (General Surgery) 10/30/2016   CHIEF COMPLAINTS:  F/u pancreatic cancer  Oncology History   Cancer Staging Pancreatic cancer [facility]) Staging form: Exocrine Pancreas, AJCC 8th Edition - Clinical stage from 10/03/2016: Stage IB (cT2, cN0, cM0) - Signed by Truitt Merle, MD on 10/18/2016       Pancreatic cancer ([place])   09/26/2016 Imaging    CT Abdomen Pelvis w contrast  IMPRESSION: 1. Marked prominence of intrahepatic biliary ducts, the common bile duct and pancreatic duct. This may simply reflect prior cholecystectomy, though would correlate clinically for any evidence of postcholecystectomy syndrome. 2. Large hepatic cysts again noted, measuring up to 6.6 cm in size. 3. Tiny left renal cyst noted. 4. Mild degenerative change at the lower lumbar spine.      09/26/2016 Procedure    EUS  - Normal esophagus. - Normal stomach. - Normal examined duodenum. - There was dilation in the common bile duct and in the intrahepatic bile duct(s) which measured up to 22 mm. - A cyst was found in the left lobe of the liver and measured 33 mm by 32 mm. - The pancreatic duct had a dilated endosonographic appearance in the main pancreatic duct. The pancreatic duct measured up to 17 mm in diameter. - No specimens collected      09/26/2016 Initial Diagnosis    Pancreatic cancer ([place])      10/03/2016 Procedure    ERCP  The major papilla appeared normal. - A localized biliary stricture was found. The stricture was malignant appearing secondary to the extrinsic compression. - The common bile duct was dilated, with a mass causing an obstruction. - A biliary sphincterotomy was  performed. - One covered metal stent was placed into the common bile duct.       10/03/2016 Procedure    Repeated EUS showed a 2.4cm mass in the pancreatic head. Fine needle aspiration performed. - performed by Dr.Hung       10/03/2016 Pathology Results    Diagnosis  FINE NEEDLE ASPIRATION, ENDOSCOPIC HEAD OF PANCREAS (SPECIMEN 1 OF 1, COLLECTED 10/03/16): MALIGNANT CELLS PRESENT, CONSISTENT WITH ADENOCARCINOMA.      10/21/2016 Imaging    CT Chest WO Contrast IMPRESSION: 1. No specific findings of metastatic disease in the chest. 2. Mild patchy consolidation in the basilar left lower lobe and patchy ground-glass attenuation at both lung bases, mildly increased since 09/24/2016 CT abdomen study, most suggestive of a mild nonspecific infectious or inflammatory bronchopneumonia. Recommend attention on follow-up post treatment chest CT in 3 months. 3. Pneumobilia and common bile duct stent placement in the visualized upper abdomen. 4. Stable ectatic 4.2 cm ascending thoracic aorta. Recommend annual imaging followup by CTA or MRA. This recommendation follows 2010 ACCF/AHA/AATS/ACR/ASA/SCA/SCAI/SIR/STS/SVM Guidelines for the Diagnosis and Management of Patients with Thoracic Aortic Disease. Circulation. 2010; 121: F638-G665. 5. Aberrant right subclavian artery. 6. One vessel coronary atherosclerosis.       HISTORY OF PRESENTING ILLNESS: 10/14/16 Rhonda Spencer 76 y.o. female is here because of her newly diagnosed pancreatic cancer. She was referred by her gastroenterologist Dr. Benson Norway.  She presented with low appetite, fatigue, diarrhea and 20 pounds weight loss since March. She was seen by her primary care physician and lab test showed abnormal liver  functions. She was admitted to the hospital on 09/26/2016 due to recently identified abnormal liver enzymes that were in an obstructive pattern. CT of abdomen and pelvis showed up for a mildly hypo-attenuating 2.4 x 1.7 x 1.8 cm mass at the  pancreatic head, with markedly dilated pancreatic duct and biliary duct dilatation. The She was seen by gastroenterology Dr. Benson Norway and underwent EUS on 10/03/2016 which showed a 2.4 x 1.9 cm mass in the head of pancreas, no ultrasound evidence of invading blood vessels or duodenum. Biopsy showed adenocarcinoma. She underwent ERCP and a covered metal stent was placed in CBD.   Today she presents to the clinic accompanied by her son and daughter. She noticed jaundice in her eyes and dark urine before the procedures. Her loss of appetite has caused her to lose 20 pounds since March. She denies any chest pain, cough, or shortness of breath. Her last bowel movement was 1 week ago. Her energy level is lower than normal. She has back pain that can become unbearable at time and she uses Lidocaine patch to help. She denies any other pains.  She wants to leave the treatment decision up to her children.   CURRENT THERAPY: Pending Whipple surgery    INTERVAL HISTORY:  Rhonda Spencer is here for a follow up. She presents to the clinic today with her family. She feels stronger. She drinks ensure. And she had a birthday yesterday. Dr. Barry Dienes emphasized she needs to get stronger.  Her family asked questions about her surgery and how much she should be before surgery.    MEDICAL HISTORY:  Past Medical History:  Diagnosis Date  . AI (aortic insufficiency)   . History of nuclear stress test 11/2008   bruce myoview; normal pattern of perfusion in allregions, no significant wall motion abnormalities; no ECG changes, no significant ischemia demonstrated, low risk scan   . Hypertension   . MR (mitral regurgitation)   . Rheumatic fever   . Rheumatic heart disease   . Rheumatoid arthritis ([place])   . Sinus bradycardia     SURGICAL HISTORY: Past Surgical History:  Procedure Laterality Date  . ABDOMINAL HYSTERECTOMY    . CHOLECYSTECTOMY    . COLON SURGERY    . ERCP N/A 10/03/2016   Procedure: ENDOSCOPIC  RETROGRADE CHOLANGIOPANCREATOGRAPHY (ERCP);  Surgeon: Carol Ada, MD;  Location: [facility];  Service: Endoscopy;  Laterality: N/A;  . EXCISIONAL HEMORRHOIDECTOMY    . HEMI-MICRODISCECTOMY LUMBAR LAMINECTOMY LEVEL 4  1995  . TRANSTHORACIC ECHOCARDIOGRAM  11/2011   EF=>55%; LA mild-mod dilated; mod-severe MR; mild-mod TR with normal systolic pressure; mild-mod AV regurg; mild pulm valve regurg   . UPPER ESOPHAGEAL ENDOSCOPIC ULTRASOUND (EUS) N/A 09/26/2016   Procedure: UPPER ESOPHAGEAL ENDOSCOPIC ULTRASOUND (EUS);  Surgeon: Carol Ada, MD;  Location: [facility];  Service: Endoscopy;  Laterality: N/A;  . UPPER ESOPHAGEAL ENDOSCOPIC ULTRASOUND (EUS) N/A 10/03/2016   Procedure: UPPER ESOPHAGEAL ENDOSCOPIC ULTRASOUND (EUS);  Surgeon: Carol Ada, MD;  Location: [facility];  Service: Endoscopy;  Laterality: N/A;    SOCIAL HISTORY: Social History   Social History  . Marital status: Single    Spouse name: N/A  . Number of children: 2  . Years of education: 14   Occupational History  . Retired    Social History Main Topics  . Smoking status: Former Smoker    Years: 8.00    Quit date: 04/08/1967  . Smokeless tobacco: Never Used  . Alcohol use No  . Drug use: No  .  Sexual activity: Not on file   Other Topics Concern  . Not on file   Social History Narrative   She lives in an apartment in [place]. She has children, grandchildren and great grandchildren in the area.    FAMILY HISTORY: Family History  Problem Relation Age of Onset  . Hypertension Mother   . Stroke Mother   . Heart disease Mother   . Pneumonia Father   . Bone cancer Maternal Uncle   . Diabetes Sister        x2; one was step-sister  . Tuberculosis Sister        step-sister  . Diabetes Son   . Diabetes Daughter     ALLERGIES:  is allergic to tarka [trandolapril-verapamil hcl er]; atorvastatin; and contrast media [iodinated diagnostic agents].  MEDICATIONS:  Current Outpatient Prescriptions    Medication Sig Dispense Refill  . aspirin 81 MG tablet Take 81 mg by mouth daily.    . betamethasone dipropionate (DIPROLENE) 0.05 % cream Apply 1 application topically daily as needed (itching).    . famotidine (PEPCID) 20 MG tablet Take 20 mg by mouth daily. May take an additional 20mg s as needed for heartburn    . felodipine (PLENDIL) 5 MG 24 hr tablet Take 5 mg by mouth daily.    [name] ketotifen (ZADITOR) 0.025 % ophthalmic solution Place 1 drop into the left eye daily as needed (irritation).    [name] lidocaine (LIDODERM) 5 % Place 1 patch onto the skin daily as needed (back pain). Remove & Discard patch within 12 hours or as directed by MD     . Magnesium 250 MG TABS Take 250 mg by mouth daily.    [name] OVER THE COUNTER MEDICATION Take 1 capsule by mouth 2 (two) times daily. IBGard    . OVER THE COUNTER MEDICATION Take 2 capsules by mouth 2 (two) times daily as needed (ibs). FDGard     No current facility-administered medications for this visit.     REVIEW OF SYSTEMS:   Constitutional: Denies fevers, chills or abnormal night sweats (+)loss 20 pounds since March Eyes: Denies blurriness of vision, double vision or watery eyes Ears, nose, mouth, throat, and face: Denies mucositis or sore throat Respiratory: Denies cough, dyspnea or wheezes Cardiovascular: Denies palpitation, chest discomfort or lower extremity swelling Gastrointestinal:  Denies nausea, heartburn or change in bowel habits Skin: Denies abnormal skin rashes Lymphatics: Denies new lymphadenopathy or easy bruising Neurological:Denies numbness, tingling or new weaknesses Behavioral/Psych: Mood is stable, no new changes  MSK: (+)back pain All other systems were reviewed with the patient and are negative.   PHYSICAL EXAMINATION:  ECOG PERFORMANCE STATUS: 2 - Symptomatic, <50% confined to bed  Vitals:   10/30/16 1515  BP: (!) 139/55  Pulse: 66  Resp: 18  Temp: 98 F (36.7 C)   Filed Weights   10/30/16 1515  Weight: 132 lb  11.2 oz (60.2 kg)    GENERAL:alert, no distress and comfortable SKIN: skin color, texture, turgor are normal, no rashes or significant lesions EYES: normal, conjunctiva are pink and non-injected,  (+)jaundice in eyes OROPHARYNX:no exudate, no erythema and lips, buccal mucosa, and tongue normal  NECK: supple, thyroid normal size, non-tender, without nodularity LYMPH:  no palpable lymphadenopathy in the cervical, axillary or inguinal LUNGS: clear to auscultation and percussion with normal breathing effort HEART: regular rate & rhythm and no lower extremity edema (+) slight murmur  ABDOMEN:abdomen soft, non-tender and normal bowel sounds  Musculoskeletal:no cyanosis of digits and no clubbing  PSYCH: alert & oriented x 3 with fluent speech NEURO: no focal motor/sensory deficits  LABORATORY DATA:  I have reviewed the data as listed CBC Latest Ref Rng & Units 10/14/2016 09/23/2015 09/22/2015  WBC 3.9 - 10.3 10e3/uL 5.0 3.9(L) 4.9  Hemoglobin 11.6 - 15.9 g/dL 11.2(L) 11.3(L) 11.9(L)  Hematocrit 34.8 - 46.6 % 34.3(L) 34.4(L) 36.3  Platelets 145 - 400 10e3/uL 280 251 262   CMP Latest Ref Rng & Units 10/14/2016 09/23/2015 09/22/2015  Glucose 70 - 140 mg/dl 180(H) 105(H) 93  BUN 7.0 - 26.0 mg/dL 11.1 22(H) 25(H)  Creatinine 0.6 - 1.1 mg/dL 0.8 0.87 0.84  Sodium 136 - 145 mEq/L 140 140 138  Potassium 3.5 - 5.1 mEq/L 3.8 4.1 3.6  Chloride 101 - 111 mmol/L - 109 107  CO2 22 - 29 mEq/L 24 26 24   Calcium 8.4 - 10.4 mg/dL 9.6 8.8(L) 8.9  Total Protein 6.4 - 8.3 g/dL 7.4 6.8 -  Total Bilirubin 0.20 - 1.20 mg/dL 6.11(HH) 0.4 -  Alkaline Phos 40 - 150 U/L 314(H) 64 -  AST 5 - 34 U/L 81(H) 16 -  ALT 0 - 55 U/L 117(H) 10(L) -    PATHOLOGY: Diagnosis 10/03/2016 FINE NEEDLE ASPIRATION, ENDOSCOPIC HEAD OF PANCREAS (SPECIMEN 1 OF 1, COLLECTED 10/03/16): MALIGNANT CELLS PRESENT, CONSISTENT WITH ADENOCARCINOMA. Preliminary Diagnosis Intraoperative Diagnosis: Adequate. ([facility])  RADIOGRAPHIC STUDIES: I have  personally reviewed the radiological images as listed and agreed with the findings in the report. Ct Chest Wo Contrast  Result Date: 10/21/2016 CLINICAL DATA:  New diagnosis of pancreatic head malignancy. Chest staging. Remote smoking history. EXAM: CT CHEST WITHOUT CONTRAST TECHNIQUE: Multidetector CT imaging of the chest was performed following the standard protocol without IV contrast. COMPARISON:  09/24/2016 CT abdomen/ pelvis.  06/06/2011 chest CT. FINDINGS: Mildly motion degraded scan. Cardiovascular: Normal heart size. No significant pericardial fluid/thickening. Left anterior descending coronary atherosclerosis. Atherosclerotic thoracic aorta with ectatic 4.2 cm ascending thoracic aorta, stable. 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 Normal caliber pulmonary arteries . Mediastinum/Nodes: Stable subcentimeter hyperdense left thyroid lobe nodule. Unremarkable esophagus. No pathologically enlarged axillary, mediastinal or gross hilar lymph nodes, noting limited sensitivity for the detection of hilar adenopathy on this noncontrast study. Lungs/Pleura: No pneumothorax. No pleural effusion. Stable scattered tiny calcified granulomas in the right lung. Peripheral apical subpleural right upper lobe 3 mm nodule (series 7/ image 12) is stable since 06/06/2011 and considered benign. No new significant pulmonary nodules. No lung masses. Mild patchy consolidation in the peribronchovascular and peripheral basilar left lower lobe appears slightly increased since 09/24/2016 CT abdomen study. Patchy ground-glass attenuation in both lower lobes also appears mildly increased. Upper abdomen: Cholecystectomy. Partially visualized common bile duct stent. Pneumobilia throughout the intrahepatic bile ducts and visualized common bile duct, compatible with recent ERCP and stent  placement. Two large simple liver cysts, largest 6.8 cm in the superior liver. Stable diffuse main pancreatic duct dilation. Musculoskeletal: No aggressive appearing focal osseous lesions. Marked thoracic spondylosis. IMPRESSION: 1. No specific findings of metastatic disease in the chest. 2. Mild patchy consolidation in the basilar left lower lobe and patchy ground-glass attenuation at both lung bases, mildly increased since 09/24/2016 CT abdomen study, most suggestive of a mild nonspecific infectious or inflammatory bronchopneumonia. Recommend attention on follow-up post treatment chest CT in 3 months. 3. Pneumobilia and common bile duct stent placement in the visualized upper  abdomen. 4. Stable ectatic 4.2 cm ascending thoracic aorta. Recommend annual imaging followup by CTA or MRA. This recommendation follows 2010 ACCF/AHA/AATS/ACR/ASA/SCA/SCAI/SIR/STS/SVM Guidelines for the Diagnosis and Management of Patients with Thoracic Aortic Disease. Circulation. 2010; 121: S854-O270. 5. Aberrant right subclavian artery. 6. One vessel coronary atherosclerosis. Aortic Atherosclerosis (ICD10-I70.0). Aortic aneurysm NOS (ICD10-I71.9). These results will be called to the ordering clinician or representative by the Radiologist Assistant, and communication documented in the PACS or zVision Dashboard. Electronically Signed   By: Ilona Sorrel M.D.   On: 10/21/2016 14:46   Dg Ercp Biliary & Pancreatic Ducts  Result Date: 10/03/2016 CLINICAL DATA:  ERCP with stent placement EXAM: ERCP TECHNIQUE: Multiple spot images obtained with the fluoroscopic device and submitted for interpretation post-procedure. FLUOROSCOPY TIME:  1 minute, 42 seconds (9.98 mGy) COMPARISON:  CT abdomen and pelvis -09/24/2016 FINDINGS: Two spot intraoperative fluoroscopic images of the right upper abdominal quadrant during ERCP are provided for review Initial image demonstrates an ERCP probe overlying the right upper abdominal quadrant. Post  cholecystectomy. There is selective cannulation and opacification of the distal aspect of the common bile duct. The common bile duct appears markedly dilated with abrupt tapering distally, similar to preceding abdominal CT. There is no definitive opacification of the intrahepatic biliary system, residual cystic duct or the pancreatic duct. Completion image demonstrates placement of an internal biliary stent overlying the distal aspect of the CBD with pneumobilia within the central common bile duct. IMPRESSION: ERCP with biliary stent placement as above. These images were submitted for radiologic interpretation only. Please see the procedural report for the amount of contrast and the fluoroscopy time utilized. Electronically Signed   By: Sandi Mariscal M.D.   On: 10/03/2016 10:08    ASSESSMENT & PLAN: 76 year old [place] American female, presented with fatigue, Low appetite, diarrhea and weight loss.  1.Pancreatic adenocarcinoma, in pancreatic head, cT2N0Mx -I discussed the scan and biopsy results with patient and her family in details. -Based on the CT and endoscopy ultrasound, the pancreatic head cancer has no clear evidence of vascular invasion, is technically resectable. --reviewed CT Chest form 10/22/15 that showed no evidence of thoracic metastasis, she does have mild inflammation at lung bases. I suggest she takes deep breaths and be active to help.  -We discussed the aggressive nature history of pancreatic cancer, and the high risk of recurrence after surgical resection.  -We discussed the only curable treatment is surgical resection for early-stage pancreatic cancer. Due to the location of the mass, she would need Whipple surgery, which has high risk of surgical, medications and long postoperative recovery. She has seen a surgeon Dr. Barry Dienes and a Whipple surgery was offered and scheduled. -I discussed the role of adjuvant chemotherapy. Due to her advanced age, and decreased performance status, she will  not be a candidate for very intensive chemotherapy. If Dr. Barry Dienes offers her upfront Whipple surgery, I would recommend adjuvant gemcitabine alone or in combination with Xeloda (although adjuvant FOLFIRINOX providers better survival, but she is unlikely a candidate for these treatment) as adjuvant therapy - I strongly encouraged her to eat more to avoid losing more weight and she will recover better from surgery.  surgery  -Will f/u 3-4 weeks after surgery. If she recovers well I will start her on adjuvant chemo   2. Obstructive jaundice, secondary to the pancreatic cancer -Status post CBD metal stent placement by Dr. Benson Norway -Jaundice has improved  2. HTN  -Follow up with primary care physician, continue medication  3. Anorexia, weight loss  -  I referred her to dietitian -I encouraged her to take nutrition supplement -They missed dietician but she will still see her -She drinks ensure boosts. I encourage her to take in more protein and calories.    PLAN -Lab, and f/u on 9/3 post surgery to discuss adjuvant chemo    No orders of the defined types were placed in this encounter.   All questions were answered. The patient knows to call the clinic with any problems, questions or concerns. I spent 20 minutes counseling the patient face to face. The total time spent in the appointment was 25 minutes and more than 50% was on counseling.  This document serves as a record of services personally performed by Truitt Merle, MD. It was created on her behalf by Joslyn Devon, a trained medical scribe. The creation of this record is based on the scribe's personal observations and the provider's statements to them. This document has been checked and approved by the attending provider.      Truitt Merle, MD 10/30/2016

## 2016-10-29 ENCOUNTER — Telehealth (HOSPITAL_COMMUNITY): Payer: Self-pay | Admitting: Physician Assistant

## 2016-10-30 ENCOUNTER — Encounter: Payer: Self-pay | Admitting: Hematology

## 2016-10-30 ENCOUNTER — Ambulatory Visit (HOSPITAL_BASED_OUTPATIENT_CLINIC_OR_DEPARTMENT_OTHER): Payer: Medicare Other | Admitting: Hematology

## 2016-10-30 VITALS — BP 139/55 | HR 66 | Temp 98.0°F | Resp 18 | Ht 65.0 in | Wt 132.7 lb

## 2016-10-30 DIAGNOSIS — R630 Anorexia: Secondary | ICD-10-CM

## 2016-10-30 DIAGNOSIS — R634 Abnormal weight loss: Secondary | ICD-10-CM | POA: Diagnosis not present

## 2016-10-30 DIAGNOSIS — I10 Essential (primary) hypertension: Secondary | ICD-10-CM | POA: Diagnosis not present

## 2016-10-30 DIAGNOSIS — C250 Malignant neoplasm of head of pancreas: Secondary | ICD-10-CM | POA: Diagnosis not present

## 2016-10-30 NOTE — Telephone Encounter (Signed)
User: Cherie Dark A Date/time: 10/29/16 1:26 PM  Comment: Called pt and lmsg for her to CB to get scheduled for a sooner echo appt.   Context:  Outcome: Left Message  Phone number: [phone] Phone Type: Mobile  Comm. type: Telephone Call type: Outgoing  Contact: Juliann Pulse D Relation to patient: Self

## 2016-11-01 ENCOUNTER — Encounter: Payer: Self-pay | Admitting: Hematology

## 2016-11-04 ENCOUNTER — Ambulatory Visit (HOSPITAL_COMMUNITY): Payer: Medicare Other | Attending: Cardiology

## 2016-11-04 ENCOUNTER — Other Ambulatory Visit: Payer: Self-pay

## 2016-11-04 DIAGNOSIS — I351 Nonrheumatic aortic (valve) insufficiency: Secondary | ICD-10-CM

## 2016-11-04 DIAGNOSIS — I083 Combined rheumatic disorders of mitral, aortic and tricuspid valves: Secondary | ICD-10-CM | POA: Diagnosis not present

## 2016-11-04 DIAGNOSIS — I313 Pericardial effusion (noninflammatory): Secondary | ICD-10-CM | POA: Insufficient documentation

## 2016-11-04 DIAGNOSIS — I253 Aneurysm of heart: Secondary | ICD-10-CM | POA: Insufficient documentation

## 2016-11-04 DIAGNOSIS — I340 Nonrheumatic mitral (valve) insufficiency: Secondary | ICD-10-CM

## 2016-11-04 DIAGNOSIS — I099 Rheumatic heart disease, unspecified: Secondary | ICD-10-CM | POA: Diagnosis not present

## 2016-11-04 DIAGNOSIS — I071 Rheumatic tricuspid insufficiency: Secondary | ICD-10-CM

## 2016-11-05 HISTORY — PX: ABDOMINAL SURGERY: SHX537

## 2016-11-06 ENCOUNTER — Ambulatory Visit: Payer: Medicare Other | Attending: General Surgery | Admitting: Physical Therapy

## 2016-11-06 ENCOUNTER — Other Ambulatory Visit: Payer: Self-pay

## 2016-11-06 ENCOUNTER — Encounter: Payer: Self-pay | Admitting: Physical Therapy

## 2016-11-06 ENCOUNTER — Encounter (HOSPITAL_COMMUNITY)
Admission: RE | Admit: 2016-11-06 | Discharge: 2016-11-06 | Disposition: A | Discharge status: Home / Self Care | Payer: Medicare Other | Source: Ambulatory Visit | Attending: General Surgery | Admitting: General Surgery

## 2016-11-06 ENCOUNTER — Encounter (HOSPITAL_COMMUNITY): Payer: Self-pay

## 2016-11-06 DIAGNOSIS — R001 Bradycardia, unspecified: Secondary | ICD-10-CM | POA: Diagnosis not present

## 2016-11-06 DIAGNOSIS — I351 Nonrheumatic aortic (valve) insufficiency: Secondary | ICD-10-CM | POA: Insufficient documentation

## 2016-11-06 DIAGNOSIS — I099 Rheumatic heart disease, unspecified: Secondary | ICD-10-CM | POA: Diagnosis not present

## 2016-11-06 DIAGNOSIS — M545 Low back pain: Secondary | ICD-10-CM | POA: Insufficient documentation

## 2016-11-06 DIAGNOSIS — M069 Rheumatoid arthritis, unspecified: Secondary | ICD-10-CM | POA: Diagnosis not present

## 2016-11-06 DIAGNOSIS — I10 Essential (primary) hypertension: Secondary | ICD-10-CM | POA: Diagnosis not present

## 2016-11-06 DIAGNOSIS — Z01812 Encounter for preprocedural laboratory examination: Secondary | ICD-10-CM | POA: Diagnosis not present

## 2016-11-06 DIAGNOSIS — M6281 Muscle weakness (generalized): Secondary | ICD-10-CM | POA: Insufficient documentation

## 2016-11-06 DIAGNOSIS — Z87891 Personal history of nicotine dependence: Secondary | ICD-10-CM | POA: Insufficient documentation

## 2016-11-06 DIAGNOSIS — G8929 Other chronic pain: Secondary | ICD-10-CM | POA: Diagnosis present

## 2016-11-06 DIAGNOSIS — Z01810 Encounter for preprocedural cardiovascular examination: Secondary | ICD-10-CM | POA: Insufficient documentation

## 2016-11-06 HISTORY — DX: Anemia, unspecified: D64.9

## 2016-11-06 LAB — CBC WITH DIFFERENTIAL/PLATELET
BASOS ABS: 0 10*3/uL (ref 0.0–0.1)
Basophils Relative: 1 %
Eosinophils Absolute: 0.2 10*3/uL (ref 0.0–0.7)
Eosinophils Relative: 4 %
HEMATOCRIT: 35.2 % — AB (ref 36.0–46.0)
HEMOGLOBIN: 11.6 g/dL — AB (ref 12.0–15.0)
LYMPHS PCT: 46 %
Lymphs Abs: 2 10*3/uL (ref 0.7–4.0)
MCH: 29.1 pg (ref 26.0–34.0)
MCHC: 33 g/dL (ref 30.0–36.0)
MCV: 88.2 fL (ref 78.0–100.0)
MONO ABS: 0.4 10*3/uL (ref 0.1–1.0)
MONOS PCT: 9 %
NEUTROS ABS: 1.7 10*3/uL (ref 1.7–7.7)
Neutrophils Relative %: 40 %
Platelets: 244 10*3/uL (ref 150–400)
RBC: 3.99 MIL/uL (ref 3.87–5.11)
RDW: 14.9 % (ref 11.5–15.5)
WBC: 4.2 10*3/uL (ref 4.0–10.5)

## 2016-11-06 LAB — COMPREHENSIVE METABOLIC PANEL
ALK PHOS: 116 U/L (ref 38–126)
ALT: 21 U/L (ref 14–54)
AST: 30 U/L (ref 15–41)
Albumin: 3.9 g/dL (ref 3.5–5.0)
Anion gap: 10 (ref 5–15)
BILIRUBIN TOTAL: 2.4 mg/dL — AB (ref 0.3–1.2)
BUN: 14 mg/dL (ref 6–20)
CALCIUM: 9.2 mg/dL (ref 8.9–10.3)
CO2: 23 mmol/L (ref 22–32)
Chloride: 105 mmol/L (ref 101–111)
Creatinine, Ser: 0.72 mg/dL (ref 0.44–1.00)
GFR calc Af Amer: >60 mL/min (ref >60)
Glucose, Bld: 131 mg/dL — ABNORMAL HIGH (ref 65–99)
POTASSIUM: 2.6 mmol/L — AB (ref 3.5–5.1)
Sodium: 138 mmol/L (ref 135–145)
TOTAL PROTEIN: 7.5 g/dL (ref 6.5–8.1)

## 2016-11-06 LAB — PROTIME-INR
INR: 0.96
Prothrombin Time: 12.7 seconds (ref 11.4–15.2)

## 2016-11-06 LAB — PREALBUMIN: PREALBUMIN: 17.7 mg/dL — AB (ref 18–38)

## 2016-11-06 LAB — PREPARE RBC (CROSSMATCH)

## 2016-11-06 NOTE — Progress Notes (Signed)
Anesthesia Chart Review:  Pt is a 76 year old female scheduled for diagnostic laparoscopy, Whipple, jejunostomy on 11/13/2016 with [name], MD  - PCP is Iona Beard, MD - Cardiologist is Lyman Bishop, MD - Oncologist is Truitt Merle, MD  I saw pt in PAT per Dr. Marlowe Aschoff request to review epidural for post-op pain control.  Pt expresses interest in the epidural.     PMH includes:  HTN, rheumatic heart disease, MR, aortic insufficiency, sinus bradycardia, rheumatoid arthritis. Former smoker. BMI 22.   Medications include: ASA 81 mg, Pepcid, Plendil  BP (!) 148/55   Pulse 92   Temp 36.6 C   Resp 18   Ht 5\' 5"  (1.651 m)   Wt 132 lb 3.2 oz (60 kg)   SpO2 100%   BMI 22.00 kg/m    Pre-operative labs reviewed. - K 2.6. MD on call for Dr. Barry Dienes notified by lab. Will recheck DOS.   CT chest 10/21/16:  1. No specific findings of metastatic disease in the chest. 2. Mild patchy consolidation in the basilar left lower lobe and patchy ground-glass attenuation at both lung bases, mildly increased since 09/24/2016 CT abdomen study, most suggestive of a mild nonspecific infectious or inflammatory bronchopneumonia. Recommend attention on follow-up post treatment chest CT in 3 months. 3. Pneumobilia and common bile duct stent placement in the visualized upper abdomen. 4. Stable ectatic 4.2 cm ascending thoracic aorta. Recommend annual imaging followup by CTA or MRA.  5. Aberrant right subclavian artery. 6. One vessel coronary atherosclerosis.  EKG 11/06/16: Sinus bradycardia (46 bpm) with sinus arrhythmia with 1st degree A-V block. Moderate voltage criteria for LVH, may be normal variant  Echo 11/04/16:  - Left ventricle: The cavity size was normal. Wall thickness was increased in a pattern of mild LVH. There was mild focal basal hypertrophy of the septum. Systolic function was normal. The estimated ejection fraction was in the range of 55% to 60%. Wall motion was normal; there were no regional wall  motion abnormalities. Doppler parameters are consistent with abnormal left ventricular relaxation (grade 1 diastolic dysfunction). - Aortic root: The aortic root was normal in size. - Aortic valve: There was mild regurgitation. - Mitral valve: There was mild to moderate regurgitation. - Atrial septum: There was an atrial septal aneurysm. - Tricuspid valve: There was moderate regurgitation. - Pulmonary arteries: Systolic pressure was mildly increased. PA peak pressure: 32 mm Hg (S). - Pericardium, extracardiac: A trivial pericardial effusion was identified. - Impressions: Normal LV systolic function; mild diastolic dysfunction; mild LVH; mild AI; mild to moderate MR; moderate TR with mildly elevated pulmonary pressure; large echolucent structure in liver likely represents cyst; suggest dedicated ultrasound to further assess.  If labs acceptable DOS, I anticipate pt can proceed as scheduled.   Willeen Cass, FNP-BC [facility]/Anesthesiology Phone: [phone] 11/07/2016 9:51 AM

## 2016-11-06 NOTE — Progress Notes (Signed)
Cardiologist Dr. Debara Pickett    Dr. Marlowe Aschoff office called to see if pt. Needed to do a bowel prep before surgery. Office said they did not see one ordered but would message Dr. Barry Dienes  To make sure.

## 2016-11-06 NOTE — Progress Notes (Signed)
I called the main desk of lab and was told that they had not received patient labs.  Labs were not in our lab or tube station.  I calle [facility] and was told that they are working on the tube system, tubes have not arrived to the destinations they were sent to.

## 2016-11-06 NOTE — Therapy (Signed)
[facility] [address] [place], [place], [address] Phone: [phone]   Fax:  [phone]  Physical Therapy Evaluation  Patient Details  Name: Rhonda Spencer MRN: [id number] Date of Birth: August 02, 1940 Referring Provider: [name], MD  Encounter Date: 11/06/2016      PT End of Session - 11/06/16 1311    Visit Number 1   Number of Visits 1   Date for PT Re-Evaluation 11/06/16  one time only   PT Start Time 1145   PT Stop Time 1228   PT Time Calculation (min) 43 min   Activity Tolerance Patient tolerated treatment well   Behavior During Therapy [facility] for tasks assessed/performed      Past Medical History:  Diagnosis Date  . AI (aortic insufficiency)   . History of nuclear stress test 11/2008   bruce myoview; normal pattern of perfusion in allregions, no significant wall motion abnormalities; no ECG changes, no significant ischemia demonstrated, low risk scan   . Hypertension   . MR (mitral regurgitation)   . Rheumatic fever   . Rheumatic heart disease   . Rheumatoid arthritis ([place])   . Sinus bradycardia     Past Surgical History:  Procedure Laterality Date  . ABDOMINAL HYSTERECTOMY    . CHOLECYSTECTOMY    . COLON SURGERY    . ERCP N/A 10/03/2016   Procedure: ENDOSCOPIC RETROGRADE CHOLANGIOPANCREATOGRAPHY (ERCP);  Surgeon: Carol Ada, MD;  Location: [facility];  Service: Endoscopy;  Laterality: N/A;  . EXCISIONAL HEMORRHOIDECTOMY    . HEMI-MICRODISCECTOMY LUMBAR LAMINECTOMY LEVEL 4  1995  . TRANSTHORACIC ECHOCARDIOGRAM  11/2011   EF=>55%; LA mild-mod dilated; mod-severe MR; mild-mod TR with normal systolic pressure; mild-mod AV regurg; mild pulm valve regurg   . UPPER ESOPHAGEAL ENDOSCOPIC ULTRASOUND (EUS) N/A 09/26/2016   Procedure: UPPER ESOPHAGEAL ENDOSCOPIC ULTRASOUND (EUS);  Surgeon: Carol Ada, MD;  Location: [facility];  Service: Endoscopy;  Laterality: N/A;  . UPPER ESOPHAGEAL ENDOSCOPIC  ULTRASOUND (EUS) N/A 10/03/2016   Procedure: UPPER ESOPHAGEAL ENDOSCOPIC ULTRASOUND (EUS);  Surgeon: Carol Ada, MD;  Location: [facility];  Service: Endoscopy;  Laterality: N/A;    There were no vitals filed for this visit.       Subjective Assessment - 11/06/16 1151    Subjective Patient reports she has been feeling overall weak and unable to stand and walk as well.  Patients states she has back pain that is 7/10 when standing and walking for a long time. States she is using a cane due difficulty with balance that started a couple of years ago.  Ptatient is having diarrhea due to cancer.  She denies incontinence of urine.  Reports small amount of leakage when having diarrhea.     Pertinent History surgery for back 20 years ago, chronic back pain   Limitations Sitting;Walking   How long can you stand comfortably? < 5 minutes   How long can you walk comfortably? 5-10 minutes   Patient Stated Goals be able to go back to silver sneakers, and be able to walk and stand longer   Currently in Pain? No/denies            [facility] PT Assessment - 11/06/16 0001      Assessment   Medical Diagnosis C25.9 (ICD-10-CM) - Pancreatic cancer [facility]   Referring Provider [name], MD   Prior Therapy No     Precautions   Precautions None     Restrictions   Weight Bearing Restrictions No  Balance Screen   Has the patient fallen in the past 6 months No     Home Environment   Living Environment Private residence   Living Arrangements Children  daughter     Prior Function   Level of Independence Independent     Cognition   Overall Cognitive Status Within Functional Limits for tasks assessed     Posture/Postural Control   Posture/Postural Control Postural limitations   Postural Limitations Rounded Shoulders;Increased thoracic kyphosis     ROM / Strength   AROM / PROM / Strength Strength     Strength   Overall Strength Comments Lt LE 4-/5; Rt LE 4/5     Ambulation/Gait   Gait  Pattern Decreased stride length;Trunk flexed     Standardized Balance Assessment   Five times sit to stand comments  31 sec            Objective measurements completed on examination: See above findings.                  PT Education - Nov 18, 2016 1310    Education provided Yes   Education Details HEP as seen in chart   Person(s) Educated Patient   Methods Explanation;Demonstration;Verbal cues;Handout;Tactile cues   Comprehension Verbalized understanding;Returned demonstration             PT Long Term Goals - 2016/11/18 1311      PT LONG TERM GOAL #1   Title Patient will receive HEP for strengthening prior to surgery   Time 1   Period Days   Status Achieved   Target Date 18-Nov-2016                Plan - November 18, 2016 1314    Clinical Impression Statement Patient presents to clinic due to weakness.  Patient is undergoing surgery due to pancreatic cancer next week and would like to strengthen before surgery.  Patient demonstrates bilateral LE weakness.  Patient has decreased endurance and activity tolerance due to weakness and low back pain.  Decreased balance based on 5x sit to stand at 31 seconds.  Patient did well with exercises provided to increase LE and core strength prior to surgery.     History and Personal Factors relevant to plan of care: pancreatic cancer, surgery in 1 week   Clinical Presentation Stable   Clinical Presentation due to: weakness due to illness   Clinical Decision Making Low   Rehab Potential Good   Clinical Impairments Affecting Rehab Potential pancreatic cancer, upcoming surgery   PT Frequency One time visit   PT Treatment/Interventions ADLs/Self Care Home Management;Therapeutic exercise;Functional mobility training;Patient/family education   PT Next Visit Plan one time visit due to surgery in one week   Consulted and Agree with Plan of Care Patient      Patient will benefit from skilled therapeutic intervention in order to  improve the following deficits and impairments:  Pain, Decreased strength  Visit Diagnosis: Muscle weakness (generalized)  Chronic low back pain, unspecified back pain laterality, with sciatica presence unspecified      G-Codes - 18-Nov-2016 1313    Functional Assessment Tool Used (Outpatient Only) 5x sit to stand, clinical impression   Functional Limitation Changing and maintaining body position   Changing and Maintaining Body Position Current Status (Q6761) At least 40 percent but less than 60 percent impaired, limited or restricted   Changing and Maintaining Body Position Goal Status (P5093) At least 40 percent but less than 60 percent impaired, limited or restricted  Changing and Maintaining Body Position Discharge Status [id number]) At least 40 percent but less than 60 percent impaired, limited or restricted       Problem List Patient Active Problem List   Diagnosis Date Noted  . Pancreatic cancer ([place]) 10/14/2016  . Sinus bradycardia 09/23/2015  . Rheumatoid arthritis ([place]) 09/23/2015  . Tricuspid regurgitation 09/23/2015  . Near syncope 09/22/2015  . Orthostasis 09/22/2015  . Essential hypertension 09/22/2015  . Chronic pain 09/22/2015  . GERD (gastroesophageal reflux disease) 04/19/2015  . Abdominal pain, chronic, right lower quadrant 10/12/2012  . AI (aortic insufficiency) 09/09/2012  . Mitral regurgitation 09/09/2012  . Rheumatic heart disease 09/09/2012  . HTN (hypertension) 09/09/2012  . Pre-operative cardiovascular examination 09/09/2012  . Abdominal mass, RLQ (right lower quadrant) 09/07/2012    Zannie Cove, PT 11/06/2016, 1:36 PM  [facility] [address] [place], [place], [address] Phone: [phone]   Fax:  [phone]  Name: Rhonda Spencer MRN: [id number] Date of Birth: Nov 23, 1940

## 2016-11-06 NOTE — Pre-Procedure Instructions (Signed)
Rhonda Spencer  11/06/2016      [facility] [address] [place] [address] [place] [place] [address] Phone: [phone] Fax: [phone]    Your procedure is scheduled on 11-13-2016 .  Report to [facility] Admitting at 5:30 AM    Call this number if you have problems the morning of surgery:  [phone]   Remember:  Do not eat food or drink liquids after midnight.   Take these medicines the morning of surgery with A SIP OF WATER pepcid,felodipine   STOP ASPIRIN,ANTIINFLAMATORIES (IBUPROFEN,ALEVE,MOTRIN,ADVIL,GOODY'S POWDERS),HERBAL SUPPLEMENTS,FISH OIL,AND VITAMINS 5-7 DAYS PRIOR TO SURGERY   Do not wear jewelry, make-up or nail polish.  Do not wear lotions, powders, or perfumes, or deoderant.  Do not shave 48 hours prior to surgery.    Do not bring valuables to the hospital.   [facility] is not responsible for any belongings or valuables.  Contacts, dentures or bridgework may not be worn into surgery.  Leave your suitcase in the car.  After surgery it may be brought to your room.  For patients admitted to the hospital, discharge time will be determined by your treatment team.  Patients discharged the day of surgery will not be allowed to drive home.     Special Instructions: [facility] - Preparing for Surgery  Before surgery, you can play an important role.  Because skin is not sterile, your skin needs to be as free of germs as possible.  You can reduce the number of germs on you skin by washing with CHG (chlorahexidine gluconate) soap before surgery.  CHG is an antiseptic cleaner which kills germs and bonds with the skin to continue killing germs even after washing.  Please DO NOT use if you have an allergy to CHG or antibacterial soaps.  If your skin becomes reddened/irritated stop using the CHG and inform your nurse when you arrive at Short Stay.  Do not shave (including legs and underarms) for at least 48  hours prior to the first CHG shower.  You may shave your face.  Please follow these instructions carefully:   1.  Shower with CHG Soap the night before surgery and the   morning of Surgery.  2.  If you choose to wash your hair, wash your hair first as usual with your normal shampoo.  3.  After you shampoo, rinse your hair and body thoroughly to remove the  Shampoo.  4.  Use CHG as you would any other liquid soap.  You can apply chg directly  to the skin and wash gently with scrungie or a clean washcloth.  5.  Apply the CHG Soap to your body ONLY FROM THE NECK DOWN.   Do not use on open wounds or open sores.  Avoid contact with your eyes,  ears, mouth and genitals (private parts).  Wash genitals (private parts) with your normal soap.  6.  Wash thoroughly, paying special attention to the area where your surgery will be performed.  7.  Thoroughly rinse your body with warm water from the neck down.  8.  DO NOT shower/wash with your normal soap after using and rinsing o  the CHG Soap.  9.  Pat yourself dry with a clean towel.            10.  Wear clean pajamas.            11.  Place clean sheets on your bed the night of your  first shower and do not sleep with pets.  Day of Surgery  Do not apply any lotions/deodorants the morning of surgery.  Please wear clean clothes to the hospital/surgery center.   Please read over the following fact sheets that you were given. Coughing and Deep Breathing and Surgical Site Infection Prevention

## 2016-11-06 NOTE — Patient Instructions (Signed)
   Transverse Abdominus Activation  Contract your lower abdominals as if you were trying to lift one leg from the table.  Initiate the movement but do no lift foot greater than 1 inch from the table.  Repeat opposite side. Hold 1 second, Do 20x each side    STRAIGHT LEG RAISE - SLR  While lying on your back, raise up your leg with a straight knee.  Keep the opposite knee bent with the foot planted on the ground. Do 2 sets of 5 reps each side   BRIDGING  While lying on your back, tighten your lower abdominals, squeeze your buttocks and then raise your buttocks off the floor/bed as creating a "Bridge" with your body. Hold and then lower yourself and repeat. 20x    CLAM SHELLS  While lying on your side with your knees bent, draw up the top knee while keeping contact of your feet together.  Do not let your pelvis roll back during the lifting movement. Repeat 20x each side    SIT STAND - BOTH HANDS ASSIST  While seated in a chair, scoot forward towards the edge of the chair. Next, hold on to the arm rest with both hands for support and then raise up to standing. Use hands as little as possible, using buttocks and legs to lift you from the seat.  Repeat 5x do 2 sets/day     Kick leg out and hold for 2 seconds, repeat 20x each leg

## 2016-11-07 ENCOUNTER — Other Ambulatory Visit (HOSPITAL_COMMUNITY): Payer: Self-pay | Admitting: *Deleted

## 2016-11-07 LAB — ABO/RH: ABO/RH(D): AB POS

## 2016-11-10 ENCOUNTER — Other Ambulatory Visit: Payer: Self-pay | Admitting: General Surgery

## 2016-11-12 ENCOUNTER — Encounter (HOSPITAL_COMMUNITY): Payer: Self-pay | Admitting: Anesthesiology

## 2016-11-12 ENCOUNTER — Ambulatory Visit: Payer: Medicare Other | Admitting: Student

## 2016-11-12 DIAGNOSIS — I10 Essential (primary) hypertension: Secondary | ICD-10-CM | POA: Diagnosis not present

## 2016-11-12 DIAGNOSIS — I2510 Atherosclerotic heart disease of native coronary artery without angina pectoris: Secondary | ICD-10-CM | POA: Diagnosis not present

## 2016-11-12 DIAGNOSIS — R634 Abnormal weight loss: Secondary | ICD-10-CM | POA: Diagnosis not present

## 2016-11-12 DIAGNOSIS — C259 Malignant neoplasm of pancreas, unspecified: Secondary | ICD-10-CM | POA: Diagnosis not present

## 2016-11-12 NOTE — Anesthesia Preprocedure Evaluation (Addendum)
Anesthesia Evaluation  Patient identified by MRN, date of birth, ID band Patient awake    Reviewed: Allergy & Precautions, NPO status , Patient's Chart, lab work & pertinent test results  Airway Mallampati: II  TM Distance: >3 FB Neck ROM: Full    Dental  (+) Partial Lower, Partial Upper, Poor Dentition   Pulmonary former smoker,    Pulmonary exam normal breath sounds clear to auscultation       Cardiovascular hypertension, Pt. on medications Normal cardiovascular exam Rhythm:Regular Rate:Normal  Hx/o Rheumatic heart disease AI, TR, MR- mild   Neuro/Psych negative neurological ROS  negative psych ROS   GI/Hepatic Neg liver ROS, GERD  Medicated and Controlled,Pancreatic Ca   Endo/Other  negative endocrine ROS  Renal/GU negative Renal ROS  negative genitourinary   Musculoskeletal  (+) Arthritis , Rheumatoid disorders,    Abdominal   Peds  Hematology  (+) anemia ,   Anesthesia Other Findings   Reproductive/Obstetrics                            Anesthesia Physical Anesthesia Plan  ASA: III  Anesthesia Plan: General   Post-op Pain Management:    Induction: Intravenous  PONV Risk Score and Plan: 4 or greater and Ondansetron, Dexamethasone, Midazolam, Propofol infusion, Promethazine and Treatment may vary due to age or medical condition  Airway Management Planned: Oral ETT  Additional Equipment: Arterial line  Intra-op Plan:   Post-operative Plan: Extubation in OR  Informed Consent: I have reviewed the patients History and Physical, chart, labs and discussed the procedure including the risks, benefits and alternatives for the proposed anesthesia with the patient or authorized representative who has indicated his/her understanding and acceptance.   Dental advisory given  Plan Discussed with: CRNA, Anesthesiologist and Surgeon  Anesthesia Plan Comments:        Anesthesia  Quick Evaluation

## 2016-11-13 ENCOUNTER — Encounter (HOSPITAL_COMMUNITY): Payer: Self-pay

## 2016-11-13 ENCOUNTER — Inpatient Hospital Stay (HOSPITAL_COMMUNITY): Payer: Medicare Other | Admitting: Emergency Medicine

## 2016-11-13 ENCOUNTER — Encounter (HOSPITAL_COMMUNITY)
Admission: RE | Disposition: A | Discharge status: Skilled Nursing Facility | Payer: Self-pay | Source: Ambulatory Visit | Attending: General Surgery

## 2016-11-13 ENCOUNTER — Inpatient Hospital Stay (HOSPITAL_COMMUNITY)
Admission: RE | Admit: 2016-11-13 | Discharge: 2016-12-04 | DRG: 406 | Disposition: A | Discharge status: Skilled Nursing Facility | Payer: Medicare Other | Source: Ambulatory Visit | Attending: General Surgery | Admitting: General Surgery

## 2016-11-13 DIAGNOSIS — R531 Weakness: Secondary | ICD-10-CM | POA: Diagnosis not present

## 2016-11-13 DIAGNOSIS — I4581 Long QT syndrome: Secondary | ICD-10-CM | POA: Diagnosis not present

## 2016-11-13 DIAGNOSIS — Z7982 Long term (current) use of aspirin: Secondary | ICD-10-CM

## 2016-11-13 DIAGNOSIS — Z9842 Cataract extraction status, left eye: Secondary | ICD-10-CM | POA: Diagnosis not present

## 2016-11-13 DIAGNOSIS — Z9841 Cataract extraction status, right eye: Secondary | ICD-10-CM

## 2016-11-13 DIAGNOSIS — I10 Essential (primary) hypertension: Secondary | ICD-10-CM | POA: Diagnosis present

## 2016-11-13 DIAGNOSIS — R1084 Generalized abdominal pain: Secondary | ICD-10-CM | POA: Diagnosis not present

## 2016-11-13 DIAGNOSIS — C241 Malignant neoplasm of ampulla of Vater: Secondary | ICD-10-CM | POA: Diagnosis present

## 2016-11-13 DIAGNOSIS — Z79899 Other long term (current) drug therapy: Secondary | ICD-10-CM | POA: Diagnosis not present

## 2016-11-13 DIAGNOSIS — E440 Moderate protein-calorie malnutrition: Secondary | ICD-10-CM | POA: Diagnosis present

## 2016-11-13 DIAGNOSIS — Z6821 Body mass index (BMI) 21.0-21.9, adult: Secondary | ICD-10-CM

## 2016-11-13 DIAGNOSIS — R001 Bradycardia, unspecified: Secondary | ICD-10-CM | POA: Diagnosis not present

## 2016-11-13 DIAGNOSIS — Z888 Allergy status to other drugs, medicaments and biological substances status: Secondary | ICD-10-CM

## 2016-11-13 DIAGNOSIS — R102 Pelvic and perineal pain: Secondary | ICD-10-CM | POA: Diagnosis not present

## 2016-11-13 DIAGNOSIS — Z87891 Personal history of nicotine dependence: Secondary | ICD-10-CM | POA: Diagnosis not present

## 2016-11-13 DIAGNOSIS — C259 Malignant neoplasm of pancreas, unspecified: Secondary | ICD-10-CM | POA: Diagnosis present

## 2016-11-13 DIAGNOSIS — B252 Cytomegaloviral pancreatitis: Secondary | ICD-10-CM | POA: Diagnosis not present

## 2016-11-13 DIAGNOSIS — E119 Type 2 diabetes mellitus without complications: Secondary | ICD-10-CM | POA: Diagnosis not present

## 2016-11-13 DIAGNOSIS — R739 Hyperglycemia, unspecified: Secondary | ICD-10-CM | POA: Diagnosis not present

## 2016-11-13 DIAGNOSIS — C801 Malignant (primary) neoplasm, unspecified: Secondary | ICD-10-CM | POA: Diagnosis not present

## 2016-11-13 DIAGNOSIS — G8918 Other acute postprocedural pain: Secondary | ICD-10-CM | POA: Diagnosis not present

## 2016-11-13 DIAGNOSIS — C772 Secondary and unspecified malignant neoplasm of intra-abdominal lymph nodes: Secondary | ICD-10-CM | POA: Diagnosis not present

## 2016-11-13 DIAGNOSIS — R109 Unspecified abdominal pain: Secondary | ICD-10-CM | POA: Diagnosis not present

## 2016-11-13 DIAGNOSIS — R112 Nausea with vomiting, unspecified: Secondary | ICD-10-CM | POA: Diagnosis not present

## 2016-11-13 DIAGNOSIS — K219 Gastro-esophageal reflux disease without esophagitis: Secondary | ICD-10-CM | POA: Diagnosis present

## 2016-11-13 DIAGNOSIS — M0569 Rheumatoid arthritis of multiple sites with involvement of other organs and systems: Secondary | ICD-10-CM | POA: Diagnosis not present

## 2016-11-13 DIAGNOSIS — R1903 Right lower quadrant abdominal swelling, mass and lump: Secondary | ICD-10-CM | POA: Diagnosis not present

## 2016-11-13 DIAGNOSIS — C253 Malignant neoplasm of pancreatic duct: Secondary | ICD-10-CM | POA: Diagnosis not present

## 2016-11-13 DIAGNOSIS — I019 Acute rheumatic heart disease, unspecified: Secondary | ICD-10-CM | POA: Diagnosis not present

## 2016-11-13 DIAGNOSIS — G894 Chronic pain syndrome: Secondary | ICD-10-CM | POA: Diagnosis not present

## 2016-11-13 DIAGNOSIS — F419 Anxiety disorder, unspecified: Secondary | ICD-10-CM | POA: Diagnosis present

## 2016-11-13 DIAGNOSIS — C250 Malignant neoplasm of head of pancreas: Secondary | ICD-10-CM | POA: Diagnosis present

## 2016-11-13 DIAGNOSIS — M6281 Muscle weakness (generalized): Secondary | ICD-10-CM | POA: Diagnosis not present

## 2016-11-13 HISTORY — PX: LAPAROSCOPY: SHX197

## 2016-11-13 HISTORY — PX: JEJUNOSTOMY: SHX313

## 2016-11-13 HISTORY — PX: WHIPPLE PROCEDURE: SHX2667

## 2016-11-13 LAB — POCT I-STAT 7, (LYTES, BLD GAS, ICA,H+H)
Acid-base deficit: 7 mmol/L — ABNORMAL HIGH (ref 0.0–2.0)
Bicarbonate: 18.8 mmol/L — ABNORMAL LOW (ref 20.0–28.0)
Calcium, Ion: 1.25 mmol/L (ref 1.15–1.40)
HCT: 27 % — ABNORMAL LOW (ref 36.0–46.0)
Hemoglobin: 9.2 g/dL — ABNORMAL LOW (ref 12.0–15.0)
O2 Saturation: 100 %
PCO2 ART: 39.5 mmHg (ref 32.0–48.0)
PH ART: 7.285 — AB (ref 7.350–7.450)
PO2 ART: 243 mmHg — AB (ref 83.0–108.0)
Potassium: 4.9 mmol/L (ref 3.5–5.1)
Sodium: 138 mmol/L (ref 135–145)
TCO2: 20 mmol/L (ref 0–100)

## 2016-11-13 LAB — POCT I-STAT 4, (NA,K, GLUC, HGB,HCT)
GLUCOSE: 133 mg/dL — AB (ref 65–99)
Glucose, Bld: 249 mg/dL — ABNORMAL HIGH (ref 65–99)
HCT: 31 % — ABNORMAL LOW (ref 36.0–46.0)
HCT: 29 % — ABNORMAL LOW (ref 36.0–46.0)
Hemoglobin: 10.5 g/dL — ABNORMAL LOW (ref 12.0–15.0)
Hemoglobin: 9.9 g/dL — ABNORMAL LOW (ref 12.0–15.0)
POTASSIUM: 5.4 mmol/L — AB (ref 3.5–5.1)
POTASSIUM: 4.9 mmol/L (ref 3.5–5.1)
SODIUM: 137 mmol/L (ref 135–145)
Sodium: 138 mmol/L (ref 135–145)

## 2016-11-13 LAB — URINALYSIS, ROUTINE W REFLEX MICROSCOPIC
BACTERIA UA: NONE SEEN
Bilirubin Urine: NEGATIVE
Glucose, UA: 50 mg/dL — AB
Hgb urine dipstick: NEGATIVE
Ketones, ur: NEGATIVE mg/dL
Nitrite: NEGATIVE
PH: 5 (ref 5.0–8.0)
Protein, ur: NEGATIVE mg/dL
Specific Gravity, Urine: 1.015 (ref 1.005–1.030)

## 2016-11-13 LAB — GLUCOSE, CAPILLARY: GLUCOSE-CAPILLARY: 109 mg/dL — AB (ref 65–99)

## 2016-11-13 LAB — CBC
HEMATOCRIT: 21.4 % — AB (ref 36.0–46.0)
HEMOGLOBIN: 7.2 g/dL — AB (ref 12.0–15.0)
MCH: 28.9 pg (ref 26.0–34.0)
MCHC: 33.6 g/dL (ref 30.0–36.0)
MCV: 85.9 fL (ref 78.0–100.0)
Platelets: 133 10*3/uL — ABNORMAL LOW (ref 150–400)
RBC: 2.49 MIL/uL — ABNORMAL LOW (ref 3.87–5.11)
RDW: 13.9 % (ref 11.5–15.5)
WBC: 4 10*3/uL (ref 4.0–10.5)

## 2016-11-13 LAB — PREPARE RBC (CROSSMATCH)

## 2016-11-13 SURGERY — LAPAROSCOPY, DIAGNOSTIC
Anesthesia: General | Site: Abdomen

## 2016-11-13 MED ORDER — PHENYLEPHRINE 40 MCG/ML (10ML) SYRINGE FOR IV PUSH (FOR BLOOD PRESSURE SUPPORT)
PREFILLED_SYRINGE | INTRAVENOUS | Status: AC
  Filled 2016-11-13: qty 10

## 2016-11-13 MED ORDER — DIPHENHYDRAMINE HCL 12.5 MG/5ML PO ELIX: 12.5000 mg | ORAL_SOLUTION | Freq: Four times a day (QID) | ORAL | Status: DC | PRN

## 2016-11-13 MED ORDER — TRIAMCINOLONE ACETONIDE 0.5 % EX CREA: TOPICAL_CREAM | Freq: Two times a day (BID) | CUTANEOUS | Status: DC | PRN

## 2016-11-13 MED ORDER — LIDOCAINE 5 % EX PTCH
1.0000 | MEDICATED_PATCH | Freq: Every day | CUTANEOUS | Status: DC | PRN
  Administered 2016-11-15 – 2016-11-26 (×8): 1 via TRANSDERMAL
  Filled 2016-11-13 (×11): qty 1

## 2016-11-13 MED ORDER — IBUPROFEN 100 MG/5ML PO SUSP: 200.0000 mg | Freq: Four times a day (QID) | ORAL | Status: DC | PRN

## 2016-11-13 MED ORDER — ONDANSETRON HCL 4 MG/2ML IJ SOLN: 4.0000 mg | Freq: Once | INTRAMUSCULAR | Status: DC | PRN

## 2016-11-13 MED ORDER — ONDANSETRON HCL 4 MG/2ML IJ SOLN
INTRAMUSCULAR | Status: AC
  Filled 2016-11-13: qty 2

## 2016-11-13 MED ORDER — ROPIVACAINE HCL 2 MG/ML IJ SOLN
10.0000 mL/h | INTRAMUSCULAR | Status: DC
  Administered 2016-11-13: 6 mL/h via EPIDURAL
  Administered 2016-11-13: 10 mL/h via EPIDURAL

## 2016-11-13 MED ORDER — OXYCODONE HCL 5 MG/5ML PO SOLN: 5.0000 mg | Freq: Once | ORAL | Status: DC | PRN

## 2016-11-13 MED ORDER — LACTATED RINGERS IV SOLN
INTRAVENOUS | Status: DC | PRN
  Administered 2016-11-13 (×3): via INTRAVENOUS

## 2016-11-13 MED ORDER — EVICEL 5 ML EX KIT
PACK | CUTANEOUS | Status: AC
Start: 2016-11-13 — End: 2016-11-13
  Filled 2016-11-13: qty 1

## 2016-11-13 MED ORDER — INSULIN ASPART 100 UNIT/ML ~~LOC~~ SOLN
SUBCUTANEOUS | Status: DC | PRN
  Administered 2016-11-13: 5 [IU] via SUBCUTANEOUS

## 2016-11-13 MED ORDER — IBUPROFEN 200 MG PO TABS: 200.0000 mg | ORAL_TABLET | Freq: Four times a day (QID) | ORAL | Status: DC | PRN

## 2016-11-13 MED ORDER — ACETAMINOPHEN 500 MG PO TABS
1000.0000 mg | ORAL_TABLET | ORAL | Status: AC
  Administered 2016-11-13: 1000 mg via ORAL

## 2016-11-13 MED ORDER — INSULIN ASPART 100 UNIT/ML ~~LOC~~ SOLN
SUBCUTANEOUS | Status: AC
  Filled 2016-11-13: qty 1

## 2016-11-13 MED ORDER — LACTATED RINGERS IV SOLN
INTRAVENOUS | Status: DC | PRN
  Administered 2016-11-13 (×2): via INTRAVENOUS

## 2016-11-13 MED ORDER — MEPERIDINE HCL 25 MG/ML IJ SOLN: 6.2500 mg | INTRAMUSCULAR | Status: DC | PRN

## 2016-11-13 MED ORDER — CEFAZOLIN SODIUM-DEXTROSE 2-4 GM/100ML-% IV SOLN
2.0000 g | Freq: Three times a day (TID) | INTRAVENOUS | Status: AC
  Administered 2016-11-13: 2 g via INTRAVENOUS
  Filled 2016-11-13: qty 100

## 2016-11-13 MED ORDER — LIDOCAINE HCL (PF) 1 % IJ SOLN
INTRAMUSCULAR | Status: AC
Start: 2016-11-13 — End: 2016-11-13
  Filled 2016-11-13: qty 30

## 2016-11-13 MED ORDER — ALBUMIN HUMAN 5 % IV SOLN
INTRAVENOUS | Status: DC | PRN
  Administered 2016-11-13 (×2): via INTRAVENOUS

## 2016-11-13 MED ORDER — CEFAZOLIN SODIUM-DEXTROSE 2-4 GM/100ML-% IV SOLN
2.0000 g | INTRAVENOUS | Status: AC
  Administered 2016-11-13: 2 g via INTRAVENOUS

## 2016-11-13 MED ORDER — FENTANYL CITRATE (PF) 100 MCG/2ML IJ SOLN: 25.0000 ug | INTRAMUSCULAR | Status: DC | PRN

## 2016-11-13 MED ORDER — SODIUM CHLORIDE 0.9% FLUSH: 9.0000 mL | INTRAVENOUS | Status: DC | PRN

## 2016-11-13 MED ORDER — KCL IN DEXTROSE-NACL 20-5-0.45 MEQ/L-%-% IV SOLN
INTRAVENOUS | Status: DC
  Administered 2016-11-13: 20:00:00 via INTRAVENOUS
  Administered 2016-11-15: 1000 mL via INTRAVENOUS
  Administered 2016-11-16 – 2016-11-25 (×15): via INTRAVENOUS
  Filled 2016-11-13 (×23): qty 1000

## 2016-11-13 MED ORDER — DIPHENHYDRAMINE HCL 50 MG/ML IJ SOLN: 12.5000 mg | Freq: Four times a day (QID) | INTRAMUSCULAR | Status: DC | PRN

## 2016-11-13 MED ORDER — NALOXONE HCL 0.4 MG/ML IJ SOLN: 0.4000 mg | INTRAMUSCULAR | Status: DC | PRN

## 2016-11-13 MED ORDER — GABAPENTIN 300 MG PO CAPS
ORAL_CAPSULE | ORAL | Status: AC
  Administered 2016-11-13: 300 mg via ORAL
  Filled 2016-11-13: qty 1

## 2016-11-13 MED ORDER — ROCURONIUM BROMIDE 100 MG/10ML IV SOLN
INTRAVENOUS | Status: DC | PRN
  Administered 2016-11-13 (×2): 10 mg via INTRAVENOUS
  Administered 2016-11-13: 20 mg via INTRAVENOUS
  Administered 2016-11-13: 40 mg via INTRAVENOUS

## 2016-11-13 MED ORDER — METHOCARBAMOL 1000 MG/10ML IJ SOLN
500.0000 mg | Freq: Three times a day (TID) | INTRAVENOUS | Status: DC | PRN
  Administered 2016-11-14: 500 mg via INTRAVENOUS
  Filled 2016-11-13 (×3): qty 5

## 2016-11-13 MED ORDER — LIDOCAINE HCL 1 % IJ SOLN
INTRAMUSCULAR | Status: DC | PRN
  Administered 2016-11-13: 2.5 mL

## 2016-11-13 MED ORDER — GABAPENTIN 300 MG PO CAPS
300.0000 mg | ORAL_CAPSULE | ORAL | Status: AC
  Administered 2016-11-13: 300 mg via ORAL

## 2016-11-13 MED ORDER — SUGAMMADEX SODIUM 200 MG/2ML IV SOLN
INTRAVENOUS | Status: AC
  Filled 2016-11-13: qty 2

## 2016-11-13 MED ORDER — OXYCODONE HCL 5 MG PO TABS: 5.0000 mg | ORAL_TABLET | Freq: Once | ORAL | Status: DC | PRN

## 2016-11-13 MED ORDER — LIDOCAINE-EPINEPHRINE 2 %-1:100000 IJ SOLN
INTRAMUSCULAR | Status: AC
  Filled 2016-11-13: qty 1

## 2016-11-13 MED ORDER — PROCHLORPERAZINE EDISYLATE 5 MG/ML IJ SOLN
5.0000 mg | Freq: Four times a day (QID) | INTRAMUSCULAR | Status: DC | PRN
  Administered 2016-11-17: 5 mg via INTRAVENOUS
  Administered 2016-11-17 – 2016-11-28 (×11): 10 mg via INTRAVENOUS
  Filled 2016-11-13 (×12): qty 2

## 2016-11-13 MED ORDER — FENTANYL CITRATE (PF) 250 MCG/5ML IJ SOLN
INTRAMUSCULAR | Status: AC
  Filled 2016-11-13: qty 5

## 2016-11-13 MED ORDER — PHENYLEPHRINE HCL 10 MG/ML IJ SOLN
INTRAMUSCULAR | Status: DC | PRN
  Administered 2016-11-13 (×2): 80 ug via INTRAVENOUS

## 2016-11-13 MED ORDER — ROPIVACAINE HCL 2 MG/ML IJ SOLN
8.0000 mL/h | INTRAMUSCULAR | Status: DC
  Administered 2016-11-13 (×4): 5 mL via EPIDURAL
  Filled 2016-11-13 (×2): qty 200

## 2016-11-13 MED ORDER — SODIUM CHLORIDE 0.9 % IV SOLN: Freq: Once | INTRAVENOUS | Status: DC

## 2016-11-13 MED ORDER — ONDANSETRON HCL 4 MG/2ML IJ SOLN
4.0000 mg | Freq: Four times a day (QID) | INTRAMUSCULAR | Status: DC | PRN
  Administered 2016-11-17: 4 mg via INTRAVENOUS
  Filled 2016-11-13: qty 2

## 2016-11-13 MED ORDER — CHLORHEXIDINE GLUCONATE CLOTH 2 % EX PADS: 6.0000 | MEDICATED_PAD | Freq: Once | CUTANEOUS | Status: DC

## 2016-11-13 MED ORDER — ACETAMINOPHEN 10 MG/ML IV SOLN
1000.0000 mg | Freq: Four times a day (QID) | INTRAVENOUS | Status: AC
  Administered 2016-11-13 – 2016-11-14 (×4): 1000 mg via INTRAVENOUS
  Filled 2016-11-13 (×4): qty 100

## 2016-11-13 MED ORDER — ONDANSETRON HCL 4 MG/2ML IJ SOLN
INTRAMUSCULAR | Status: DC | PRN
  Administered 2016-11-13: 4 mg via INTRAVENOUS

## 2016-11-13 MED ORDER — CEFAZOLIN SODIUM-DEXTROSE 2-4 GM/100ML-% IV SOLN
INTRAVENOUS | Status: AC
  Filled 2016-11-13: qty 100

## 2016-11-13 MED ORDER — SUGAMMADEX SODIUM 200 MG/2ML IV SOLN
INTRAVENOUS | Status: DC | PRN
  Administered 2016-11-13: 200 mg via INTRAVENOUS

## 2016-11-13 MED ORDER — ROCURONIUM BROMIDE 10 MG/ML (PF) SYRINGE
PREFILLED_SYRINGE | INTRAVENOUS | Status: AC
  Filled 2016-11-13: qty 5

## 2016-11-13 MED ORDER — KETOTIFEN FUMARATE 0.025 % OP SOLN: 1.0000 [drp] | Freq: Every day | OPHTHALMIC | Status: DC | PRN

## 2016-11-13 MED ORDER — LIDOCAINE HCL (CARDIAC) 20 MG/ML IV SOLN
INTRAVENOUS | Status: DC | PRN
  Administered 2016-11-13: 40 mg via INTRAVENOUS

## 2016-11-13 MED ORDER — PROPOFOL 10 MG/ML IV BOLUS
INTRAVENOUS | Status: DC | PRN
  Administered 2016-11-13: 30 mg via INTRAVENOUS
  Administered 2016-11-13: 90 mg via INTRAVENOUS
  Administered 2016-11-13: 20 mg via INTRAVENOUS

## 2016-11-13 MED ORDER — LIDOCAINE 2% (20 MG/ML) 5 ML SYRINGE
INTRAMUSCULAR | Status: AC
  Filled 2016-11-13: qty 5

## 2016-11-13 MED ORDER — HYDRALAZINE HCL 20 MG/ML IJ SOLN
10.0000 mg | INTRAMUSCULAR | Status: DC | PRN
  Administered 2016-11-30: 10 mg via INTRAVENOUS
  Filled 2016-11-13 (×2): qty 1

## 2016-11-13 MED ORDER — EPHEDRINE SULFATE 50 MG/ML IJ SOLN
INTRAMUSCULAR | Status: DC | PRN
  Administered 2016-11-13: 5 mg via INTRAVENOUS

## 2016-11-13 MED ORDER — MORPHINE SULFATE 2 MG/ML IV SOLN
INTRAVENOUS | Status: DC
  Administered 2016-11-13: 4 mg via INTRAVENOUS
  Administered 2016-11-13: 18:00:00 via INTRAVENOUS
  Administered 2016-11-14 (×2): 1 mg via INTRAVENOUS
  Administered 2016-11-14: 3 mg via INTRAVENOUS
  Administered 2016-11-14: 2 mg via INTRAVENOUS
  Administered 2016-11-14: 1 mg via INTRAVENOUS
  Administered 2016-11-15: 3 mg via INTRAVENOUS
  Administered 2016-11-15: 7 mg via INTRAVENOUS
  Administered 2016-11-15 – 2016-11-16 (×2): 1 mg via INTRAVENOUS
  Administered 2016-11-16: 3 mg via INTRAVENOUS
  Administered 2016-11-16: 12:00:00 via INTRAVENOUS
  Administered 2016-11-17: 0 mg via INTRAVENOUS
  Administered 2016-11-17: 1 mg via INTRAVENOUS
  Filled 2016-11-13 (×2): qty 25
  Filled 2016-11-13: qty 30
  Filled 2016-11-13: qty 25

## 2016-11-13 MED ORDER — PHENYLEPHRINE HCL 10 MG/ML IJ SOLN
INTRAVENOUS | Status: DC | PRN
  Administered 2016-11-13: 13:00:00 via INTRAVENOUS
  Administered 2016-11-13: 25 ug/min via INTRAVENOUS

## 2016-11-13 MED ORDER — BUPIVACAINE-EPINEPHRINE (PF) 0.25% -1:200000 IJ SOLN
INTRAMUSCULAR | Status: AC
Start: 2016-11-13 — End: 2016-11-13
  Filled 2016-11-13: qty 30

## 2016-11-13 MED ORDER — PROPOFOL 10 MG/ML IV BOLUS
INTRAVENOUS | Status: AC
Start: 2016-11-13 — End: 2016-11-13
  Filled 2016-11-13: qty 40

## 2016-11-13 MED ORDER — PANTOPRAZOLE SODIUM 40 MG IV SOLR
40.0000 mg | Freq: Every day | INTRAVENOUS | Status: DC
  Administered 2016-11-13 – 2016-11-20 (×8): 40 mg via INTRAVENOUS
  Filled 2016-11-13 (×8): qty 40

## 2016-11-13 MED ORDER — EPHEDRINE 5 MG/ML INJ
INTRAVENOUS | Status: AC
  Filled 2016-11-13: qty 10

## 2016-11-13 MED ORDER — ACETAMINOPHEN 500 MG PO TABS
ORAL_TABLET | ORAL | Status: AC
  Administered 2016-11-13: 1000 mg via ORAL
  Filled 2016-11-13: qty 2

## 2016-11-13 MED ORDER — PROCHLORPERAZINE MALEATE 10 MG PO TABS: 10.0000 mg | ORAL_TABLET | Freq: Four times a day (QID) | ORAL | Status: DC | PRN

## 2016-11-13 MED ORDER — 0.9 % SODIUM CHLORIDE (POUR BTL) OPTIME
TOPICAL | Status: DC | PRN
  Administered 2016-11-13 (×3): 1000 mL

## 2016-11-13 MED ORDER — FENTANYL CITRATE (PF) 100 MCG/2ML IJ SOLN
INTRAMUSCULAR | Status: DC | PRN
  Administered 2016-11-13 (×2): 25 ug via INTRAVENOUS
  Administered 2016-11-13: 50 ug via INTRAVENOUS
  Administered 2016-11-13: 100 ug via INTRAVENOUS
  Administered 2016-11-13: 50 ug via INTRAVENOUS

## 2016-11-13 MED ORDER — BUPIVACAINE 0.5 % ON-Q PUMP DUAL CATH 300 ML
300.0000 mL | INJECTION | Status: DC
  Filled 2016-11-13: qty 300

## 2016-11-13 SURGICAL SUPPLY — 125 items
BAG BILE T-TUBES STRL (MISCELLANEOUS) ×6 IMPLANT
BIOPATCH RED 1 DISK 7.0 (GAUZE/BANDAGES/DRESSINGS) ×4 IMPLANT
BIOPATCH RED 1IN DISK 7.0MM (GAUZE/BANDAGES/DRESSINGS) ×2
BLADE CLIPPER SURG (BLADE) IMPLANT
BOOT SUTURE AID YELLOW STND (SUTURE) ×3 IMPLANT
CANISTER SUCT 3000ML PPV (MISCELLANEOUS) ×6 IMPLANT
CATH KIT ON Q 7.5IN SLV (PAIN MANAGEMENT) IMPLANT
CATH ROBINSON RED A/P 16FR (CATHETERS) IMPLANT
CHLORAPREP W/TINT 26ML (MISCELLANEOUS) ×3 IMPLANT
CLIP VESOCCLUDE LG 6/CT (CLIP) ×3 IMPLANT
CLIP VESOCCLUDE MED 24/CT (CLIP) ×3 IMPLANT
CLIP VESOLOCK LG 6/CT PURPLE (CLIP) ×12 IMPLANT
CLIP VESOLOCK MED 6/CT (CLIP) ×3 IMPLANT
CLIP VESOLOCK MED LG 6/CT (CLIP) ×3 IMPLANT
CONT SPEC 4OZ CLIKSEAL STRL BL (MISCELLANEOUS) ×3 IMPLANT
COVER BACK TABLE 60X90IN (DRAPES) ×3 IMPLANT
COVER MAYO STAND STRL (DRAPES) IMPLANT
COVER SURGICAL LIGHT HANDLE (MISCELLANEOUS) ×3 IMPLANT
DECANTER SPIKE VIAL GLASS SM (MISCELLANEOUS) ×6 IMPLANT
DERMABOND ADVANCED (GAUZE/BANDAGES/DRESSINGS)
DERMABOND ADVANCED .7 DNX12 (GAUZE/BANDAGES/DRESSINGS) IMPLANT
DRAIN CHANNEL 19F RND (DRAIN) ×6 IMPLANT
DRAIN PENROSE 1/2X36 STERILE (WOUND CARE) IMPLANT
DRAPE LAPAROSCOPIC ABDOMINAL (DRAPES) IMPLANT
DRAPE WARM FLUID 44X44 (DRAPE) ×3 IMPLANT
DRSG COVADERM 4X10 (GAUZE/BANDAGES/DRESSINGS) IMPLANT
DRSG COVADERM 4X14 (GAUZE/BANDAGES/DRESSINGS) ×3 IMPLANT
DRSG COVADERM 4X6 (GAUZE/BANDAGES/DRESSINGS) IMPLANT
DRSG COVADERM 4X8 (GAUZE/BANDAGES/DRESSINGS) IMPLANT
DRSG TEGADERM 4X4.75 (GAUZE/BANDAGES/DRESSINGS) ×3 IMPLANT
DRSG TELFA 3X8 NADH (GAUZE/BANDAGES/DRESSINGS) IMPLANT
ELECT BLADE 4.0 EZ CLEAN MEGAD (MISCELLANEOUS) ×3
ELECT BLADE 6.5 EXT (BLADE) ×3 IMPLANT
ELECT CAUTERY BLADE 6.4 (BLADE) ×3 IMPLANT
ELECT REM PT RETURN 9FT ADLT (ELECTROSURGICAL) ×3
ELECTRODE BLDE 4.0 EZ CLN MEGD (MISCELLANEOUS) ×1 IMPLANT
ELECTRODE REM PT RTRN 9FT ADLT (ELECTROSURGICAL) ×1 IMPLANT
GAUZE SPONGE 4X4 12PLY STRL (GAUZE/BANDAGES/DRESSINGS) IMPLANT
GAUZE SPONGE 4X4 16PLY XRAY LF (GAUZE/BANDAGES/DRESSINGS) ×3 IMPLANT
GEL ULTRASOUND 20GR AQUASONIC (MISCELLANEOUS) ×3 IMPLANT
GLOVE BIO SURGEON STRL SZ 6 (GLOVE) ×6 IMPLANT
GLOVE BIO SURGEON STRL SZ7 (GLOVE) ×3 IMPLANT
GLOVE BIO SURGEON STRL SZ8 (GLOVE) ×3 IMPLANT
GLOVE BIOGEL PI IND STRL 6.5 (GLOVE) ×1 IMPLANT
GLOVE BIOGEL PI IND STRL 7.5 (GLOVE) ×1 IMPLANT
GLOVE BIOGEL PI IND STRL 8 (GLOVE) ×4 IMPLANT
GLOVE BIOGEL PI IND STRL 8.5 (GLOVE) ×3 IMPLANT
GLOVE BIOGEL PI INDICATOR 6.5 (GLOVE) ×2
GLOVE BIOGEL PI INDICATOR 7.5 (GLOVE) ×2
GLOVE BIOGEL PI INDICATOR 8 (GLOVE) ×8
GLOVE BIOGEL PI INDICATOR 8.5 (GLOVE) ×6
GLOVE ECLIPSE 8.0 STRL XLNG CF (GLOVE) ×12 IMPLANT
GLOVE INDICATOR 6.5 STRL GRN (GLOVE) ×6 IMPLANT
GLOVE SURG SS PI 8.0 STRL IVOR (GLOVE) ×6 IMPLANT
GOWN STRL REUS W/ TWL LRG LVL3 (GOWN DISPOSABLE) ×2 IMPLANT
GOWN STRL REUS W/TWL 2XL LVL3 (GOWN DISPOSABLE) ×6 IMPLANT
GOWN STRL REUS W/TWL LRG LVL3 (GOWN DISPOSABLE) ×4
GOWN STRL REUS W/TWL XL LVL3 (GOWN DISPOSABLE) ×18 IMPLANT
HEMOSTAT SURGICEL 2X14 (HEMOSTASIS) IMPLANT
KIT BASIN OR (CUSTOM PROCEDURE TRAY) ×3 IMPLANT
KIT MARKER MARGIN INK (KITS) ×3 IMPLANT
KIT ROOM TURNOVER OR (KITS) ×3 IMPLANT
KIT TOURNIQUET VASCULAR (KITS) IMPLANT
L-HOOK LAP DISP 36CM (ELECTROSURGICAL)
LHOOK LAP DISP 36CM (ELECTROSURGICAL) IMPLANT
LOOP VESSEL MAXI BLUE (MISCELLANEOUS) ×3 IMPLANT
LOOP VESSEL MINI RED (MISCELLANEOUS) ×3 IMPLANT
NEEDLE BIOPSY 14X6 SOFT TISS (NEEDLE) IMPLANT
NS IRRIG 1000ML POUR BTL (IV SOLUTION) ×9 IMPLANT
PACK GENERAL/GYN (CUSTOM PROCEDURE TRAY) ×3 IMPLANT
PAD ARMBOARD 7.5X6 YLW CONV (MISCELLANEOUS) ×6 IMPLANT
PAD SHARPS MAGNETIC DISPOSAL (MISCELLANEOUS) ×3 IMPLANT
PENCIL BUTTON HOLSTER BLD 10FT (ELECTRODE) IMPLANT
PLUG CATH AND CAP STER (CATHETERS) IMPLANT
RELOAD PROXIMATE 75MM BLUE (ENDOMECHANICALS) ×6 IMPLANT
RELOAD PROXIMATE 75MM GREEN (ENDOMECHANICALS) IMPLANT
RELOAD STAPLER LINE PROX 30 GR (STAPLE) ×1 IMPLANT
SCISSORS LAP 5X35 DISP (ENDOMECHANICALS) IMPLANT
SEPRAFILM PROCEDURAL PACK 3X5 (MISCELLANEOUS) IMPLANT
SET IRRIG TUBING LAPAROSCOPIC (IRRIGATION / IRRIGATOR) IMPLANT
SHEARS FOC LG CVD HARMONIC 17C (MISCELLANEOUS) ×3 IMPLANT
SLEEVE ENDOPATH XCEL 5M (ENDOMECHANICALS) IMPLANT
SPONGE INTESTINAL PEANUT (DISPOSABLE) IMPLANT
SPONGE LAP 18X18 X RAY DECT (DISPOSABLE) ×21 IMPLANT
SPONGE SURGIFOAM ABS GEL 100 (HEMOSTASIS) IMPLANT
STAPLER PROXIMATE 75MM BLUE (STAPLE) ×6 IMPLANT
STAPLER RELOAD LINE PROX 30 GR (STAPLE) ×3
STAPLER VISISTAT 35W (STAPLE) ×3 IMPLANT
SUCTION POOLE TIP (SUCTIONS) ×3 IMPLANT
SUT 5.0 PDS RB-1 (SUTURE) ×10
SUT ETHILON 2 0 FS 18 (SUTURE) ×12 IMPLANT
SUT ETHILON 2 LR (SUTURE) IMPLANT
SUT MNCRL AB 4-0 PS2 18 (SUTURE) ×3 IMPLANT
SUT PDS AB 1 TP1 96 (SUTURE) ×9 IMPLANT
SUT PDS AB 3-0 SH 27 (SUTURE) ×12 IMPLANT
SUT PDS AB 4-0 RB1 27 (SUTURE) ×42 IMPLANT
SUT PDS PLUS AB 5-0 RB-1 (SUTURE) ×5 IMPLANT
SUT PROLENE 3 0 SH 48 (SUTURE) ×12 IMPLANT
SUT PROLENE 4 0 RB 1 (SUTURE) ×4
SUT PROLENE 4-0 RB1 .5 CRCL 36 (SUTURE) ×2 IMPLANT
SUT PROLENE 5 0 RB 1 DA (SUTURE) ×6 IMPLANT
SUT SILK 2 0 SH CR/8 (SUTURE) ×6 IMPLANT
SUT SILK 2 0 TIES 10X30 (SUTURE) ×3 IMPLANT
SUT SILK 3 0 SH CR/8 (SUTURE) ×6 IMPLANT
SUT SILK 3 0 TIES 10X30 (SUTURE) ×3 IMPLANT
SUT VIC AB 2-0 CT1 27 (SUTURE)
SUT VIC AB 2-0 CT1 TAPERPNT 27 (SUTURE) IMPLANT
SUT VIC AB 2-0 SH 18 (SUTURE) ×3 IMPLANT
SUT VIC AB 3-0 SH 18 (SUTURE) ×3 IMPLANT
SUT VIC AB 3-0 SH 27 (SUTURE) ×2
SUT VIC AB 3-0 SH 27X BRD (SUTURE) ×1 IMPLANT
SUT VICRYL AB 2 0 TIES (SUTURE) IMPLANT
TAPE UMBILICAL 1/8 X36 TWILL (MISCELLANEOUS) IMPLANT
TOWEL OR 17X24 6PK STRL BLUE (TOWEL DISPOSABLE) ×6 IMPLANT
TOWEL OR 17X26 10 PK STRL BLUE (TOWEL DISPOSABLE) ×3 IMPLANT
TRAY FOLEY W/METER SILVER 14FR (SET/KITS/TRAYS/PACK) ×3 IMPLANT
TRAY LAPAROSCOPIC MC (CUSTOM PROCEDURE TRAY) ×3 IMPLANT
TROCAR XCEL BLUNT TIP 100MML (ENDOMECHANICALS) ×3 IMPLANT
TROCAR XCEL NON-BLD 11X100MML (ENDOMECHANICALS) IMPLANT
TROCAR XCEL NON-BLD 5MMX100MML (ENDOMECHANICALS) ×3 IMPLANT
TUBE FEEDING 8FR 16IN STR KANG (MISCELLANEOUS) IMPLANT
TUBE FEEDING ENTERAL 5FR 16IN (TUBING) ×3 IMPLANT
TUBING INSUFFLATION (TUBING) ×3 IMPLANT
TUNNELER SHEATH ON-Q 16GX12 DP (PAIN MANAGEMENT) IMPLANT
WATER STERILE IRR 1000ML POUR (IV SOLUTION) IMPLANT

## 2016-11-13 NOTE — Op Note (Signed)
PREOPERATIVE DIAGNOSIS: cT2N0M0 adenocarcinoma of the pancreatic head.  POSTOPERATIVE DIAGNOSIS: Same.   PROCEDURES PERFORMED:  Diagnostic laparoscopy  Classic pancreaticoduodenectomy   Placement of pancreatic stent  16 Fr Jejunostomy tube  SURGEON: [name], MD   ASSISTANT: Kaylyn Lim, MD and Verita Lamb, MD   ANESTHESIA: General and epidural   FINDINGS: 2.5 cm pancreatic head mass. Firm pancreatic tissue. 7 mm common bile duct. 3-4 mm pancreatic duct  SPECIMENS:  1. Pancreaticoduodenectomy with gallbladder:  2.  Additional pancreatic margin  ESTIMATED BLOOD LOSS: 300 mL.   COMPLICATIONS: None known.   PROCEDURE:   Pt was identified in the holding area and taken to  the operating room, and placed supine on the operating room  table. General anesthesia was induced. The patient's abdomen was  prepped and draped in a sterile fashion, after a Foley catheter was  placed. A time-out was performed according to the surgical safety check  list. When all was correct we continued.   A small vertical incision was made in the prior midline incision just above the umbilicus. The fascia was elevated and opened.  A pursestring suture was placed around the fascial incision.  The Hasson was placed into the abdominal cavity and held in place with the tails of the suture. The abdomen was insufflated with carbon dioxide.  Due to the significant adhesions, a good view of the abdomen was not able to be obtained.    A midline incision was made from the xiphoid to just below the umbilicus. The subcutaneous tissues were divided with the Bovie cautery. The peritoneum was entered in the center of the abdomen. Digital retraction was then used to elevate the preperitoneal fat, and  this was taken with the cautery as well. Care was taken to protect the underlying viscera. There were many adhesions due to prior surgery.  These were divided with scissors and with cautery when appropriate.    The  Bookwalter self-retaining retractor was placed to assist with visualization. The stomach and duodenum were adherent to the liver.  Sharp dissection was used to take down these adhesions. A small gastric defect was closed in two layers.   The gallbladder was surgically absent.  The porta was identified. The duodenum was kocherized extensively with blunt dissection and with cautery.   The common bile duct was skeletonized near the duodenum. A vessel loop was passed around it. The gastroduodenal artery, as well as the common hepatic artery were skeletonized. The proper hepatic artery was traced out to make sure that flow was going to both sides of the liver when the GDA was clamped. The GDA was test clamped with the bulldog, with good flow to the liver and  no signs of ischemia. This was divided with 2-0 silk ties and then clipped. The proper hepatic artery was reflected upward, and the anterior portal vein was exposed.  A Kelly clamp was passed underneath the pancreas at the superior mesenteric vein, and this passed with no signs of tumor involvement.   Attention was then directed to the stomach, and the omentum was taken  off of the stomach at the border of the antrum and the body. The  gastrohepatic ligament was taken down with the harmonic, and care was  taken to make sure there was not a replaced left hepatic artery in this  location. The stomach was divided with the GIA-75 stapler. The border  of the stomach was oversewn with a 3-0 running PDS suture.   Attention was then directed to  the small bowel. Around 10 cm past the  ligament of Treitz it was located, and this was divided with the 75-GIA.  The distal portion of the jejunum was also oversewn with a 3-0 PDS  suture. The fourth portion of the duodenum was skeletonized with the  harmonic scalpel, taking down all of the mesenteric vessels. The  ligament of Treitz was taken down. The IMV was preserved.  The duodenum was then passed underneath  the portal vein.   At this point the Claiborne Billings was replaced and the pancreas was divided with the cautery. 2-0 silk sutures were tied down and the inferior and superior border of the pancreas. The Bovie was used to coagulate the small bleeders at the border of the pancreas.  The Overholt in combination with the harmonic and locking Weck clips  were then used to take the uncinate process off of the portal vein and  the superior mesenteric artery. Care was taken not to incorporate the  superior mesenteric artery in the dissection. The specimen was then marked and passed off the table for frozen section margin.   The jejunum was then passed underneath  the SMV, in order to get appropriate lie for the pancreatic and biliary  anastomoses. The more distal portion of the jejunum was pulled up over  the colon, and two 3-0 silks were placed through the posterior border  of the stomach for the gastrojejunostomy. The stomach and the small  bowel were opened, and a GIA-75 was used to create an end-to-end  anastomosis. The open areas of the staple line were examined to ensure  that there was hemostasis. The defect was then closed with a single  layer of running Connell suture of 3-0 PDS. Prior to a complete  closure, the NG tube was passed toward the afferent limb.   The appropriate location for the choledochojejunostomy was identified, and  the small bowel was opened approximately 10 mm. The anastamosis was created with approximately thirteen 4-0 interrupted PDS sutures.   The 2 corner sutures were placed first  and then the posterior layer was done in an interrupted fashion tying on  the inside. The superior layer was then closed with interrupted sutures as  well.   At this point the frozens returned back as focally positive on the pancreas. Additional pancreatic tissue was taken.  The pancreatic  anastomosis was then created by opening the jejunum the length  of the pancreatic parenchyma. The pancreas  was firm, and the duct was 3-4 mm. A pediatric feeding tube was used as a pancreatic stent. The posterior layer was formed first with 2-0 silk sutures in interrupted fashion. Five 5-0 PDS sutures were used to create the duct-to-mucosa anastamosis. The anterior layer was then oversewn with 2-0 silks to dunk the pancreatic parenchyma.   A feeding jejunostomy was place in the left abdomen.  A 2-0 silk pursestring suture was placed in the bowel around 20 cm beyond the gastrojejunostomy.  The feeding tube was advanced through the abdominal wall.  The bowel was opened and the tube threaded through the bowel.  The pursestring suture was tied down and the tube was Witzeled with three additional 3-0 silk sutures.  The tube was then pexed to the abdominal wall with 2-0 silk sutures.   The areas were then irrigated and then those anastomoses were covered  with Evicel. This was allowed to dry.  The abdomen was then irrigated  again and all the laparotomy sponges were removed. A lap count was  performed, which was correct. Two 19-Blake drains were placed, with the  lateral-most drain placed behind the choledochojejunostomy. The medial  Blake drain was placed just anterior and slightly superior to the  pancreaticojejunostomy.  The fascia was then closed with #1 looped running PDS sutures. The skin was irrigated and then closed with  staples. The wounds were cleaned, dried and dressed with a sterile  dressing.   The patient tolerated the procedure well and was extubated and taken to  PACU in stable condition. Needle and sponge counts were correct x2.

## 2016-11-13 NOTE — Progress Notes (Signed)
Inpatient Diabetes Program Recommendations  AACE/ADA: New Consensus Statement on Inpatient Glycemic Control (2015)  Target Ranges:  Prepandial:   less than 140 mg/dL      Peak postprandial:   less than 180 mg/dL (1-2 hours)      Critically ill patients:  140 - 180 mg/dL   Lab Results  Component Value Date   GLUCAP 82 09/22/2015    Review of Glycemic Control  Results for NAAVYA, POSTMA (MRN [id number]) as of 11/13/2016 10:19  Ref. Range 11/13/2016 06:35  Glucose Latest Ref Range: 65 - 99 mg/dL 249 (H)    Diabetes history: none noted Outpatient Diabetes medications: none Current orders for Inpatient glycemic control: none  Inpatient Diabetes Program Recommendations:  Fasting lab glucose 249mg /dl this am. Consider ordering CBG tid and hs with Novolog 0-9 units tid.  Gentry Fitz, RN, BA, MHA, CDE Diabetes Coordinator Inpatient Diabetes Program  [phone] (Team Pager) [phone] ([place]) 11/13/2016 10:20 AM

## 2016-11-13 NOTE — Anesthesia Procedure Notes (Signed)
Arterial Line Insertion Start/End8/12/2016 7:10 AM Performed by: Josephine Igo, MERCER, KARI L, CRNA  Patient location: PACU. Preanesthetic checklist: patient identified, IV checked, site marked, risks and benefits discussed, surgical consent, monitors and equipment checked, pre-op evaluation, timeout performed and anesthesia consent Lidocaine 1% used for infiltration Right, radial was placed Catheter size: 20 G Hand hygiene performed  and maximum sterile barriers used  Allen's test indicative of satisfactory collateral circulation Attempts: 1 Procedure performed without using ultrasound guided technique. Following insertion, dressing applied and Biopatch. Post procedure assessment: normal  Patient tolerated the procedure well with no immediate complications.

## 2016-11-13 NOTE — Transfer of Care (Signed)
Immediate Anesthesia Transfer of Care Note  Patient: Rhonda Spencer  Procedure(s) Performed: Procedure(s): ATTEMPTED DAGNOSTIC LAPAROSCOPY (N/A) WHIPPLE PROCEDURE (N/A) PLACEMENT JEJUNOSTOMY TUBE (N/A)  Patient Location: PACU  Anesthesia Type:General  Level of Consciousness: awake, patient cooperative and responds to stimulation  Airway & Oxygen Therapy: Patient Spontanous Breathing and Patient connected to face mask oxygen  Post-op Assessment: Report given to RN, Post -op Vital signs reviewed and stable and Patient moving all extremities X 4  Post vital signs: Reviewed and stable  Last Vitals:  Vitals:   11/13/16 0744 11/13/16 1430  BP:  (!) 99/51  Pulse: 62 (!) 57  Resp: 13 (!) 24  Temp:  (!) 36.2 C  SpO2: 100% 100%    Last Pain:  Vitals:   11/13/16 0631  TempSrc:   PainSc: 5          Complications: No apparent anesthesia complications

## 2016-11-13 NOTE — H&P (View-Only) (Signed)
MYLDRED RAJU 10/28/2016 11:32 AM Location: [facility] Patient #: [id number] DOB: [birth year] Single / Language: Cleophus Molt / Race: Black or African American Female  History of Present Illness [name] MD; 10/28/2016 1:06 PM) The patient is a 76 year old female who presents with pancreatic cancer. Patient is a 76 year old female referred by Dr. 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 She presented with weight loss, fatigue, diarrhea, and jaundice to Dr. Collene Mares. She had lost around 20 pounds and LFTs demonstrated a pattern of obstructive jaundice. She underwent imaging which showed a pancreatic head mass and a double duct sign. Dr. Benson Norway performed an ERCP and endoscopic ultrasound which confirmed these findings. Cytology was positive for malignancy. She is feeling dramatically better since her stenting. She is no longer having dark urine. She is not having any more diarrhea. Her appetite has continued to improve. She does have some back pain. She uses a cane to ambulate, but goes to the [employer] 2-3 times a week to exercise. She plays cards at the senior center multiple times a week as well. She is taking Ensure several times a week, but not every day. Her appetite is picking up dramatically.  Diagnosis FINE NEEDLE ASPIRATION, ENDOSCOPIC HEAD OF PANCREAS (SPECIMEN 1 OF 1, COLLECTED 10/03/16): MALIGNANT CELLS PRESENT, CONSISTENT WITH ADENOCARCINOMA.  EUS 10/03/16 A round mass was identified in the pancreatic head. The mass was hypoechoic. The mass measured 24 mm by 19 mm in maximal cross-sectional diameter. The endosonographic borders were poorly-defined. An intact interface was seen between the mass and the celiac trunk, portal vein and duodenum suggesting a lack of invasion. The remainder of the pancreas was examined. The endosonographic appearance of parenchyma and the upstream pancreatic duct indicated duct dilation, a maximum duct diameter of 7  mm and parenchymal atrophy. Fine needle aspiration for cytology was performed. Color Doppler imaging was utilized prior to needle puncture to confirm a lack of significant vascular structures within the needle path. Five passes were made with the 25 gauge needle using a transduodenal approach. A stylet was used. A cytotechnologist was present to evaluate the adequacy of the specimen. The cellularity of the specimen was adequate. Final cytology results are pending. It is likely that the malignancy is larger than measured. The lesion was indistinct, but with the FNA there was a course sensation with passage of the needle. Endoscopically there appears to be some minor extrinsic compression into the duodenal lumen. The CBD was markedly dilated and measured to be 17-18 mm at its greatest diameter.  ERCP 10/03/2016 The major papilla was normal. The bile duct was easily and deeply cannulated with the short-nosed traction sphincterotome with the initial attempt. Contrast was injected. I personally interpreted the bile duct images. There was brisk flow of contrast through the ducts. Image quality was excellent. Contrast extended to the bifurcation. The common bile duct was diffusely dilated, with a mass causing an obstruction. The largest diameter was 18 mm. The lower third of the main bile duct contained a single localized stenosis 20 mm in length. A short 0.035 inch Soft Jagwire was passed into the biliary tree. A 10 mm biliary sphincterotomy was made with a monofilament traction (standard) sphincterotome using ERBE electrocautery. The sphincterotomy oozed blood. One 10 mm by 4 cm covered metal stent was placed 2 cm into the common bile duct. Clear fluid flowed through the stent. The stent was in good position.  Ct Chest Wo Contrast  Result Date:  10/21/2016 CLINICAL DATA: New diagnosis of pancreatic head malignancy. Chest staging. Remote smoking history. EXAM: CT CHEST WITHOUT CONTRAST TECHNIQUE:  Multidetector CT imaging of the chest was performed following the standard protocol without IV contrast. COMPARISON: 09/24/2016 CT abdomen/ pelvis. 06/06/2011 chest CT. FINDINGS: Mildly motion degraded scan. Cardiovascular: Normal heart size. No significant pericardial fluid/thickening. Left anterior descending coronary atherosclerosis. Atherosclerotic thoracic aorta with ectatic 4.2 cm ascending thoracic aorta, stable. 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 Normal caliber pulmonary arteries . Mediastinum/Nodes: Stable subcentimeter hyperdense left thyroid lobe nodule. Unremarkable esophagus. No pathologically enlarged axillary, mediastinal or gross hilar lymph nodes, noting limited sensitivity for the detection of hilar adenopathy on this noncontrast study. Lungs/Pleura: No pneumothorax. No pleural effusion. Stable scattered tiny calcified granulomas in the right lung. Peripheral apical subpleural right upper lobe 3 mm nodule (series 7/ image 12) is stable since 06/06/2011 and considered benign. No new significant pulmonary nodules. No lung masses. Mild patchy consolidation in the peribronchovascular and peripheral basilar left lower lobe appears slightly increased since 09/24/2016 CT abdomen study. Patchy ground-glass attenuation in both lower lobes also appears mildly increased. Upper abdomen: Cholecystectomy. Partially visualized common bile duct stent. Pneumobilia throughout the intrahepatic bile ducts and visualized common bile duct, compatible with recent ERCP and stent placement. Two large simple liver cysts, largest 6.8 cm in the superior liver. Stable diffuse main pancreatic duct dilation. Musculoskeletal: No aggressive appearing focal osseous lesions. Marked thoracic spondylosis. IMPRESSION: 1. No specific findings of metastatic disease in the chest. 2.  Mild patchy consolidation in the basilar left lower lobe and patchy ground-glass attenuation at both lung bases, mildly increased since 09/24/2016 CT abdomen study, most suggestive of a mild nonspecific infectious or inflammatory bronchopneumonia. Recommend attention on follow-up post treatment chest CT in 3 months. 3. Pneumobilia and common bile duct stent placement in the visualized upper abdomen. 4. Stable ectatic 4.2 cm ascending thoracic aorta. Recommend annual imaging followup by CTA or MRA. This recommendation follows 2010 ACCF/AHA/AATS/ACR/ASA/SCA/SCAI/SIR/STS/SVM Guidelines for the Diagnosis and Management of Patients with Thoracic Aortic Disease. Circulation. 2010; 121: N053-Z767. 5. Aberrant right subclavian artery. 6. One vessel coronary atherosclerosis. Aortic Atherosclerosis (ICD10-I70.0). Aortic aneurysm NOS (ICD10-I71.9). These results will be called to the ordering clinician or representative by the Radiologist Assistant, and communication documented in the PACS or zVision Dashboard. Electronically Signed By: Ilona Sorrel M.D. On: 10/21/2016 14:46  Dg Ercp Biliary & Pancreatic Ducts  Result Date: 10/03/2016 CLINICAL DATA: ERCP with stent placement EXAM: ERCP TECHNIQUE: Multiple spot images obtained with the fluoroscopic device and submitted for interpretation post-procedure. FLUOROSCOPY TIME: 1 minute, 42 seconds (9.98 mGy) COMPARISON: CT abdomen and pelvis -09/24/2016 FINDINGS: Two spot intraoperative fluoroscopic images of the right upper abdominal quadrant during ERCP are provided for review Initial image demonstrates an ERCP probe overlying the right upper abdominal quadrant. Post cholecystectomy. There is selective cannulation and opacification of the distal aspect of the common bile duct. The common bile duct appears markedly dilated with abrupt tapering distally, similar to preceding abdominal CT. There is no definitive opacification of the intrahepatic biliary system, residual  cystic duct or the pancreatic duct. Completion image demonstrates placement of an internal biliary stent overlying the distal aspect of the CBD with pneumobilia within the central common bile duct. IMPRESSION: ERCP with biliary stent placement as above. These images were submitted for radiologic interpretation only. Please see the procedural report  for the amount of contrast and the fluoroscopy time utilized. Electronically Signed By: Sandi Mariscal M.D. On: 10/03/2016 10:08   Recent Results (from the past 2160 hour(s)) CBC with Differential Status: Abnormal Collection Time: 10/14/16 9:05 AM Result Value Ref Range WBC 5.0 3.9 - 10.3 10e3/uL NEUT# 2.3 1.5 - 6.5 10e3/uL HGB 11.2 (L) 11.6 - 15.9 g/dL HCT 34.3 (L) 34.8 - 46.6 % Platelets 280 145 - 400 10e3/uL MCV 89.8 79.5 - 101.0 fL MCH 29.3 25.1 - 34.0 pg MCHC 32.7 31.5 - 36.0 g/dL RBC 3.82 3.70 - 5.45 10e6/uL RDW 14.4 11.2 - 14.5 % lymph# 1.9 0.9 - 3.3 10e3/uL MONO# 0.6 0.1 - 0.9 10e3/uL Eosinophils Absolute 0.1 0.0 - 0.5 10e3/uL Basophils Absolute 0.0 0.0 - 0.1 10e3/uL NEUT% 46.7 38.4 - 76.8 % LYMPH% 37.3 14.0 - 49.7 % MONO% 12.6 0.0 - 14.0 % EOS% 2.8 0.0 - 7.0 % BASO% 0.6 0.0 - 2.0 % Comprehensive metabolic panel Status: Abnormal Collection Time: 10/14/16 9:05 AM Result Value Ref Range Sodium 140 136 - 145 mEq/L Potassium 3.8 3.5 - 5.1 mEq/L Chloride 107 98 - 109 mEq/L CO2 24 22 - 29 mEq/L Glucose 180 (H) 70 - 140 mg/dl Comment: Glucose reference range is for nonfasting patients. Fasting glucose reference range is 70- 100. BUN 11.1 7.0 - 26.0 mg/dL Creatinine 0.8 0.6 - 1.1 mg/dL Total Bilirubin 6.11 (HH) 0.20 - 1.20 mg/dL Alkaline Phosphatase 314 (H) 40 - 150 U/L AST 81 (H) 5 - 34 U/L ALT 117 (H) 0 - 55 U/L Total Protein 7.4 6.4 - 8.3 g/dL Albumin 3.2 (L) 3.5 - 5.0 g/dL Calcium 9.6 8.4 - 10.4 mg/dL Anion Gap 9 3 - 11 mEq/L EGFR 81 (L) >90 ml/min/1.73 m2 Comment: eGFR is calculated using the CKD-EPI Creatinine  Equation (2009) CA 19.9 Status: Abnormal Collection Time: 10/14/16 9:05 AM Result Value Ref Range CA 19-9 693 (H) 0 - 35 U/mL Comment: Roche ECLIA methodology         Past Surgical History Malachy Moan, RMA; 10/28/2016 11:33 AM) Cataract Surgery Bilateral. Hemorrhoidectomy Hysterectomy (not due to cancer) - Complete Oral Surgery Spinal Surgery - Lower Back Tonsillectomy  Diagnostic Studies History Malachy Moan, RMA; 10/28/2016 11:33 AM) Colonoscopy 1-5 years ago Mammogram within last year Pap Smear >5 years ago  Allergies Malachy Moan, RMA; 10/28/2016 11:34 AM) Atorvastatin Calcium *ANTIHYPERLIPIDEMICS* Iodinated Diagnostic Agents Trandolapril-Verapamil HCl ER *ANTIHYPERTENSIVES*  Medication History Malachy Moan, RMA; 10/28/2016 11:35 AM) Lidocaine (5% Patch, External) Active. Felodipine ER (5MG Tablet ER 24HR, Oral) Active. Betamethasone Dipropionate (0.05% Cream, External) Active. Aspirin (81MG Tablet, Oral) Active. Famotidine (20MG Tablet, Oral) Active. Ketotifen Fumarate (0.025% Solution, Ophthalmic) Active. Magnesium (250MG Tablet, Oral) Active. Medications Reconciled  Social History Malachy Moan, [place]; 10/28/2016 11:33 AM) Alcohol use Moderate alcohol use. Caffeine use Carbonated beverages, Coffee, Tea. No drug use Tobacco use Former smoker.  Family History Malachy Moan, [place]; 10/28/2016 11:33 AM) Diabetes Mellitus Daughter, Sister, Son. Heart Disease Mother. Heart disease in female family member before age 64 Hypertension Mother.  Pregnancy / Birth History Malachy Moan, [place]; 10/28/2016 11:33 AM) Age at menarche 46 years. Age of menopause <43 Gravida 3 Length (months) of breastfeeding 3-6 Maternal age 14-30 Para 2  Other Problems Malachy Moan, [place]; 10/28/2016 11:33 AM) Arthritis Cholelithiasis Gastroesophageal Reflux Disease High blood pressure Oophorectomy  Bilateral. Pancreatic Cancer     Review of Systems Malachy Moan RMA; 10/28/2016 11:33 AM) General Present- Appetite Loss, Fatigue and Weight Loss. Not Present- Chills, Fever, Night Sweats and Weight Gain. HEENT Present-  Yellow Eyes. Not Present- Earache, Hearing Loss, Hoarseness, Nose Bleed, Oral Ulcers, Ringing in the Ears, Seasonal Allergies, Sinus Pain, Sore Throat, Visual Disturbances and Wears glasses/contact lenses. Respiratory Not Present- Bloody sputum, Chronic Cough, Difficulty Breathing, Snoring and Wheezing. Breast Not Present- Breast Mass, Breast Pain, Nipple Discharge and Skin Changes. Gastrointestinal Present- Change in Bowel Habits and Gets full quickly at meals. Not Present- Abdominal Pain, Bloating, Bloody Stool, Chronic diarrhea, Constipation, Difficulty Swallowing, Excessive gas, Hemorrhoids, Indigestion, Nausea, Rectal Pain and Vomiting. Musculoskeletal Present- Back Pain. Not Present- Joint Pain, Joint Stiffness, Muscle Pain, Muscle Weakness and Swelling of Extremities.  Vitals Malachy Moan RMA; 10/28/2016 11:36 AM) 10/28/2016 11:35 AM Weight: 130.6 lb Height: 66in Body Surface Area: 1.67 m Body Mass Index: 21.08 kg/m  Temp.: 97.68F  Pulse: 62 (Regular)  BP: 136/70 (Sitting, Left Arm, Standard)      Physical Exam [name] MD; 10/28/2016 1:06 PM)  General Mental Status-Alert. General Appearance-Consistent with stated age. Hydration-Well hydrated. Voice-Normal.  Head and Neck Head-normocephalic, atraumatic with no lesions or palpable masses. Trachea-midline. Thyroid Gland Characteristics - normal size and consistency.  Eye Eyeball - Bilateral-Extraocular movements intact. Sclera/Conjunctiva - Bilateral-No scleral icterus.  Chest and Lung Exam Chest and lung exam reveals -quiet, even and easy respiratory effort with no use of accessory muscles and on auscultation, normal breath sounds, no adventitious sounds and  normal vocal resonance. Inspection Chest Wall - Normal. Back - normal.  Cardiovascular Cardiovascular examination reveals -normal heart sounds, regular rate and rhythm with no murmurs and normal pedal pulses bilaterally.  Abdomen Inspection Inspection of the abdomen reveals - No Hernias. Palpation/Percussion Palpation and Percussion of the abdomen reveal - Soft, Non Tender, No Rebound tenderness, No Rigidity (guarding) and No hepatosplenomegaly. Auscultation Auscultation of the abdomen reveals - Bowel sounds normal. Note: midline incision and RUQ incision.  Neurologic Neurologic evaluation reveals -alert and oriented x 3 with no impairment of recent or remote memory. Mental Status-Normal.  Musculoskeletal Global Assessment -Note:no gross deformities.  Normal Exam - Left-Upper Extremity Strength Normal and Lower Extremity Strength Normal. Normal Exam - Right-Upper Extremity Strength Normal and Lower Extremity Strength Normal.  Lymphatic Head & Neck  General Head & Neck Lymphatics: Bilateral - Description - Normal. Axillary  General Axillary Region: Bilateral - Description - Normal. Tenderness - Non Tender. Femoral & Inguinal  Generalized Femoral & Inguinal Lymphatics: Bilateral - Description - No Generalized lymphadenopathy.    Assessment & Plan [name] MD; 10/28/2016 1:12 PM)  ADENOCARCINOMA OF HEAD OF PANCREAS (C25.0) Impression: Patient has a new diagnosis of adenocarcinoma of the pancreatic head. This is a T2 N0 clinical stage. Staging workup is negative for metastatic disease. She does have a elevated CA 19-9, but this was drawn prior to resolution of her jaundice. She has discussed the pros and cons of neoadjuvant chemotherapy with Dr. Burr Medico. She would like to pursue the most effective treatment that she is a candidate for. I discussed with her, her daughter, and her son the risks of surgery including the likelihood of prolonged recovery. She is  moving in with her daughter. I discussed the surgery with the patient including diagrams of anatomy. I discussed the potential for diagnostic laparoscopy. In the case of pancreatic cancer, if spread of the disease is found, we will abort the procedure and not proceed with resection. The rationale for this was discussed with the patient. There has not been data to support resection of Stage IV disease in terms of survival benefit.  We discussed possible complications  including: Potential of aborting procedure if tumor is invading the superior mesenteric or hepatic arteries Bleeding Infection and possible wound complications such as hernia Damage to adjacent structures Leak of anastamoses, primarily pancreatic Possible need for other procedures Possible prolonged nausea with possible need for external feeding. Possible prolonged hospital stay. Possible development of diabetes or worsening of current diabetes. Possible pancreatic exocrine insufficiency Prolonged fatigue/weakness/appetite Possible early recurrence of cancer   The patient understands and wishes to proceed. The patient has been advised to turn in disability paperwork to our office.  Current Plans Pt Education - [facility] pt info Referred to Physical Therapy, for evaluation and follow up (Physical Therapy). Routine. 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 I advised her to reschedule her nutrition appointment. I am going to send her to preoperative physical therapy to maximize her strength prior to surgery.

## 2016-11-13 NOTE — Anesthesia Procedure Notes (Signed)
Epidural Patient location during procedure: holding area Start time: 11/13/2016 7:22 AM End time: 11/13/2016 7:32 AM  Staffing Anesthesiologist: Josephine Igo Performed: anesthesiologist   Preanesthetic Checklist Completed: patient identified, site marked, surgical consent, pre-op evaluation, timeout performed, IV checked, risks and benefits discussed and monitors and equipment checked  Epidural Patient position: sitting Prep: DuraPrep Patient monitoring: continuous pulse ox, cardiac monitor, blood pressure and heart rate Approach: midline Location: thoracic (1-12) (T7-8) Injection technique: LOR air  Needle:  Needle type: Tuohy  Needle gauge: 17 G Needle length: 9 cm Needle insertion depth: 7 cm Catheter at skin depth: 11 cm  Additional Notes Patient tolerated procedure well. Will start epidural infusion in PACUReason for block:post-op pain management

## 2016-11-13 NOTE — Anesthesia Postprocedure Evaluation (Addendum)
Anesthesia Post Note  Patient: Rhonda Spencer  Procedure(s) Performed: Procedure(s) (LRB): ATTEMPTED DAGNOSTIC LAPAROSCOPY (N/A) WHIPPLE PROCEDURE (N/A) PLACEMENT JEJUNOSTOMY TUBE (N/A)     Patient location during evaluation: PACU Anesthesia Type: General Level of consciousness: awake, lethargic and patient cooperative Pain management: pain level controlled Vital Signs Assessment: post-procedure vital signs reviewed and stable Respiratory status: spontaneous breathing, nonlabored ventilation, respiratory function stable and patient connected to face mask oxygen Cardiovascular status: blood pressure returned to baseline and stable Postop Assessment: no signs of nausea or vomiting Anesthetic complications: no Comments: Repeat H/H in PACU 7.2/21.4  Will give 1 unit of PRBC, may possibly need 2 units transfused.    Last Vitals:  Vitals:   11/13/16 1621 11/13/16 1630  BP:  (!) 81/48  Pulse:  (!) 51  Resp: (!) 22 17  Temp:  (!) 36.4 C  SpO2: 100% 100%    Last Pain:  Vitals:   11/13/16 1630  TempSrc: Temporal  PainSc:                  [place] Chelf A.

## 2016-11-13 NOTE — Interval H&P Note (Signed)
History and Physical Interval Note:  11/13/2016 7:38 AM  Rhonda Spencer  has presented today for surgery, with the diagnosis of pancreatic cancer  The various methods of treatment have been discussed with the patient and family. After consideration of risks, benefits and other options for treatment, the patient has consented to  Procedure(s) with comments: LAPAROSCOPY DIAGNOSTIC (N/A) - EPIDURAL WHIPPLE PROCEDURE (N/A) JEJUNOSTOMY (N/A) as a surgical intervention .  The patient's history has been reviewed, patient examined, no change in status, stable for surgery.  I have reviewed the patient's chart and labs.  Questions were answered to the patient's satisfaction.     [name]

## 2016-11-13 NOTE — Anesthesia Procedure Notes (Signed)
Procedure Name: Intubation Date/Time: 11/13/2016 8:00 AM Performed by: Tressia Miners LEFFEW Pre-anesthesia Checklist: Patient identified, Patient being monitored, Timeout performed, Emergency Drugs available and Suction available Patient Re-evaluated:Patient Re-evaluated prior to induction Oxygen Delivery Method: Circle System Utilized Preoxygenation: Pre-oxygenation with 100% oxygen Induction Type: IV induction Ventilation: Mask ventilation without difficulty Laryngoscope Size: Mac and 3 Grade View: Grade I Tube type: Oral Tube size: 7.5 mm Number of attempts: 1 Airway Equipment and Method: Stylet Placement Confirmation: ETT inserted through vocal cords under direct vision,  positive ETCO2 and breath sounds checked- equal and bilateral Secured at: 21 cm Tube secured with: Tape Dental Injury: Teeth and Oropharynx as per pre-operative assessment

## 2016-11-14 ENCOUNTER — Encounter (HOSPITAL_COMMUNITY): Payer: Self-pay | Admitting: General Surgery

## 2016-11-14 ENCOUNTER — Encounter (HOSPITAL_COMMUNITY): Payer: Self-pay | Admitting: Anesthesiology

## 2016-11-14 ENCOUNTER — Other Ambulatory Visit: Payer: Self-pay

## 2016-11-14 LAB — COMPREHENSIVE METABOLIC PANEL
ALT: 55 U/L — ABNORMAL HIGH (ref 14–54)
ALT: 57 U/L — ABNORMAL HIGH (ref 14–54)
ANION GAP: 7 (ref 5–15)
ANION GAP: 9 (ref 5–15)
AST: 114 U/L — ABNORMAL HIGH (ref 15–41)
AST: 109 U/L — ABNORMAL HIGH (ref 15–41)
Albumin: 2.5 g/dL — ABNORMAL LOW (ref 3.5–5.0)
Albumin: 2.9 g/dL — ABNORMAL LOW (ref 3.5–5.0)
Alkaline Phosphatase: 59 U/L (ref 38–126)
Alkaline Phosphatase: 68 U/L (ref 38–126)
BUN: 12 mg/dL (ref 6–20)
BUN: 12 mg/dL (ref 6–20)
CHLORIDE: 105 mmol/L (ref 101–111)
CHLORIDE: 108 mmol/L (ref 101–111)
CO2: 17 mmol/L — ABNORMAL LOW (ref 22–32)
CO2: 17 mmol/L — AB (ref 22–32)
Calcium: 7.5 mg/dL — ABNORMAL LOW (ref 8.9–10.3)
Calcium: 7.8 mg/dL — ABNORMAL LOW (ref 8.9–10.3)
Creatinine, Ser: 0.74 mg/dL (ref 0.44–1.00)
Creatinine, Ser: 0.85 mg/dL (ref 0.44–1.00)
Glucose, Bld: 201 mg/dL — ABNORMAL HIGH (ref 65–99)
Glucose, Bld: 253 mg/dL — ABNORMAL HIGH (ref 65–99)
POTASSIUM: 4.7 mmol/L (ref 3.5–5.1)
Potassium: 5.2 mmol/L — ABNORMAL HIGH (ref 3.5–5.1)
Sodium: 132 mmol/L — ABNORMAL LOW (ref 135–145)
Sodium: 131 mmol/L — ABNORMAL LOW (ref 135–145)
TOTAL PROTEIN: 4.4 g/dL — AB (ref 6.5–8.1)
Total Bilirubin: 2.2 mg/dL — ABNORMAL HIGH (ref 0.3–1.2)
Total Bilirubin: 2.2 mg/dL — ABNORMAL HIGH (ref 0.3–1.2)
Total Protein: 4.9 g/dL — ABNORMAL LOW (ref 6.5–8.1)

## 2016-11-14 LAB — MAGNESIUM: MAGNESIUM: 1.1 mg/dL — AB (ref 1.7–2.4)

## 2016-11-14 LAB — GLUCOSE, CAPILLARY
GLUCOSE-CAPILLARY: 212 mg/dL — AB (ref 65–99)
GLUCOSE-CAPILLARY: 155 mg/dL — AB (ref 65–99)
GLUCOSE-CAPILLARY: 97 mg/dL (ref 65–99)
Glucose-Capillary: 227 mg/dL — ABNORMAL HIGH (ref 65–99)
Glucose-Capillary: 138 mg/dL — ABNORMAL HIGH (ref 65–99)

## 2016-11-14 LAB — CBC
HEMATOCRIT: 28.5 % — AB (ref 36.0–46.0)
HEMATOCRIT: 31.2 % — AB (ref 36.0–46.0)
HEMOGLOBIN: 10.9 g/dL — AB (ref 12.0–15.0)
Hemoglobin: 9.7 g/dL — ABNORMAL LOW (ref 12.0–15.0)
MCH: 29.1 pg (ref 26.0–34.0)
MCH: 29.8 pg (ref 26.0–34.0)
MCHC: 34 g/dL (ref 30.0–36.0)
MCHC: 34.9 g/dL (ref 30.0–36.0)
MCV: 85.2 fL (ref 78.0–100.0)
MCV: 85.6 fL (ref 78.0–100.0)
PLATELETS: 116 10*3/uL — AB (ref 150–400)
Platelets: 142 10*3/uL — ABNORMAL LOW (ref 150–400)
RBC: 3.33 MIL/uL — AB (ref 3.87–5.11)
RBC: 3.66 MIL/uL — ABNORMAL LOW (ref 3.87–5.11)
RDW: 14.1 % (ref 11.5–15.5)
RDW: 14.3 % (ref 11.5–15.5)
WBC: 4 10*3/uL (ref 4.0–10.5)
WBC: 6.4 10*3/uL (ref 4.0–10.5)

## 2016-11-14 LAB — MRSA PCR SCREENING: MRSA BY PCR: NEGATIVE

## 2016-11-14 LAB — PHOSPHORUS: PHOSPHORUS: 3.9 mg/dL (ref 2.5–4.6)

## 2016-11-14 MED ORDER — ORAL CARE MOUTH RINSE
15.0000 mL | Freq: Two times a day (BID) | OROMUCOSAL | Status: DC
  Administered 2016-11-14 – 2016-12-04 (×33): 15 mL via OROMUCOSAL

## 2016-11-14 MED ORDER — INSULIN ASPART 100 UNIT/ML ~~LOC~~ SOLN
0.0000 [IU] | SUBCUTANEOUS | Status: DC
  Administered 2016-11-14: 1 [IU] via SUBCUTANEOUS
  Administered 2016-11-14: 3 [IU] via SUBCUTANEOUS
  Administered 2016-11-14: 2 [IU] via SUBCUTANEOUS
  Administered 2016-11-14: 3 [IU] via SUBCUTANEOUS
  Administered 2016-11-15 (×2): 1 [IU] via SUBCUTANEOUS
  Administered 2016-11-15: 2 [IU] via SUBCUTANEOUS
  Administered 2016-11-15 – 2016-11-16 (×6): 1 [IU] via SUBCUTANEOUS
  Administered 2016-11-16: 2 [IU] via SUBCUTANEOUS
  Administered 2016-11-16 – 2016-11-17 (×2): 1 [IU] via SUBCUTANEOUS
  Administered 2016-11-17 (×2): 2 [IU] via SUBCUTANEOUS
  Administered 2016-11-17 – 2016-11-18 (×3): 1 [IU] via SUBCUTANEOUS
  Administered 2016-11-18 – 2016-11-19 (×5): 2 [IU] via SUBCUTANEOUS
  Administered 2016-11-19 (×2): 1 [IU] via SUBCUTANEOUS
  Administered 2016-11-19 – 2016-11-20 (×2): 2 [IU] via SUBCUTANEOUS
  Administered 2016-11-20: 1 [IU] via SUBCUTANEOUS
  Administered 2016-11-20 – 2016-11-21 (×3): 2 [IU] via SUBCUTANEOUS
  Administered 2016-11-21: 3 [IU] via SUBCUTANEOUS
  Administered 2016-11-21 – 2016-11-22 (×6): 2 [IU] via SUBCUTANEOUS
  Administered 2016-11-22 – 2016-11-29 (×14): 1 [IU] via SUBCUTANEOUS
  Administered 2016-11-29 (×2): 2 [IU] via SUBCUTANEOUS
  Administered 2016-11-30: 5 [IU] via SUBCUTANEOUS
  Administered 2016-11-30: 2 [IU] via SUBCUTANEOUS
  Administered 2016-11-30: 3 [IU] via SUBCUTANEOUS

## 2016-11-14 MED ORDER — SODIUM CHLORIDE 0.9 % IV SOLN
1.0000 g | Freq: Once | INTRAVENOUS | Status: AC
  Administered 2016-11-14: 1 g via INTRAVENOUS
  Filled 2016-11-14: qty 10

## 2016-11-14 MED ORDER — SODIUM CHLORIDE 0.9 % IV BOLUS (SEPSIS)
1000.0000 mL | Freq: Once | INTRAVENOUS | Status: AC
  Administered 2016-11-14: 1000 mL via INTRAVENOUS

## 2016-11-14 MED ORDER — ROPIVACAINE HCL 2 MG/ML IJ SOLN
6.0000 mL/h | INTRAMUSCULAR | Status: DC
  Administered 2016-11-14: 6 mL/h via EPIDURAL
  Filled 2016-11-14 (×2): qty 200

## 2016-11-14 MED ORDER — SODIUM CHLORIDE 0.9 % IV SOLN: 1.0000 mg/kg/h | INTRAVENOUS | Status: DC

## 2016-11-14 MED ORDER — ATROPINE SULFATE 1 MG/10ML IJ SOSY
1.0000 mg | PREFILLED_SYRINGE | Freq: Once | INTRAMUSCULAR | Status: AC
  Administered 2016-11-14: 1 mg via INTRAVENOUS

## 2016-11-14 MED ORDER — ATROPINE SULFATE 1 MG/10ML IJ SOSY
PREFILLED_SYRINGE | INTRAMUSCULAR | Status: AC
  Filled 2016-11-14: qty 10

## 2016-11-14 MED ORDER — NOREPINEPHRINE BITARTRATE 1 MG/ML IV SOLN
0.0000 ug/min | INTRAVENOUS | Status: DC
  Administered 2016-11-14: 10 ug/min via INTRAVENOUS
  Filled 2016-11-14: qty 4

## 2016-11-14 MED ORDER — LABETALOL HCL 5 MG/ML IV SOLN
10.0000 mg | INTRAVENOUS | Status: DC | PRN
  Administered 2016-11-14 – 2016-11-16 (×3): 10 mg via INTRAVENOUS
  Filled 2016-11-14 (×4): qty 4

## 2016-11-14 NOTE — Progress Notes (Signed)
Pt BP has been trending down since Ropivacaine restarted around 1630.  Called anesthesiology and received verbal order to stop ropivacaine.  Will continue to monitor.

## 2016-11-14 NOTE — Progress Notes (Signed)
Inpatient Diabetes Program Recommendations  AACE/ADA: New Consensus Statement on Inpatient Glycemic Control (2015)  Target Ranges:  Prepandial:   less than 140 mg/dL      Peak postprandial:   less than 180 mg/dL (1-2 hours)      Critically ill patients:  140 - 180 mg/dL   Results for Rhonda Spencer, Rhonda Spencer (MRN [id number]) as of 11/14/2016 08:31  Ref. Range 11/13/2016 06:35 11/13/2016 09:18 11/14/2016 00:30 11/14/2016 05:00  Glucose Latest Ref Range: 65 - 99 mg/dL 249 (H) 133 (H) 201 (H) 253 (H)   Review of Glycemic Control  Diabetes history: None Current orders for Inpatient glycemic control: None  Inpatient Diabetes Program Recommendations:    Glucose trends increased into 200's. Due to diagnosis and procedure please consider ordering CBGs and Novolog Sensitive Correction Q4 hours.  Thanks,  Tama Headings RN, MSN, [facility] Inpatient Diabetes Coordinator Team Pager [phone] (8a-5p)

## 2016-11-14 NOTE — Progress Notes (Signed)
Initial Nutrition Assessment  DOCUMENTATION CODES:   Non-severe (moderate) malnutrition in context of chronic illness  INTERVENTION:   If plans to use J-tube for nutrition, recommend:  Vital AF 1.2 at 30 ml/h, increase by 10 ml every 4 hours to goal rate of 60 ml/h (1440 ml per day) to provide 1728 kcal, 108 gm protein, 1168 ml free water daily  RD to monitor for diet advancement and add PO supplements as appropriate.   NUTRITION DIAGNOSIS:   Malnutrition (moderate) related to chronic illness (pancreatic cancer) as evidenced by mild depletion of muscle mass, mild depletion of body fat, percent weight loss, energy intake < 75% for > or equal to 1 month.  GOAL:   Patient will meet greater than or equal to 90% of their needs  MONITOR:   Diet advancement, PO intake, Labs, I & O's  REASON FOR ASSESSMENT:   Malnutrition Screening Tool    ASSESSMENT:   76 yo female with PMH of rheumatoid arthritis, HTN, rheumatic heart disease, mitral regurgitation, aortic insufficiency who was admitted on 8/9 with pancreatic cancer. S/P Whipple procedure with placement of jejunostomy on 8/9.  Patient reports that she was eating well up until a few months ago when she lost her appetite. She went from eating 3 meals per day, to 2 meals per day, to only 1 meal per day. She has lost ~20 lbs in the past 2 months.   Nutrition-Focused physical exam completed. Findings are mild fat depletion, mild muscle depletion, and no edema.  13% weight loss within 2 months is significant. Current weight likely increased with volume overload associated with recent surgery.  Per chart review, plans to leave NGT in place until Sunday, then can start clear liquids as long as no N/V or other issues present.   Per discussion with RN, no plans to start feedings through J-tube today.  Labs reviewed: sodium 131 (L), magnesium 1.1 (L) CBG's: 227-212 Medications reviewed.  Diet Order:  Diet NPO time specified  Skin:    (abdominal incision)  Last BM:  unknown  Height:   Ht Readings from Last 1 Encounters:  11/14/16 5\' 5"  (1.651 m)    Weight:   Wt Readings from Last 1 Encounters:  11/14/16 158 lb (71.7 kg)    Ideal Body Weight:  56.8 kg  BMI:  Body mass index is 26.29 kg/m.  Estimated Nutritional Needs:   Kcal:  1600-1800  Protein:  80-90 gm  Fluid:  1.6-1.8 L  EDUCATION NEEDS:   No education needs identified at this time  Molli Barrows, [place], [place], [place] Pager [phone] After Hours Pager [phone]

## 2016-11-14 NOTE — Addendum Note (Signed)
Addendum  created 11/14/16 1021 by Glynda Jaeger, CRNA   Anesthesia Event edited

## 2016-11-14 NOTE — Progress Notes (Signed)
Epidural Rounding note  Pt had episode of bradycardia and hypotension overnight for which the epidural infusion was discontinued. It has remained off since that time. We were called to pull epidural, but Dr. Barry Dienes did not specifically request that. I called [facility] cross coverage (Dr. Ninfa Linden) and he felt, based on Dr. Marlowe Aschoff signout notes that the epidural should be restarted. I agree, restarted at Ropivicaine 0.2% @ 59ml/hr.  Sensory and motor exam grossly normal. Epidural site without occlusive dressing so site cannot be evaluated.   Plan: continue epidural. Please call with questions or concerns.

## 2016-11-14 NOTE — Progress Notes (Signed)
1 Day Post-Op   Subjective/Chief Complaint: Pt had an episode of bradycardia last night and require atropine and pressors briefly.  This resolved.  Epidural was held.  She is sore, but using her pCA.  She denies nausea.     Objective: Vital signs in last 24 hours: Temp:  [95.9 F (35.5 C)-98.6 F (37 C)] 98.4 F (36.9 C) (08/10 1147) Pulse Rate:  [39-111] 84 (08/10 1200) Resp:  [10-27] 20 (08/10 1200) BP: (73-182)/(42-77) 178/65 (08/10 1200) SpO2:  [96 %-100 %] 99 % (08/10 1200) Arterial Line BP: (53-168)/(29-79) 168/79 (08/10 1200) Weight:  [71.7 kg (158 lb)] 71.7 kg (158 lb) (08/10 0110)    Intake/Output from previous day: 08/09 0701 - 08/10 0700 In: 7475 [I.V.:5090; Blood:630; IV Piggyback:755] Out: 1435 [Urine:935; Emesis/NG output:50; Drains:150; Blood:300] Intake/Output this shift: Total I/O In: 300 [I.V.:300] Out: 350 [Urine:350]  General appearance: alert, cooperative and mild distress Resp: breathing comfortably Cardio: regular rate and rhythm GI: soft, non distended, approp tender.  drains serosang.  dressing c/d/i. NGT with bile in it.  Lab Results:   Recent Labs  11/14/16 0030 11/14/16 0500  WBC 4.0 6.4  HGB 9.7* 10.9*  HCT 28.5* 31.2*  PLT 116* 142*   BMET  Recent Labs  11/14/16 0030 11/14/16 0500  NA 132* 131*  K 5.2* 4.7  CL 108 105  CO2 17* 17*  GLUCOSE 201* 253*  BUN 12 12  CREATININE 0.74 0.85  CALCIUM 7.5* 7.8*   PT/INR No results for input(s): LABPROT, INR in the last 72 hours. ABG  Recent Labs  11/13/16 1018  PHART 7.285*  HCO3 18.8*    Studies/Results: No results found.  Anti-infectives: Anti-infectives    Start     Dose/Rate Route Frequency Ordered Stop   11/13/16 2100  ceFAZolin (ANCEF) IVPB 2g/100 mL premix     2 g 200 mL/hr over 30 Minutes Intravenous Every 8 hours 11/13/16 1953 11/13/16 2057   11/13/16 0622  ceFAZolin (ANCEF) 2-4 GM/100ML-% IVPB    Comments:  Tamsen Snider   : cabinet override   11/13/16 0622 11/13/16 0801   11/13/16 0618  ceFAZolin (ANCEF) IVPB 2g/100 mL premix     2 g 200 mL/hr over 30 Minutes Intravenous On call to O.R. 11/13/16 1610 11/13/16 0806      Assessment/Plan: s/p Procedure(s): ATTEMPTED DAGNOSTIC LAPAROSCOPY (N/A) WHIPPLE PROCEDURE (N/A) PLACEMENT JEJUNOSTOMY TUBE (N/A) Discuss wtih anesthesia whether to restart epidural or not.  If epidural to come out, will start lovenox (tomorrow given blood transfused yesterday) Will leave NGT until Sunday, then can start clears as long as as no n/v or other issues.   HTN meds as needed.   Foley for now for monitoring. Stay in ICU given episode of cardiac irritability. Start RISS for hyperglycemia. Replete Ca.     LOS: 1 day    [facility] 11/14/2016

## 2016-11-14 NOTE — Progress Notes (Signed)
Epidural Rounding Note:  Call from nurse taking care of Ms. Rhonda Spencer regarding patient's recent onset of hypotension.  Epidural had been restarted at 1635 at Ropivicaine 0.2% @ 60ml/hr.  Last NIBP charted was 89/54 at 1800.  Given onset of hypotension, advised to pause the epidural ropivacaine infusion and discuss with primary team if hypotension does not resolve.  Plan: Pause epidural infusion.  Please call with questions or concerns or if BP improves and primary team would like to discuss restarting epidural infusion.

## 2016-11-15 LAB — GLUCOSE, CAPILLARY
GLUCOSE-CAPILLARY: 158 mg/dL — AB (ref 65–99)
GLUCOSE-CAPILLARY: 134 mg/dL — AB (ref 65–99)
Glucose-Capillary: 124 mg/dL — ABNORMAL HIGH (ref 65–99)
Glucose-Capillary: 146 mg/dL — ABNORMAL HIGH (ref 65–99)
Glucose-Capillary: 142 mg/dL — ABNORMAL HIGH (ref 65–99)
Glucose-Capillary: 132 mg/dL — ABNORMAL HIGH (ref 65–99)

## 2016-11-15 LAB — COMPREHENSIVE METABOLIC PANEL
ALK PHOS: 74 U/L (ref 38–126)
ALT: 47 U/L (ref 14–54)
AST: 67 U/L — ABNORMAL HIGH (ref 15–41)
Albumin: 2.6 g/dL — ABNORMAL LOW (ref 3.5–5.0)
Anion gap: 8 (ref 5–15)
BILIRUBIN TOTAL: 1.4 mg/dL — AB (ref 0.3–1.2)
BUN: 8 mg/dL (ref 6–20)
CHLORIDE: 111 mmol/L (ref 101–111)
CO2: 17 mmol/L — ABNORMAL LOW (ref 22–32)
Calcium: 8.2 mg/dL — ABNORMAL LOW (ref 8.9–10.3)
Creatinine, Ser: 0.72 mg/dL (ref 0.44–1.00)
GFR calc non Af Amer: >60 mL/min (ref >60)
Glucose, Bld: 135 mg/dL — ABNORMAL HIGH (ref 65–99)
POTASSIUM: 4.1 mmol/L (ref 3.5–5.1)
SODIUM: 136 mmol/L (ref 135–145)
TOTAL PROTEIN: 4.9 g/dL — AB (ref 6.5–8.1)

## 2016-11-15 LAB — CBC
HEMATOCRIT: 32.2 % — AB (ref 36.0–46.0)
HEMOGLOBIN: 11.1 g/dL — AB (ref 12.0–15.0)
MCH: 29.9 pg (ref 26.0–34.0)
MCHC: 34.5 g/dL (ref 30.0–36.0)
MCV: 86.8 fL (ref 78.0–100.0)
Platelets: 135 10*3/uL — ABNORMAL LOW (ref 150–400)
RBC: 3.71 MIL/uL — AB (ref 3.87–5.11)
RDW: 14.5 % (ref 11.5–15.5)
WBC: 9.8 10*3/uL (ref 4.0–10.5)

## 2016-11-15 MED ORDER — VITAL AF 1.2 CAL PO LIQD
1000.0000 mL | ORAL | Status: DC
  Administered 2016-11-15: 1000 mL
  Filled 2016-11-15 (×3): qty 1000

## 2016-11-15 MED ORDER — VITAL HIGH PROTEIN PO LIQD
1000.0000 mL | ORAL | Status: DC
  Administered 2016-11-15: 1000 mL

## 2016-11-15 NOTE — Progress Notes (Signed)
Nutrition Consult/Brief Note  RD consulted for TF initiation and management. Initial nutrition assessment completed 8/10. Spoke with Dr. Ninfa Linden. Confirmed plan is to start trickle tube feeding today with no advancement. Vital AF 1.2 formula ordered at 10 ml/hr via J-tube. RD to continue to follow for nutrition care plan.  Arthur Holms, RD, LDN Pager #: [phone] After-Hours Pager #: [phone]

## 2016-11-15 NOTE — Progress Notes (Signed)
2 Days Post-Op   Subjective/Chief Complaint: Awake and alert Rhonda Spencer again when epidural started Denies nausea   Objective: Vital signs in last 24 hours: Temp:  [97.6 F (36.4 C)-98.7 F (37.1 C)] 97.6 F (36.4 C) (08/11 0800) Pulse Rate:  [65-103] 95 (08/11 0800) Resp:  [14-22] 19 (08/11 0800) BP: (42-187)/(26-99) 179/81 (08/11 0800) SpO2:  [97 %-100 %] 99 % (08/11 0800) Arterial Line BP: (46-245)/(38-237) 245/237 (08/11 0530) FiO2 (%):  [99 %] 99 % (08/10 1634)    Intake/Output from previous day: 08/10 0701 - 08/11 0700 In: 1979.8 [I.V.:1800; IV Piggyback:110] Out: 3030 [Urine:2765; Emesis/NG output:110; Drains:155] Intake/Output this shift: Total I/O In: 75 [I.V.:75] Out: -   Exam: Looks comfortable Lungs clear abd soft, drain serosang  Lab Results:   Recent Labs  11/14/16 0500 11/15/16 0450  WBC 6.4 9.8  HGB 10.9* 11.1*  HCT 31.2* 32.2*  PLT 142* 135*   BMET  Recent Labs  11/14/16 0500 11/15/16 0450  NA 131* 136  K 4.7 4.1  CL 105 111  CO2 17* 17*  GLUCOSE 253* 135*  BUN 12 8  CREATININE 0.85 0.72  CALCIUM 7.8* 8.2*   PT/INR No results for input(s): LABPROT, INR in the last 72 hours. ABG  Recent Labs  11/13/16 1018  PHART 7.285*  HCO3 18.8*    Studies/Results: No results found.  Anti-infectives: Anti-infectives    Start     Dose/Rate Route Frequency Ordered Stop   11/13/16 2100  ceFAZolin (ANCEF) IVPB 2g/100 mL premix     2 g 200 mL/hr over 30 Minutes Intravenous Every 8 hours 11/13/16 1953 11/13/16 2057   11/13/16 0622  ceFAZolin (ANCEF) 2-4 GM/100ML-% IVPB    Comments:  Tamsen Snider   : cabinet override      11/13/16 0622 11/13/16 0801   11/13/16 0618  ceFAZolin (ANCEF) IVPB 2g/100 mL premix     2 g 200 mL/hr over 30 Minutes Intravenous On call to O.R. 11/13/16 0618 11/13/16 0806      Assessment/Plan: s/p Procedure(s): ATTEMPTED DAGNOSTIC LAPAROSCOPY (N/A) WHIPPLE PROCEDURE (N/A) PLACEMENT JEJUNOSTOMY TUBE  (N/A)  Continue NPO and NG Keep foley for strict I's and O's one more day Will ask anesthesia about removing epidural Start low dose tube feeds 10cc/hr  LOS: 2 days    Rhonda Spencer A 11/15/2016

## 2016-11-15 NOTE — Anesthesia Post-op Follow-up Note (Signed)
  Anesthesia Pain Follow-up Note  Patient: Rhonda Spencer  Day #: 3  Date of Follow-up: 11/15/2016 Time: 10:28 AM  Last Vitals:  Vitals:   11/15/16 0900 11/15/16 1000  BP: (!) 152/75 (!) 168/79  Pulse: 93 93  Resp: 17 15  Temp:    SpO2: 99% 99%    Level of Consciousness: alert  Pain: mild   Side Effects:None  Catheter Site Exam:clean  Epidural / Intrathecal    Start     Dose/Rate Route Frequency Ordered Stop   11/14/16 1645  ropivacaine (PF) 2 mg/mL (0.2%) (NAROPIN) injection     6 mL/hr 6 mL/hr  Epidural Continuous 11/14/16 1633         Plan: D/C Infusion at surgeon's request. Epidural catheter removed tip intact site [place]

## 2016-11-16 LAB — COMPREHENSIVE METABOLIC PANEL
ALT: 36 U/L (ref 14–54)
AST: 40 U/L (ref 15–41)
Albumin: 2.5 g/dL — ABNORMAL LOW (ref 3.5–5.0)
Alkaline Phosphatase: 79 U/L (ref 38–126)
Anion gap: 5 (ref 5–15)
BUN: <5 mg/dL — ABNORMAL LOW (ref 6–20)
CHLORIDE: 108 mmol/L (ref 101–111)
CO2: 24 mmol/L (ref 22–32)
CREATININE: 0.64 mg/dL (ref 0.44–1.00)
Calcium: 8.1 mg/dL — ABNORMAL LOW (ref 8.9–10.3)
GFR calc non Af Amer: >60 mL/min (ref >60)
Glucose, Bld: 110 mg/dL — ABNORMAL HIGH (ref 65–99)
Potassium: 3.9 mmol/L (ref 3.5–5.1)
Sodium: 137 mmol/L (ref 135–145)
Total Bilirubin: 1.4 mg/dL — ABNORMAL HIGH (ref 0.3–1.2)
Total Protein: 5.2 g/dL — ABNORMAL LOW (ref 6.5–8.1)

## 2016-11-16 LAB — GLUCOSE, CAPILLARY
GLUCOSE-CAPILLARY: 134 mg/dL — AB (ref 65–99)
GLUCOSE-CAPILLARY: 167 mg/dL — AB (ref 65–99)
GLUCOSE-CAPILLARY: 136 mg/dL — AB (ref 65–99)
Glucose-Capillary: 124 mg/dL — ABNORMAL HIGH (ref 65–99)
Glucose-Capillary: 122 mg/dL — ABNORMAL HIGH (ref 65–99)

## 2016-11-16 LAB — TYPE AND SCREEN
ABO/RH(D): AB POS
Antibody Screen: NEGATIVE
UNIT DIVISION: 0
Unit division: 0
Unit division: 0
Unit division: 0

## 2016-11-16 LAB — BPAM RBC
BLOOD PRODUCT EXPIRATION DATE: 201808252359
BLOOD PRODUCT EXPIRATION DATE: 201808282359
Blood Product Expiration Date: 201808242359
Blood Product Expiration Date: 201808282359
ISSUE DATE / TIME: 201808080912
ISSUE DATE / TIME: 201808091624
ISSUE DATE / TIME: 201808091720
UNIT TYPE AND RH: 8400
UNIT TYPE AND RH: 8400
Unit Type and Rh: 6200
Unit Type and Rh: 8400

## 2016-11-16 LAB — CBC
HCT: 32.3 % — ABNORMAL LOW (ref 36.0–46.0)
Hemoglobin: 10.9 g/dL — ABNORMAL LOW (ref 12.0–15.0)
MCH: 28.9 pg (ref 26.0–34.0)
MCHC: 33.7 g/dL (ref 30.0–36.0)
MCV: 85.7 fL (ref 78.0–100.0)
PLATELETS: 160 10*3/uL (ref 150–400)
RBC: 3.77 MIL/uL — AB (ref 3.87–5.11)
RDW: 14.2 % (ref 11.5–15.5)
WBC: 9.1 10*3/uL (ref 4.0–10.5)

## 2016-11-16 NOTE — Progress Notes (Signed)
Wasted 110ml of Morphine that was in remaining PCA in sharps container with Leroy Libman.

## 2016-11-16 NOTE — Progress Notes (Signed)
Patient arrived to 6n09, alert and oriented, mild pain to abdomen, PCA and IV fluids infusing, on 2L o2, placed on telemetry. One midline with staples and two drains noted to mid abdomen, feeding tube intact and tube feeds running. Family at bedside, will continue to monitor.

## 2016-11-16 NOTE — Progress Notes (Signed)
Wasted 64ml of Morphine that was in remaining PCA in sharps container with Lynett Fish.

## 2016-11-16 NOTE — Progress Notes (Signed)
3 Days Post-Op Whipple   Subjective/Chief Complaint: Awake and alert Epidural out Denies nausea   Objective: Vital signs in last 24 hours: Temp:  [97.8 F (36.6 C)-98.9 F (37.2 C)] 97.8 F (36.6 C) (08/12 0800) Pulse Rate:  [71-98] 71 (08/12 0900) Resp:  [14-24] 14 (08/12 0900) BP: (140-202)/(69-97) 158/76 (08/12 0900) SpO2:  [94 %-100 %] 97 % (08/12 0900)    Intake/Output from previous day: 08/11 0701 - 08/12 0700 In: 2005.8 [I.V.:1800; NG/GT:205.8] Out: 2195 [Urine:2175; Drains:20] Intake/Output this shift: Total I/O In: 150 [I.V.:150] Out: -   Exam: Looks comfortable Lungs clear abd soft, drain serosang  Lab Results:   Recent Labs  11/15/16 0450 11/16/16 0227  WBC 9.8 9.1  HGB 11.1* 10.9*  HCT 32.2* 32.3*  PLT 135* 160   BMET  Recent Labs  11/15/16 0450 11/16/16 0227  NA 136 137  K 4.1 3.9  CL 111 108  CO2 17* 24  GLUCOSE 135* 110*  BUN 8 <5*  CREATININE 0.72 0.64  CALCIUM 8.2* 8.1*   PT/INR No results for input(s): LABPROT, INR in the last 72 hours. ABG  Recent Labs  11/13/16 1018  PHART 7.285*  HCO3 18.8*    Studies/Results: No results found.  Anti-infectives: Anti-infectives    Start     Dose/Rate Route Frequency Ordered Stop   11/13/16 2100  ceFAZolin (ANCEF) IVPB 2g/100 mL premix     2 g 200 mL/hr over 30 Minutes Intravenous Every 8 hours 11/13/16 1953 11/13/16 2057   11/13/16 0622  ceFAZolin (ANCEF) 2-4 GM/100ML-% IVPB    Comments:  Tamsen Snider   : cabinet override      11/13/16 0622 11/13/16 0801   11/13/16 0618  ceFAZolin (ANCEF) IVPB 2g/100 mL premix     2 g 200 mL/hr over 30 Minutes Intravenous On call to O.R. 11/13/16 0618 11/13/16 0806      Assessment/Plan: s/p Procedure(s): ATTEMPTED DAGNOSTIC LAPAROSCOPY (N/A) WHIPPLE PROCEDURE (N/A) PLACEMENT JEJUNOSTOMY TUBE (N/A)  D/c NG and start clears D/c foley Cont low dose tube feeds 10cc/hr Transfer to floor   LOS: 3 days    Nadelyn Enriques  C. 5/68/1275

## 2016-11-17 LAB — GLUCOSE, CAPILLARY
GLUCOSE-CAPILLARY: 184 mg/dL — AB (ref 65–99)
Glucose-Capillary: 118 mg/dL — ABNORMAL HIGH (ref 65–99)
Glucose-Capillary: 141 mg/dL — ABNORMAL HIGH (ref 65–99)
Glucose-Capillary: 143 mg/dL — ABNORMAL HIGH (ref 65–99)
Glucose-Capillary: 150 mg/dL — ABNORMAL HIGH (ref 65–99)
Glucose-Capillary: 157 mg/dL — ABNORMAL HIGH (ref 65–99)
Glucose-Capillary: 172 mg/dL — ABNORMAL HIGH (ref 65–99)

## 2016-11-17 LAB — COMPREHENSIVE METABOLIC PANEL
ALBUMIN: 2.4 g/dL — AB (ref 3.5–5.0)
ALK PHOS: 73 U/L (ref 38–126)
ALT: 29 U/L (ref 14–54)
AST: 26 U/L (ref 15–41)
Anion gap: 7 (ref 5–15)
BILIRUBIN TOTAL: 1.4 mg/dL — AB (ref 0.3–1.2)
CALCIUM: 8.2 mg/dL — AB (ref 8.9–10.3)
CO2: 25 mmol/L (ref 22–32)
Chloride: 104 mmol/L (ref 101–111)
Creatinine, Ser: 0.65 mg/dL (ref 0.44–1.00)
GFR calc Af Amer: >60 mL/min (ref >60)
GFR calc non Af Amer: >60 mL/min (ref >60)
GLUCOSE: 137 mg/dL — AB (ref 65–99)
POTASSIUM: 3.6 mmol/L (ref 3.5–5.1)
Sodium: 136 mmol/L (ref 135–145)
TOTAL PROTEIN: 5.5 g/dL — AB (ref 6.5–8.1)

## 2016-11-17 LAB — CBC
HEMATOCRIT: 32.8 % — AB (ref 36.0–46.0)
HEMOGLOBIN: 11.1 g/dL — AB (ref 12.0–15.0)
MCH: 29.5 pg (ref 26.0–34.0)
MCHC: 33.8 g/dL (ref 30.0–36.0)
MCV: 87.2 fL (ref 78.0–100.0)
Platelets: 187 10*3/uL (ref 150–400)
RBC: 3.76 MIL/uL — ABNORMAL LOW (ref 3.87–5.11)
RDW: 14.1 % (ref 11.5–15.5)
WBC: 6.3 10*3/uL (ref 4.0–10.5)

## 2016-11-17 MED ORDER — MORPHINE SULFATE (PF) 4 MG/ML IV SOLN
1.0000 mg | INTRAVENOUS | Status: DC | PRN
  Administered 2016-11-17: 1 mg via INTRAVENOUS
  Administered 2016-11-17 – 2016-11-24 (×18): 2 mg via INTRAVENOUS
  Administered 2016-11-25 (×2): 1 mg via INTRAVENOUS
  Administered 2016-11-26 – 2016-11-28 (×5): 2 mg via INTRAVENOUS
  Filled 2016-11-17 (×26): qty 1

## 2016-11-17 MED ORDER — VITAL AF 1.2 CAL PO LIQD
1000.0000 mL | ORAL | Status: DC
  Administered 2016-11-17 – 2016-11-18 (×2): 1000 mL
  Filled 2016-11-17 (×5): qty 1000

## 2016-11-17 NOTE — Care Management Important Message (Signed)
Important Message  Patient Details  Name: Rhonda Spencer MRN: [id number] Date of Birth: 1940-12-16   Medicare Important Message Given:  Yes    Nathan Moctezuma [address] 11/17/2016, 11:16 AM

## 2016-11-17 NOTE — Progress Notes (Signed)
Wasted 22.47ml of Morphine that was in remaining PCA in sharps container with Lynett Fish.

## 2016-11-17 NOTE — Progress Notes (Signed)
4 Days Post-Op Whipple   Subjective/Chief Complaint: Having some reflux.  No flatus yet.  Tolerating tube feeds at 10 ml/hr.   Objective: Vital signs in last 24 hours: Temp:  [98.2 F (36.8 C)-99.5 F (37.5 C)] 98.2 F (36.8 C) (08/13 1416) Pulse Rate:  [83-94] 87 (08/13 1416) Resp:  [17-24] 17 (08/13 0800) BP: (142-175)/(68-77) 142/77 (08/13 1416) SpO2:  [96 %-100 %] 100 % (08/13 1416) Weight:  [67.9 kg (149 lb 12.8 oz)] 67.9 kg (149 lb 12.8 oz) (08/13 0557)    Intake/Output from previous day: 08/12 0701 - 08/13 0700 In: 1642.5 [P.O.:60; I.V.:1582.5] Out: 1450 [Urine:1325; Drains:125] Intake/Output this shift: Total I/O In: -  Out: 750 [Urine:750]  Exam: Looks comfortable Lungs clear abd soft, drain serosang  Lab Results:   Recent Labs  11/16/16 0227 11/17/16 0319  WBC 9.1 6.3  HGB 10.9* 11.1*  HCT 32.3* 32.8*  PLT 160 187   BMET  Recent Labs  11/16/16 0227 11/17/16 0319  NA 137 136  K 3.9 3.6  CL 108 104  CO2 24 25  GLUCOSE 110* 137*  BUN <5* <5*  CREATININE 0.64 0.65  CALCIUM 8.1* 8.2*   PT/INR No results for input(s): LABPROT, INR in the last 72 hours. ABG No results for input(s): PHART, HCO3 in the last 72 hours.  Invalid input(s): PCO2, PO2  Studies/Results: No results found.  Anti-infectives: Anti-infectives    Start     Dose/Rate Route Frequency Ordered Stop   11/13/16 2100  ceFAZolin (ANCEF) IVPB 2g/100 mL premix     2 g 200 mL/hr over 30 Minutes Intravenous Every 8 hours 11/13/16 1953 11/13/16 2057   11/13/16 0622  ceFAZolin (ANCEF) 2-4 GM/100ML-% IVPB    Comments:  Tamsen Snider   : cabinet override      11/13/16 0622 11/13/16 0801   11/13/16 0618  ceFAZolin (ANCEF) IVPB 2g/100 mL premix     2 g 200 mL/hr over 30 Minutes Intravenous On call to O.R. 11/13/16 0618 11/13/16 0806      Assessment/Plan: s/p Procedure(s): ATTEMPTED DAGNOSTIC LAPAROSCOPY (N/A) WHIPPLE PROCEDURE (N/A) PLACEMENT JEJUNOSTOMY TUBE  (N/A)  Continue clear liquids Increase tube feeds 20cc/hr Hold on diet advance until we get flatus.     LOS: 4 days    [facility] 11/17/2016

## 2016-11-18 LAB — COMPREHENSIVE METABOLIC PANEL
ALBUMIN: 2.2 g/dL — AB (ref 3.5–5.0)
ALT: 20 U/L (ref 14–54)
AST: 22 U/L (ref 15–41)
Alkaline Phosphatase: 69 U/L (ref 38–126)
Anion gap: 5 (ref 5–15)
BUN: 5 mg/dL — AB (ref 6–20)
CHLORIDE: 104 mmol/L (ref 101–111)
CO2: 25 mmol/L (ref 22–32)
Calcium: 7.9 mg/dL — ABNORMAL LOW (ref 8.9–10.3)
Creatinine, Ser: 0.61 mg/dL (ref 0.44–1.00)
GFR calc Af Amer: >60 mL/min (ref >60)
GLUCOSE: 166 mg/dL — AB (ref 65–99)
POTASSIUM: 3.5 mmol/L (ref 3.5–5.1)
SODIUM: 134 mmol/L — AB (ref 135–145)
Total Bilirubin: 1.2 mg/dL (ref 0.3–1.2)
Total Protein: 4.9 g/dL — ABNORMAL LOW (ref 6.5–8.1)

## 2016-11-18 LAB — CBC
HCT: 30.1 % — ABNORMAL LOW (ref 36.0–46.0)
Hemoglobin: 10.1 g/dL — ABNORMAL LOW (ref 12.0–15.0)
MCH: 28.8 pg (ref 26.0–34.0)
MCHC: 33.6 g/dL (ref 30.0–36.0)
MCV: 85.8 fL (ref 78.0–100.0)
PLATELETS: 180 10*3/uL (ref 150–400)
RBC: 3.51 MIL/uL — ABNORMAL LOW (ref 3.87–5.11)
RDW: 13.4 % (ref 11.5–15.5)
WBC: 3.8 10*3/uL — AB (ref 4.0–10.5)

## 2016-11-18 LAB — GLUCOSE, CAPILLARY
GLUCOSE-CAPILLARY: 176 mg/dL — AB (ref 65–99)
GLUCOSE-CAPILLARY: 166 mg/dL — AB (ref 65–99)
GLUCOSE-CAPILLARY: 164 mg/dL — AB (ref 65–99)
GLUCOSE-CAPILLARY: 146 mg/dL — AB (ref 65–99)
Glucose-Capillary: 112 mg/dL — ABNORMAL HIGH (ref 65–99)

## 2016-11-18 MED ORDER — ERYTHROMYCIN ETHYLSUCCINATE 400 MG/5ML PO SUSR
200.0000 mg | Freq: Three times a day (TID) | ORAL | Status: DC
  Administered 2016-11-19 – 2016-11-23 (×11): 200 mg via ORAL
  Filled 2016-11-18 (×15): qty 2.5
  Filled 2016-11-18: qty 5
  Filled 2016-11-18 (×4): qty 2.5

## 2016-11-18 NOTE — Progress Notes (Signed)
5 Days Post-Op Whipple   Subjective/Chief Complaint: Threw up yesterday.     Objective: Vital signs in last 24 hours: Temp:  [98.5 F (36.9 C)-98.9 F (37.2 C)] 98.9 F (37.2 C) (08/14 1411) Pulse Rate:  [87-98] 87 (08/14 1411) Resp:  [17-18] 18 (08/14 1411) BP: (141-170)/(70-80) 170/75 (08/14 1411) SpO2:  [95 %-100 %] 97 % (08/14 1411) Weight:  [65.9 kg (145 lb 3.2 oz)] 65.9 kg (145 lb 3.2 oz) (08/14 0350)    Intake/Output from previous day: 08/13 0701 - 08/14 0700 In: 2085 [I.V.:1975; NG/GT:110] Out: 1480 [Urine:1100; Emesis/NG output:300; Drains:80] Intake/Output this shift: Total I/O In: 1500 [P.O.:600; NG/GT:900] Out: 350 [Urine:350]  Exam: Looks comfortable Lungs clear abd soft, drain serosang  Lab Results:   Recent Labs  11/17/16 0319 11/18/16 0532  WBC 6.3 3.8*  HGB 11.1* 10.1*  HCT 32.8* 30.1*  PLT 187 180   BMET  Recent Labs  11/17/16 0319 11/18/16 0532  NA 136 134*  K 3.6 3.5  CL 104 104  CO2 25 25  GLUCOSE 137* 166*  BUN <5* 5*  CREATININE 0.65 0.61  CALCIUM 8.2* 7.9*   PT/INR No results for input(s): LABPROT, INR in the last 72 hours. ABG No results for input(s): PHART, HCO3 in the last 72 hours.  Invalid input(s): PCO2, PO2  Studies/Results: No results found.  Anti-infectives: Anti-infectives    Start     Dose/Rate Route Frequency Ordered Stop   11/13/16 2100  ceFAZolin (ANCEF) IVPB 2g/100 mL premix     2 g 200 mL/hr over 30 Minutes Intravenous Every 8 hours 11/13/16 1953 11/13/16 2057   11/13/16 0622  ceFAZolin (ANCEF) 2-4 GM/100ML-% IVPB    Comments:  Tamsen Snider   : cabinet override      11/13/16 0622 11/13/16 0801   11/13/16 0618  ceFAZolin (ANCEF) IVPB 2g/100 mL premix     2 g 200 mL/hr over 30 Minutes Intravenous On call to O.R. 11/13/16 0618 11/13/16 0806      Assessment/Plan: s/p Procedure(s): ATTEMPTED DAGNOSTIC LAPAROSCOPY (N/A) WHIPPLE PROCEDURE (N/A) PLACEMENT JEJUNOSTOMY TUBE (N/A)  Continue clear  liquids as tolerated Leave tube feeds 20cc/hr  Would like to add reglan, but Qtc already elongated.  Will try erythromycin.  Hold on diet advance until we get flatus.     LOS: 5 days    [facility] 11/18/2016

## 2016-11-18 NOTE — Progress Notes (Signed)
Patient vomited 500 cc of  green emesis, Compazine 10 mg IV given with good relief.

## 2016-11-19 LAB — GLUCOSE, CAPILLARY
GLUCOSE-CAPILLARY: 181 mg/dL — AB (ref 65–99)
GLUCOSE-CAPILLARY: 131 mg/dL — AB (ref 65–99)
GLUCOSE-CAPILLARY: 175 mg/dL — AB (ref 65–99)
Glucose-Capillary: 142 mg/dL — ABNORMAL HIGH (ref 65–99)
Glucose-Capillary: 120 mg/dL — ABNORMAL HIGH (ref 65–99)
Glucose-Capillary: 85 mg/dL (ref 65–99)

## 2016-11-19 MED ORDER — BISACODYL 10 MG RE SUPP
10.0000 mg | Freq: Every day | RECTAL | Status: DC
  Administered 2016-11-19 – 2016-11-24 (×6): 10 mg via RECTAL
  Filled 2016-11-19 (×7): qty 1

## 2016-11-19 NOTE — Consult Note (Signed)
[facility] CM Primary Care Navigator  11/19/2016  Rhonda Spencer Dec 23, 1940 [id number]   Met withpatient, daughter Rhonda Spencer) and son Rhonda Spencer) at the bedside to identify possibledischarge needs.     Patient reports having weight loss, feeling weak and discolored stools that hadled to this admission/ surgery.  Patient endorses Dr. Iona Beard with [facility] as her primary care provider.   Patient reportsusing [facility] [address] obtain medications without difficulty so far.  Patient manages her own medications at home with use of "pill box" system filled weekly.  Patient mentioned that son (lives nearby) or daughter has been providing transportation to [employer]' appointments.  Patient states that she is currently living with daughter. Both son and daughter are her primary caregivers at home as stated.  Discharge plan isnot yet determined pending surgical improvement, therapy recommendation and physician order.   Patient and familyvoiced understanding to call primary care provider's office when she returns back home for a post discharge follow-up appointment within a week or sooner if needs arise.Patient letter (with PCP's contact number) wasprovided as theirreminder.  Explained to patient and familyregardingTHN CM services available for further healthmanagement.Patient and family denied any needs or concerns at this time. Patientand family expressed understanding to seekreferral to [facility] care managementservicesfrom primary care provider ifdeemed necessary andappropriatefor services in the future.   [facility] care management information provided for any future needs that he may have.   For questions, please contact:  Dannielle Huh, BSN, RN- [facility] Primary Care Navigator  Telephone: [phone] [place]

## 2016-11-19 NOTE — Progress Notes (Signed)
Patient ID: Rhonda Spencer, female   DOB: 1940-07-09, 76 y.o.   MRN: [id number] 6 Days Post-Op Whipple   Subjective/Chief Complaint: Threw up again.  No BM or flatus.     Objective: Vital signs in last 24 hours: Temp:  [98.4 F (36.9 C)-98.9 F (37.2 C)] 98.9 F (37.2 C) (08/15 0501) Pulse Rate:  [87-97] 97 (08/15 0501) Resp:  [17-18] 17 (08/15 0501) BP: (167-180)/(71-83) 167/83 (08/15 0501) SpO2:  [97 %-99 %] 99 % (08/15 0501)    Intake/Output from previous day: 08/14 0701 - 08/15 0700 In: 3480 [P.O.:1080; I.V.:900; NG/GT:1140] Out: 470 [Urine:350; Drains:120] Intake/Output this shift: No intake/output data recorded.  Exam: Looks comfortable Alert.  Breathing comfortably abd soft, drain serosang.  Midline incision c/d/i.    Lab Results:   Recent Labs  11/17/16 0319 11/18/16 0532  WBC 6.3 3.8*  HGB 11.1* 10.1*  HCT 32.8* 30.1*  PLT 187 180   BMET  Recent Labs  11/17/16 0319 11/18/16 0532  NA 136 134*  K 3.6 3.5  CL 104 104  CO2 25 25  GLUCOSE 137* 166*  BUN <5* 5*  CREATININE 0.65 0.61  CALCIUM 8.2* 7.9*   PT/INR No results for input(s): LABPROT, INR in the last 72 hours. ABG No results for input(s): PHART, HCO3 in the last 72 hours.  Invalid input(s): PCO2, PO2  Studies/Results: No results found.  Anti-infectives: Anti-infectives    Start     Dose/Rate Route Frequency Ordered Stop   11/18/16 1830  erythromycin (EES) 400 MG/5ML suspension 200 mg     200 mg Oral Every 8 hours 11/18/16 1728     11/13/16 2100  ceFAZolin (ANCEF) IVPB 2g/100 mL premix     2 g 200 mL/hr over 30 Minutes Intravenous Every 8 hours 11/13/16 1953 11/13/16 2057   11/13/16 0622  ceFAZolin (ANCEF) 2-4 GM/100ML-% IVPB    Comments:  Tamsen Snider   : cabinet override      11/13/16 0622 11/13/16 0801   11/13/16 0618  ceFAZolin (ANCEF) IVPB 2g/100 mL premix     2 g 200 mL/hr over 30 Minutes Intravenous On call to O.R. 11/13/16 0618 11/13/16 0806       Assessment/Plan: s/p Procedure(s): ATTEMPTED DAGNOSTIC LAPAROSCOPY (N/A) WHIPPLE PROCEDURE (N/A) PLACEMENT JEJUNOSTOMY TUBE (N/A)  Continue clear liquids as tolerated Leave tube feeds 20cc/hr  Would like to add reglan, but Qtc already elongated.  Erythromycin.  Hold on diet advance until we get flatus. Suppository today.       LOS: 6 days    [facility] 11/19/2016

## 2016-11-19 NOTE — Progress Notes (Signed)
Nutrition Follow-up  DOCUMENTATION CODES:   Non-severe (moderate) malnutrition in context of chronic illness  INTERVENTION:   -RD to follow for diet advancement -Continue Vital AF 1.2 at 20 ml/hr via j-tube. Recmmend advance slowly to goal rate of 60 ml/hr, per MD discretion.  At goal rate, to provide 1728 kcal, 108 gm protein, 1168 ml free water daily  NUTRITION DIAGNOSIS:   Malnutrition (moderate) related to chronic illness (pancreatic cancer) as evidenced by mild depletion of muscle mass, mild depletion of body fat, percent weight loss, energy intake < 75% for > or equal to 1 month.  Ongoing  GOAL:   Patient will meet greater than or equal to 90% of their needs  Progressing  MONITOR:   Diet advancement, PO intake, Labs, I & O's  REASON FOR ASSESSMENT:   Malnutrition Screening Tool    ASSESSMENT:   76 yo female with PMH of rheumatoid arthritis, HTN, rheumatic heart disease, mitral regurgitation, aortic insufficiency who was admitted on 8/9 with pancreatic cancer. S/P Whipple procedure with placement of jejunostomy on 8/9.  8/12- NGT removed, CLD started, x-fer from ICU to floor 8/13- TF (Vital AF 1.2) increased to 20 ml/hr (providing 576 kcals, 36 grams protein, and 406 ml free water- meeting 36% of estimated energy needs and 455 of estimated protein intake)  Spoke with pt at bedside, who reports feeling better today. She is in good spirits. She reports minimal intake of clear liquid diet- she reports consuming a few sips of water today, but has not attempted anything else. Pt also reports tolerating TF well, but feels full and does not eat.   Pt reports she was just given a suppository, as she has not had a BM yet. She reports vomiting last night, but has not done so this shift.   Pt asked this RD which food items she would tolerate on clear liquid tray. Discussed rationale for clear liquid diet and foods available on diet. Encouraged pt to consume gingerale due to  prior vomiting.   Per [facility] notes, plan to continue TF @ 20 ml/hr related to vomiting and hold off on diet advancement until pt has a BM.  Labs reviewed: CBGS: 120-181.   Diet Order:  Diet clear liquid Room service appropriate? Yes; Fluid consistency: Thin  Skin:  Wound (see comment) (closed abdominal incision)  Last BM:  PTA  Height:   Ht Readings from Last 1 Encounters:  11/16/16 5\' 5"  (1.651 m)    Weight:   Wt Readings from Last 1 Encounters:  11/18/16 145 lb 3.2 oz (65.9 kg)    Ideal Body Weight:  56.8 kg  BMI:  Body mass index is 24.16 kg/m.  Estimated Nutritional Needs:   Kcal:  1600-1800  Protein:  80-90 gm  Fluid:  1.6-1.8 L  EDUCATION NEEDS:   No education needs identified at this time  Rielle Schlauch A. Jimmye Norman, RD, LDN, CDE Pager: [phone] After hours Pager: [phone]

## 2016-11-20 LAB — GLUCOSE, CAPILLARY
GLUCOSE-CAPILLARY: 166 mg/dL — AB (ref 65–99)
GLUCOSE-CAPILLARY: 120 mg/dL — AB (ref 65–99)
GLUCOSE-CAPILLARY: 155 mg/dL — AB (ref 65–99)
GLUCOSE-CAPILLARY: 134 mg/dL — AB (ref 65–99)
Glucose-Capillary: 119 mg/dL — ABNORMAL HIGH (ref 65–99)
Glucose-Capillary: 185 mg/dL — ABNORMAL HIGH (ref 65–99)

## 2016-11-20 MED ORDER — VITAL AF 1.2 CAL PO LIQD
1000.0000 mL | ORAL | Status: DC
  Administered 2016-11-20: 1000 mL
  Filled 2016-11-20 (×3): qty 1000

## 2016-11-20 MED ORDER — DOCUSATE SODIUM 100 MG PO CAPS
100.0000 mg | ORAL_CAPSULE | Freq: Two times a day (BID) | ORAL | Status: DC
  Administered 2016-11-20 – 2016-11-24 (×10): 100 mg via ORAL
  Filled 2016-11-20 (×11): qty 1

## 2016-11-20 NOTE — Progress Notes (Signed)
Patient ID: Rhonda Spencer, female   DOB: [birth year], 76 y.o.   MRN: [id number] 7 Days Post-Op [facility]   Subjective/Chief Complaint: Passing some gas, but no BM with suppository.  Would like some oatmeal.  No emesis yesterday.     Objective: Vital signs in last 24 hours: Temp:  [98.6 F (37 C)] 98.6 F (37 C) (08/16 0426) Pulse Rate:  [88] 88 (08/16 0426) Resp:  [16-17] 16 (08/16 0426) BP: (158-178)/(76-85) 178/76 (08/16 0426) SpO2:  [99 %] 99 % (08/16 0426) Last BM Date: 11/13/16  Intake/Output from previous day: 08/15 0701 - 08/16 0700 In: 18330.4 [I.V.:1708.8; RFXJO:83254; NG/GT:455.7] Out: 895 [Urine:800; Drains:95] Intake/Output this shift: No intake/output data recorded.  Exam: Looks comfortable Alert.  Breathing comfortably abd soft, drain serosang.  Midline incision c/d/i.    Lab Results:   Recent Labs  11/18/16 0532  WBC 3.8*  HGB 10.1*  HCT 30.1*  PLT 180   BMET  Recent Labs  11/18/16 0532  NA 134*  K 3.5  CL 104  CO2 25  GLUCOSE 166*  BUN 5*  CREATININE 0.61  CALCIUM 7.9*   PT/INR No results for input(s): LABPROT, INR in the last 72 hours. ABG No results for input(s): PHART, HCO3 in the last 72 hours.  Invalid input(s): PCO2, PO2  Studies/Results: No results found.  Anti-infectives: Anti-infectives    Start     Dose/Rate Route Frequency Ordered Stop   11/18/16 1830  erythromycin (EES) 400 MG/5ML suspension 200 mg     200 mg Oral Every 8 hours 11/18/16 1728     11/13/16 2100  ceFAZolin (ANCEF) IVPB 2g/100 mL premix     2 g 200 mL/hr over 30 Minutes Intravenous Every 8 hours 11/13/16 1953 11/13/16 2057   11/13/16 0622  ceFAZolin (ANCEF) 2-4 GM/100ML-% IVPB    Comments:  Tamsen Snider   : cabinet override      11/13/16 0622 11/13/16 0801   11/13/16 0618  ceFAZolin (ANCEF) IVPB 2g/100 mL premix     2 g 200 mL/hr over 30 Minutes Intravenous On call to O.R. 11/13/16 0618 11/13/16 0806      Assessment/Plan: s/p  Procedure(s): ATTEMPTED DAGNOSTIC LAPAROSCOPY (N/A) WHIPPLE PROCEDURE (N/A) PLACEMENT JEJUNOSTOMY TUBE (N/A)  Full liquids/soft solids as tolerated Increase tube feeds 30cc/hr   Erythromycin.  Repeat suppository.   LOS: 7 days    [facility] 11/20/2016

## 2016-11-21 LAB — GLUCOSE, CAPILLARY
GLUCOSE-CAPILLARY: 219 mg/dL — AB (ref 65–99)
Glucose-Capillary: 119 mg/dL — ABNORMAL HIGH (ref 65–99)
Glucose-Capillary: 154 mg/dL — ABNORMAL HIGH (ref 65–99)
Glucose-Capillary: 156 mg/dL — ABNORMAL HIGH (ref 65–99)
Glucose-Capillary: 174 mg/dL — ABNORMAL HIGH (ref 65–99)
Glucose-Capillary: 175 mg/dL — ABNORMAL HIGH (ref 65–99)

## 2016-11-21 MED ORDER — WHITE PETROLATUM GEL
Status: AC
  Administered 2016-11-21: 14:00:00
  Filled 2016-11-21: qty 1

## 2016-11-21 MED ORDER — VITAL AF 1.2 CAL PO LIQD
1000.0000 mL | ORAL | Status: DC
  Administered 2016-11-21 – 2016-11-28 (×4): 1000 mL
  Filled 2016-11-21 (×9): qty 1000

## 2016-11-21 MED ORDER — PANTOPRAZOLE SODIUM 40 MG PO TBEC
40.0000 mg | DELAYED_RELEASE_TABLET | Freq: Every day | ORAL | Status: DC
  Administered 2016-11-21 – 2016-11-24 (×4): 40 mg via ORAL
  Filled 2016-11-21 (×4): qty 1

## 2016-11-21 NOTE — Care Management Important Message (Signed)
Important Message  Patient Details  Name: Rhonda Spencer MRN: [id number] Date of Birth: 1940-08-28   Medicare Important Message Given:  Yes    Nathen May 11/21/2016, 11:23 AM

## 2016-11-21 NOTE — Progress Notes (Signed)
Patient ID: Rhonda Spencer, female   DOB: 29-Jan-1941, 76 y.o.   MRN: [id number] 8 Days Post-Op [facility]   Subjective/Chief Complaint: Continues to pass gas.  Had small BM.  Continues to get nauseated at night only.  Tolerated modest increase in tube feeds.     Objective: Vital signs in last 24 hours: Temp:  [98.1 F (36.7 C)-98.4 F (36.9 C)] 98.1 F (36.7 C) (08/17 1330) Pulse Rate:  [83-101] 101 (08/17 1330) Resp:  [16-18] 18 (08/17 1330) BP: (149-171)/(70-90) 166/90 (08/17 1330) SpO2:  [98 %-100 %] 100 % (08/17 1330) Last BM Date: 11/13/16  Intake/Output from previous day: 08/16 0701 - 08/17 0700 In: 2734.5 [P.O.:400; I.V.:1842.5; NG/GT:492] Out: 260 [Emesis/NG output:200; Drains:60] Intake/Output this shift: Total I/O In: 120 [P.O.:120] Out: -   Exam: Looks comfortable Alert.  Breathing comfortably abd soft, drain serosang.  Midline incision c/d/i.    Lab Results:  No results for input(s): WBC, HGB, HCT, PLT in the last 72 hours. BMET No results for input(s): NA, K, CL, CO2, GLUCOSE, BUN, CREATININE, CALCIUM in the last 72 hours. PT/INR No results for input(s): LABPROT, INR in the last 72 hours. ABG No results for input(s): PHART, HCO3 in the last 72 hours.  Invalid input(s): PCO2, PO2  Studies/Results: No results found.  Anti-infectives: Anti-infectives    Start     Dose/Rate Route Frequency Ordered Stop   11/18/16 1830  erythromycin (EES) 400 MG/5ML suspension 200 mg     200 mg Oral Every 8 hours 11/18/16 1728     11/13/16 2100  ceFAZolin (ANCEF) IVPB 2g/100 mL premix     2 g 200 mL/hr over 30 Minutes Intravenous Every 8 hours 11/13/16 1953 11/13/16 2057   11/13/16 0622  ceFAZolin (ANCEF) 2-4 GM/100ML-% IVPB    Comments:  Tamsen Snider   : cabinet override      11/13/16 0622 11/13/16 0801   11/13/16 0618  ceFAZolin (ANCEF) IVPB 2g/100 mL premix     2 g 200 mL/hr over 30 Minutes Intravenous On call to O.R. 11/13/16 0618 11/13/16 0806       Assessment/Plan: s/p Procedure(s): ATTEMPTED DAGNOSTIC LAPAROSCOPY (N/A) WHIPPLE PROCEDURE (N/A) PLACEMENT JEJUNOSTOMY TUBE (N/A)  Full liquids/soft solids as tolerated Increase tube feeds 40 ml//hr.  Go up by 5 ml/hr ever 8 hours to goal of 60 ml/hr.   Erythromycin.  Suppository daily.   LOS: 8 days    [facility] 11/21/2016

## 2016-11-21 NOTE — Progress Notes (Signed)
Patient having increased nausea. Compazine 10mg  IV given at 2111. Notified Dr Ninfa Linden; stated to give an extra dose of Compazine 10mg  IV at this time.

## 2016-11-22 ENCOUNTER — Inpatient Hospital Stay (HOSPITAL_COMMUNITY): Payer: Medicare Other

## 2016-11-22 LAB — COMPREHENSIVE METABOLIC PANEL
ALT: 17 U/L (ref 14–54)
AST: 24 U/L (ref 15–41)
Albumin: 2.7 g/dL — ABNORMAL LOW (ref 3.5–5.0)
Alkaline Phosphatase: 77 U/L (ref 38–126)
Anion gap: 10 (ref 5–15)
BUN: 11 mg/dL (ref 6–20)
CHLORIDE: 100 mmol/L — AB (ref 101–111)
CO2: 22 mmol/L (ref 22–32)
CREATININE: 0.58 mg/dL (ref 0.44–1.00)
Calcium: 8.3 mg/dL — ABNORMAL LOW (ref 8.9–10.3)
Glucose, Bld: 186 mg/dL — ABNORMAL HIGH (ref 65–99)
POTASSIUM: 4.5 mmol/L (ref 3.5–5.1)
SODIUM: 132 mmol/L — AB (ref 135–145)
Total Bilirubin: 1.4 mg/dL — ABNORMAL HIGH (ref 0.3–1.2)
Total Protein: 6.6 g/dL (ref 6.5–8.1)

## 2016-11-22 LAB — GLUCOSE, CAPILLARY
GLUCOSE-CAPILLARY: 161 mg/dL — AB (ref 65–99)
GLUCOSE-CAPILLARY: 97 mg/dL (ref 65–99)
GLUCOSE-CAPILLARY: 143 mg/dL — AB (ref 65–99)
GLUCOSE-CAPILLARY: 143 mg/dL — AB (ref 65–99)
Glucose-Capillary: 164 mg/dL — ABNORMAL HIGH (ref 65–99)
Glucose-Capillary: 171 mg/dL — ABNORMAL HIGH (ref 65–99)

## 2016-11-22 LAB — CBC
HCT: 34.2 % — ABNORMAL LOW (ref 36.0–46.0)
Hemoglobin: 11.6 g/dL — ABNORMAL LOW (ref 12.0–15.0)
MCH: 29.2 pg (ref 26.0–34.0)
MCHC: 33.9 g/dL (ref 30.0–36.0)
MCV: 86.1 fL (ref 78.0–100.0)
PLATELETS: ADEQUATE 10*3/uL (ref 150–400)
RBC: 3.97 MIL/uL (ref 3.87–5.11)
RDW: 13.1 % (ref 11.5–15.5)
WBC: 10.2 10*3/uL (ref 4.0–10.5)

## 2016-11-22 MED ORDER — ONDANSETRON HCL 4 MG/2ML IJ SOLN
4.0000 mg | Freq: Once | INTRAMUSCULAR | Status: AC
  Administered 2016-11-22: 4 mg via INTRAVENOUS
  Filled 2016-11-22: qty 2

## 2016-11-22 MED ORDER — ONDANSETRON HCL 4 MG/2ML IJ SOLN
4.0000 mg | INTRAMUSCULAR | Status: DC | PRN
  Administered 2016-11-23 – 2016-11-28 (×7): 4 mg via INTRAVENOUS
  Filled 2016-11-22 (×7): qty 2

## 2016-11-22 NOTE — Progress Notes (Signed)
Pt nauseated, MD consulted and  gave verbal order to leave feeding off for now

## 2016-11-22 NOTE — Progress Notes (Signed)
9 Days Post-Op   Subjective/Chief Complaint: Pt with con't n/v this AM.  Was NPO last night.  TFs off this AM    Objective: Vital signs in last 24 hours: Temp:  [98.1 F (36.7 C)-98.5 F (36.9 C)] 98.5 F (36.9 C) (08/18 0426) Pulse Rate:  [97-101] 97 (08/18 0426) Resp:  [17-18] 17 (08/18 0426) BP: (162-166)/(77-90) 164/89 (08/18 0426) SpO2:  [98 %-100 %] 98 % (08/18 0426) Weight:  [66.8 kg (147 lb 3.2 oz)] 66.8 kg (147 lb 3.2 oz) (08/17 2046) Last BM Date: 11/13/16  Intake/Output from previous day: 08/17 0701 - 08/18 0700 In: 2379.1 [P.O.:300; I.V.:1248.8; NG/GT:830.3] Out: 225 [Urine:50; Emesis/NG output:125; Drains:50] Intake/Output this shift: No intake/output data recorded.  General appearance: alert and cooperative Cardio: regular rate and rhythm, S1, S2 normal, no murmur, click, rub or gallop GI: soft, nttp, nd, incision c/d/i  Lab Results:   Recent Labs  11/22/16 0653  WBC 10.2  HGB 11.6*  HCT 34.2*  PLT PLATELET CLUMPS NOTED ON SMEAR, COUNT APPEARS ADEQUATE   BMET  Recent Labs  11/22/16 0653  NA 132*  K 4.5  CL 100*  CO2 22  GLUCOSE 186*  BUN 11  CREATININE 0.58  CALCIUM 8.3*   Anti-infectives: Anti-infectives    Start     Dose/Rate Route Frequency Ordered Stop   11/18/16 1830  erythromycin (EES) 400 MG/5ML suspension 200 mg     200 mg Oral Every 8 hours 11/18/16 1728     11/13/16 2100  ceFAZolin (ANCEF) IVPB 2g/100 mL premix     2 g 200 mL/hr over 30 Minutes Intravenous Every 8 hours 11/13/16 1953 11/13/16 2057   11/13/16 0622  ceFAZolin (ANCEF) 2-4 GM/100ML-% IVPB    Comments:  Tamsen Snider   : cabinet override      11/13/16 0622 11/13/16 0801   11/13/16 0618  ceFAZolin (ANCEF) IVPB 2g/100 mL premix     2 g 200 mL/hr over 30 Minutes Intravenous On call to O.R. 11/13/16 0618 11/13/16 0806      Assessment/Plan:  ATTEMPTED DAGNOSTIC LAPAROSCOPY (N/A) WHIPPLE PROCEDURE (N/A) PLACEMENT JEJUNOSTOMY TUBE (N/A)  NPO and TFs off for  now.  KUB pending Add zofran  Erythromycin.  Suppository daily.   LOS: 9 days    Rosario Jacks., [facility] 11/22/2016

## 2016-11-22 NOTE — Progress Notes (Signed)
Call Dr. Hulen Skains about Mrs. Wooton vomiting about 125cc of green vomite. Order was giving to give pt 4mg  Zofran and make in NPO. Also cut TF back down to 46mL/hr from 6mL/hr. Orders was carried out as ordered. Will continue to monitor pt.

## 2016-11-22 NOTE — Progress Notes (Signed)
Pt observed resting comfortably in bed with eyes closed. No signs of discomfort noted

## 2016-11-22 NOTE — Progress Notes (Signed)
MD gave a care order to keep tube feeding off today until order to restart feeding is given

## 2016-11-22 NOTE — Progress Notes (Signed)
Pt continues to vomit despite being medicated with both zofran and compazine. No abdominal distension or high residuals in gtube noted. Gtube stopped for now to see if nausea will subside

## 2016-11-23 LAB — CBC
HCT: 27.9 % — ABNORMAL LOW (ref 36.0–46.0)
Hemoglobin: 9.3 g/dL — ABNORMAL LOW (ref 12.0–15.0)
MCH: 28.4 pg (ref 26.0–34.0)
MCHC: 33.3 g/dL (ref 30.0–36.0)
MCV: 85.3 fL (ref 78.0–100.0)
PLATELETS: 339 10*3/uL (ref 150–400)
RBC: 3.27 MIL/uL — AB (ref 3.87–5.11)
RDW: 12.9 % (ref 11.5–15.5)
WBC: 8.2 10*3/uL (ref 4.0–10.5)

## 2016-11-23 LAB — COMPREHENSIVE METABOLIC PANEL
ALT: 15 U/L (ref 14–54)
AST: 20 U/L (ref 15–41)
Albumin: 2.1 g/dL — ABNORMAL LOW (ref 3.5–5.0)
Alkaline Phosphatase: 67 U/L (ref 38–126)
Anion gap: 8 (ref 5–15)
BILIRUBIN TOTAL: 1.2 mg/dL (ref 0.3–1.2)
BUN: 9 mg/dL (ref 6–20)
CALCIUM: 7.9 mg/dL — AB (ref 8.9–10.3)
CO2: 23 mmol/L (ref 22–32)
CREATININE: 0.65 mg/dL (ref 0.44–1.00)
Chloride: 102 mmol/L (ref 101–111)
GFR calc Af Amer: >60 mL/min (ref >60)
GLUCOSE: 119 mg/dL — AB (ref 65–99)
Potassium: 3.9 mmol/L (ref 3.5–5.1)
Sodium: 133 mmol/L — ABNORMAL LOW (ref 135–145)
TOTAL PROTEIN: 5.2 g/dL — AB (ref 6.5–8.1)

## 2016-11-23 LAB — GLUCOSE, CAPILLARY
Glucose-Capillary: 102 mg/dL — ABNORMAL HIGH (ref 65–99)
Glucose-Capillary: 104 mg/dL — ABNORMAL HIGH (ref 65–99)
Glucose-Capillary: 132 mg/dL — ABNORMAL HIGH (ref 65–99)
Glucose-Capillary: 139 mg/dL — ABNORMAL HIGH (ref 65–99)
Glucose-Capillary: 139 mg/dL — ABNORMAL HIGH (ref 65–99)
Glucose-Capillary: 147 mg/dL — ABNORMAL HIGH (ref 65–99)

## 2016-11-23 MED ORDER — VITAL AF 1.2 CAL PO LIQD
1000.0000 mL | ORAL | Status: DC
  Administered 2016-11-23: 1000 mL
  Filled 2016-11-23 (×4): qty 1000

## 2016-11-23 NOTE — Progress Notes (Signed)
10 Days Post-Op   Subjective/Chief Complaint: Pt with less nausea today and feels better No BM    Objective: Vital signs in last 24 hours: Temp:  [97.7 F (36.5 C)-98.7 F (37.1 C)] 98.7 F (37.1 C) (08/19 0444) Pulse Rate:  [87-95] 87 (08/19 0444) Resp:  [16-17] 16 (08/19 0444) BP: (146-164)/(63-73) 146/73 (08/19 0444) SpO2:  [96 %-97 %] 97 % (08/19 0444) Weight:  [69.8 kg (153 lb 12.8 oz)] 69.8 kg (153 lb 12.8 oz) (08/19 0444) Last BM Date: 11/13/16  Intake/Output from previous day: 08/18 0701 - 08/19 0700 In: 2452.5 [I.V.:2452.5] Out: 130 [Urine:100; Drains:30] Intake/Output this shift: No intake/output data recorded.  General appearance: alert and cooperative GI: soft, approp ttp, ND, active Bs, inciscion c/d/i   Lab Results:   Recent Labs  11/22/16 0653 11/23/16 0541  WBC 10.2 8.2  HGB 11.6* 9.3*  HCT 34.2* 27.9*  PLT PLATELET CLUMPS NOTED ON SMEAR, COUNT APPEARS ADEQUATE 339   BMET  Recent Labs  11/22/16 0653 11/23/16 0541  NA 132* 133*  K 4.5 3.9  CL 100* 102  CO2 22 23  GLUCOSE 186* 119*  BUN 11 9  CREATININE 0.58 0.65  CALCIUM 8.3* 7.9*   PT/INR No results for input(s): LABPROT, INR in the last 72 hours. ABG No results for input(s): PHART, HCO3 in the last 72 hours.  Invalid input(s): PCO2, PO2  Studies/Results: Dg Abd Portable 1v  Result Date: 11/22/2016 CLINICAL DATA:  Nausea and vomiting for 1 week. EXAM: PORTABLE ABDOMEN - 1 VIEW COMPARISON:  None. FINDINGS: Anterior skin staples are identified. Air-filled mildly prominent colon is identified. Evaluation for free air is limited due to supine imaging but none is depth seen. Surgical drains are identified in the abdomen. No other acute abnormalities. IMPRESSION: Probable postop ileus. Electronically Signed   By: Dorise Bullion III M.D   On: 11/22/2016 11:08    Anti-infectives: Anti-infectives    Start     Dose/Rate Route Frequency Ordered Stop   11/18/16 1830  erythromycin (EES)  400 MG/5ML suspension 200 mg     200 mg Oral Every 8 hours 11/18/16 1728     11/13/16 2100  ceFAZolin (ANCEF) IVPB 2g/100 mL premix     2 g 200 mL/hr over 30 Minutes Intravenous Every 8 hours 11/13/16 1953 11/13/16 2057   11/13/16 0622  ceFAZolin (ANCEF) 2-4 GM/100ML-% IVPB    Comments:  Tamsen Snider   : cabinet override      11/13/16 0622 11/13/16 0801   11/13/16 0618  ceFAZolin (ANCEF) IVPB 2g/100 mL premix     2 g 200 mL/hr over 30 Minutes Intravenous On call to O.R. 11/13/16 0618 11/13/16 0806      Assessment/Plan: s/p Procedure(s): ATTEMPTED DAGNOSTIC LAPAROSCOPY (N/A) WHIPPLE PROCEDURE (N/A) PLACEMENT JEJUNOSTOMY TUBE (N/A) Will restart TFs today slowly -20cc/h Mobilize as tol   LOS: 10 days    Rosario Jacks., [facility] 11/23/2016

## 2016-11-23 NOTE — Progress Notes (Signed)
Pt vomiting(white/orange), pt requesting TFs to be stopped. Dr. Hulen Skains notified. TFs on hold at this time.

## 2016-11-24 LAB — COMPREHENSIVE METABOLIC PANEL
ALBUMIN: 2.2 g/dL — AB (ref 3.5–5.0)
ALK PHOS: 58 U/L (ref 38–126)
ALT: 14 U/L (ref 14–54)
AST: 20 U/L (ref 15–41)
Anion gap: 5 (ref 5–15)
BUN: 5 mg/dL — AB (ref 6–20)
CALCIUM: 7.8 mg/dL — AB (ref 8.9–10.3)
CO2: 27 mmol/L (ref 22–32)
CREATININE: 0.66 mg/dL (ref 0.44–1.00)
Chloride: 102 mmol/L (ref 101–111)
GFR calc non Af Amer: >60 mL/min (ref >60)
GLUCOSE: 125 mg/dL — AB (ref 65–99)
Potassium: 4.1 mmol/L (ref 3.5–5.1)
SODIUM: 134 mmol/L — AB (ref 135–145)
Total Bilirubin: 1.3 mg/dL — ABNORMAL HIGH (ref 0.3–1.2)
Total Protein: 5.3 g/dL — ABNORMAL LOW (ref 6.5–8.1)

## 2016-11-24 LAB — GLUCOSE, CAPILLARY
GLUCOSE-CAPILLARY: 134 mg/dL — AB (ref 65–99)
GLUCOSE-CAPILLARY: 64 mg/dL — AB (ref 65–99)
GLUCOSE-CAPILLARY: 71 mg/dL (ref 65–99)
Glucose-Capillary: 101 mg/dL — ABNORMAL HIGH (ref 65–99)
Glucose-Capillary: 118 mg/dL — ABNORMAL HIGH (ref 65–99)
Glucose-Capillary: 120 mg/dL — ABNORMAL HIGH (ref 65–99)
Glucose-Capillary: 99 mg/dL (ref 65–99)

## 2016-11-24 LAB — CBC
HCT: 27.7 % — ABNORMAL LOW (ref 36.0–46.0)
HEMOGLOBIN: 9.2 g/dL — AB (ref 12.0–15.0)
MCH: 28.4 pg (ref 26.0–34.0)
MCHC: 33.2 g/dL (ref 30.0–36.0)
MCV: 85.5 fL (ref 78.0–100.0)
Platelets: 357 10*3/uL (ref 150–400)
RBC: 3.24 MIL/uL — AB (ref 3.87–5.11)
RDW: 12.8 % (ref 11.5–15.5)
WBC: 6.4 10*3/uL (ref 4.0–10.5)

## 2016-11-24 NOTE — Progress Notes (Signed)
11 Days Post-Op   Subjective/Chief Complaint: Pt had nausea overnight once tube feeds restarted.  Threw up.  Had a reasonable BM yesterday and had felt better at first.      Objective: Vital signs in last 24 hours: Temp:  [98.4 F (36.9 C)-98.7 F (37.1 C)] 98.6 F (37 C) (08/20 0500) Pulse Rate:  [72-77] 72 (08/20 0500) Resp:  [16] 16 (08/20 0500) BP: (156-168)/(64-68) 156/64 (08/20 0500) SpO2:  [98 %] 98 % (08/20 0500) Weight:  [69.6 kg (153 lb 7 oz)] 69.6 kg (153 lb 7 oz) (08/20 0500) Last BM Date: 11/23/16  Intake/Output from previous day: 08/19 0701 - 08/20 0700 In: 870 [I.V.:750; NG/GT:60] Out: 21 [Drains:20; Stool:1] Intake/Output this shift: No intake/output data recorded.  General appearance: alert and cooperative GI: soft, approp ttp, ND, active Bs, inciscion c/d/i   Lab Results:   Recent Labs  11/23/16 0541 11/24/16 0518  WBC 8.2 6.4  HGB 9.3* 9.2*  HCT 27.9* 27.7*  PLT 339 357   BMET  Recent Labs  11/23/16 0541 11/24/16 0518  NA 133* 134*  K 3.9 4.1  CL 102 102  CO2 23 27  GLUCOSE 119* 125*  BUN 9 5*  CREATININE 0.65 0.66  CALCIUM 7.9* 7.8*   PT/INR No results for input(s): LABPROT, INR in the last 72 hours. ABG No results for input(s): PHART, HCO3 in the last 72 hours.  Invalid input(s): PCO2, PO2  Studies/Results: Dg Abd Portable 1v  Result Date: 11/22/2016 CLINICAL DATA:  Nausea and vomiting for 1 week. EXAM: PORTABLE ABDOMEN - 1 VIEW COMPARISON:  None. FINDINGS: Anterior skin staples are identified. Air-filled mildly prominent colon is identified. Evaluation for free air is limited due to supine imaging but none is depth seen. Surgical drains are identified in the abdomen. No other acute abnormalities. IMPRESSION: Probable postop ileus. Electronically Signed   By: Dorise Bullion III M.D   On: 11/22/2016 11:08    Anti-infectives: Anti-infectives    Start     Dose/Rate Route Frequency Ordered Stop   11/18/16 1830  erythromycin  (EES) 400 MG/5ML suspension 200 mg     200 mg Oral Every 8 hours 11/18/16 1728     11/13/16 2100  ceFAZolin (ANCEF) IVPB 2g/100 mL premix     2 g 200 mL/hr over 30 Minutes Intravenous Every 8 hours 11/13/16 1953 11/13/16 2057   11/13/16 0622  ceFAZolin (ANCEF) 2-4 GM/100ML-% IVPB    Comments:  Tamsen Snider   : cabinet override      11/13/16 0622 11/13/16 0801   11/13/16 0618  ceFAZolin (ANCEF) IVPB 2g/100 mL premix     2 g 200 mL/hr over 30 Minutes Intravenous On call to O.R. 11/13/16 3267 11/13/16 0806      Assessment/Plan: s/p Procedure(s): ATTEMPTED DAGNOSTIC LAPAROSCOPY (N/A) WHIPPLE PROCEDURE (N/A) PLACEMENT JEJUNOSTOMY TUBE (N/A) Try soft diet today. Staples out this week.      LOS: 11 days    Rhonda Spencer 11/24/2016

## 2016-11-25 LAB — GLUCOSE, CAPILLARY
GLUCOSE-CAPILLARY: 112 mg/dL — AB (ref 65–99)
GLUCOSE-CAPILLARY: 85 mg/dL (ref 65–99)
GLUCOSE-CAPILLARY: 94 mg/dL (ref 65–99)
Glucose-Capillary: 102 mg/dL — ABNORMAL HIGH (ref 65–99)
Glucose-Capillary: 127 mg/dL — ABNORMAL HIGH (ref 65–99)
Glucose-Capillary: 133 mg/dL — ABNORMAL HIGH (ref 65–99)
Glucose-Capillary: 135 mg/dL — ABNORMAL HIGH (ref 65–99)

## 2016-11-25 MED ORDER — OXYCODONE HCL 5 MG PO TABS: 2.5000 mg | ORAL_TABLET | ORAL | Status: DC | PRN

## 2016-11-25 MED ORDER — PANTOPRAZOLE SODIUM 40 MG PO TBEC
40.0000 mg | DELAYED_RELEASE_TABLET | Freq: Two times a day (BID) | ORAL | Status: DC
  Administered 2016-11-25 – 2016-12-04 (×17): 40 mg via ORAL
  Filled 2016-11-25 (×19): qty 1

## 2016-11-25 MED ORDER — PREMIER PROTEIN SHAKE
11.0000 [oz_av] | Freq: Three times a day (TID) | ORAL | Status: DC
  Administered 2016-11-25 – 2016-11-26 (×2): 11 [oz_av] via ORAL
  Filled 2016-11-25 (×7): qty 325.31

## 2016-11-25 MED ORDER — ACETAMINOPHEN 325 MG PO TABS
650.0000 mg | ORAL_TABLET | Freq: Four times a day (QID) | ORAL | Status: DC
  Administered 2016-11-25 – 2016-11-30 (×14): 650 mg via ORAL
  Filled 2016-11-25 (×15): qty 2

## 2016-11-25 MED ORDER — PANCRELIPASE (LIP-PROT-AMYL) 12000-38000 UNITS PO CPEP
36000.0000 [IU] | ORAL_CAPSULE | Freq: Three times a day (TID) | ORAL | Status: DC
  Administered 2016-11-25 – 2016-11-29 (×12): 36000 [IU] via ORAL
  Filled 2016-11-25 (×14): qty 3

## 2016-11-25 NOTE — Progress Notes (Signed)
Nutrition Follow-up  DOCUMENTATION CODES:   Non-severe (moderate) malnutrition in context of chronic illness  INTERVENTION:   -Continue 48 hour calorie count; follow-up on 11/26/16 -Premier Protein TID  NUTRITION DIAGNOSIS:   Malnutrition (moderate) related to chronic illness (pancreatic cancer) as evidenced by mild depletion of muscle mass, mild depletion of body fat, percent weight loss, energy intake < 75% for > or equal to 1 month.  Ongoing  GOAL:   Patient will meet greater than or equal to 90% of their needs  Progressing  MONITOR:   PO intake, Supplement acceptance, Labs, Weight trends, Skin, I & O's  REASON FOR ASSESSMENT:   Malnutrition Screening Tool    ASSESSMENT:   76 yo female with PMH of rheumatoid arthritis, HTN, rheumatic heart disease, mitral regurgitation, aortic insufficiency who was admitted on 8/9 with pancreatic cancer. S/P Whipple procedure with placement of jejunostomy on 8/9.  8/12- NGT removed, CLD started, x-fer from ICU to floor 8/13- TF (Vital AF 1.2) increased to 20 ml/hr (providing 576 kcals, 36 grams protein, and 406 ml free water- meeting 36% of estimated energy needs and 45% of estimated protein intake) 8/18- TF held due to nausea and vomiting 8/19- TF restarted, stopped due to vomiting 8/20- advanced to a soft diet  Pt receiving nursing care at time of visit.   48 hour calorie count initiated by MD this morning. Pt eating small amounts of soft diet- consumed 265 kcals and 6 grams of protein yesterday (about 20% of lunch and dinner meals). RD will add supplements to assist with optimizing PO's due to TF off.   Labs reviewed: CBGS: 71-133.   Diet Order:  DIET SOFT Room service appropriate? Yes; Fluid consistency: Thin  Skin:   (closed abdominal incision)  Last BM:  11/24/16  Height:   Ht Readings from Last 1 Encounters:  11/16/16 5\' 5"  (1.651 m)    Weight:   Wt Readings from Last 1 Encounters:  11/24/16 153 lb 7 oz (69.6 kg)     Ideal Body Weight:  56.8 kg  BMI:  Body mass index is 25.53 kg/m.  Estimated Nutritional Needs:   Kcal:  1600-1800  Protein:  80-90 gm  Fluid:  1.6-1.8 L  EDUCATION NEEDS:   No education needs identified at this time  Rhonda Spencer A. Jimmye Norman, RD, LDN, CDE Pager: [phone] After hours Pager: [phone]

## 2016-11-25 NOTE — Care Management Important Message (Signed)
Important Message  Patient Details  Name: Rhonda Spencer MRN: [id number] Date of Birth: Dec 27, 1940   Medicare Important Message Given:  Yes    Orbie Pyo 11/25/2016, 12:23 PM

## 2016-11-25 NOTE — Progress Notes (Signed)
Called Dr. Redmond Pulling about Rhonda Spencer drain being broken. MD stated will make rounds the morning to assess drain. RN will continue to monitor.

## 2016-11-25 NOTE — Progress Notes (Signed)
12 Days Post-Op   Subjective/Chief Complaint: Did reasonably well with real food.  No n/v.  C/o diarrhea.      Objective: Vital signs in last 24 hours: Temp:  [98.1 F (36.7 C)-98.7 F (37.1 C)] 98.1 F (36.7 C) (08/21 0358) Pulse Rate:  [72-86] 85 (08/21 0358) Resp:  [17-18] 17 (08/21 0358) BP: (157-182)/(75-80) 182/78 (08/21 0358) SpO2:  [99 %-100 %] 100 % (08/21 0358) Last BM Date: 11/24/16  Intake/Output from previous day: 08/20 0701 - 08/21 0700 In: 1862.5 [P.O.:240; I.V.:1622.5] Out: 16 [Drains:15; Stool:1] Intake/Output this shift: No intake/output data recorded.  General appearance: alert and cooperative GI: soft, approp ttp, ND, active Bs, inciscion c/d/i   Lab Results:   Recent Labs  11/23/16 0541 11/24/16 0518  WBC 8.2 6.4  HGB 9.3* 9.2*  HCT 27.9* 27.7*  PLT 339 357   BMET  Recent Labs  11/23/16 0541 11/24/16 0518  NA 133* 134*  K 3.9 4.1  CL 102 102  CO2 23 27  GLUCOSE 119* 125*  BUN 9 5*  CREATININE 0.65 0.66  CALCIUM 7.9* 7.8*   PT/INR No results for input(s): LABPROT, INR in the last 72 hours. ABG No results for input(s): PHART, HCO3 in the last 72 hours.  Invalid input(s): PCO2, PO2  Studies/Results: No results found.  Anti-infectives: Anti-infectives    Start     Dose/Rate Route Frequency Ordered Stop   11/18/16 1830  erythromycin (EES) 400 MG/5ML suspension 200 mg  Status:  Discontinued     200 mg Oral Every 8 hours 11/18/16 1728 11/24/16 1251   11/13/16 2100  ceFAZolin (ANCEF) IVPB 2g/100 mL premix     2 g 200 mL/hr over 30 Minutes Intravenous Every 8 hours 11/13/16 1953 11/13/16 2057   11/13/16 0622  ceFAZolin (ANCEF) 2-4 GM/100ML-% IVPB    Comments:  Tamsen Snider   : cabinet override      11/13/16 0622 11/13/16 0801   11/13/16 0618  ceFAZolin (ANCEF) IVPB 2g/100 mL premix     2 g 200 mL/hr over 30 Minutes Intravenous On call to O.R. 11/13/16 9381 11/13/16 0806      Assessment/Plan: s/p  Procedure(s): ATTEMPTED DAGNOSTIC LAPAROSCOPY (N/A) WHIPPLE PROCEDURE (N/A) PLACEMENT JEJUNOSTOMY TUBE (N/A) Soft diet. D/c staples Thursday Calorie counts protonix bid Oral pain meds.    LOS: 12 days    Rhonda Spencer 11/25/2016

## 2016-11-26 ENCOUNTER — Ambulatory Visit: Payer: Medicare Other | Admitting: Student

## 2016-11-26 LAB — GLUCOSE, CAPILLARY
GLUCOSE-CAPILLARY: 128 mg/dL — AB (ref 65–99)
GLUCOSE-CAPILLARY: 61 mg/dL — AB (ref 65–99)
GLUCOSE-CAPILLARY: 97 mg/dL (ref 65–99)
GLUCOSE-CAPILLARY: 99 mg/dL (ref 65–99)
Glucose-Capillary: 116 mg/dL — ABNORMAL HIGH (ref 65–99)
Glucose-Capillary: 70 mg/dL (ref 65–99)
Glucose-Capillary: 81 mg/dL (ref 65–99)

## 2016-11-26 MED ORDER — ENSURE ENLIVE PO LIQD
237.0000 mL | Freq: Three times a day (TID) | ORAL | Status: DC
  Administered 2016-11-26 – 2016-11-27 (×5): 237 mL via ORAL

## 2016-11-26 NOTE — Progress Notes (Addendum)
Calorie Count Note  48 hour calorie count ordered.  Diet: Soft Supplements: Premier Protein TID  Case discussed with RN and NT, who reports pt is only consuming small bites and sips.   Pt sleeping soundly at time of visit. Spoke with pt daughter and bedside, who confirmed pt had a difficult day yesterday, due to nausea and vomiting. She shares pt is afraid to eat due "fear that it will come back up". Per daughter, pt prefers consuming liquids instead of solid foods at this time. Pt consumed 100% of orange juice and can of sprite at breakfast (120 kcals, 1 gram protein). Pt consumed a Premier Protein shake last evening, but daughter unsure if pt tolerated well (noted two Premier Protein supplements, unopened, at bedside). Pt consumes strawberry Ensure at home and pt daughter believes she may tolerate this supplement better due to familiarity of product.   8/21 Can of Sprite (60 kcals, 0 grams protein) Supplements: 160 kcals, 30 grams protein  Total intake: 220 kcal (14% of minimum estimated needs)  30 protein (38% of minimum estimated needs)  Nutrition Dx: Malnutrition (moderate) related to chronic illness (pancreatic cancer) as evidenced by mild depletion of muscle mass, mild depletion of body fat, percent weight loss, energy intake < 75% for > or equal to 1 month; ongoing  Goal: Patient will meet greater than or equal to 90% of their needs; unmet  Intervention:   -Continue with calorie count, will follow-up on 11/27/16 for further analysis -D/c Premier Protein TID -Ensure Enlive po TID, each supplement provides 350 kcal and 20 grams of protein  Hardeep Reetz A. Jimmye Norman, RD, LDN, CDE Pager: [phone] After hours Pager: [phone]

## 2016-11-26 NOTE — Progress Notes (Signed)
13 Days Post-Op   Subjective/Chief Complaint: Continuing to eat OK.  Minimal n/v.  Passing gas.  Drains removed.      Objective: Vital signs in last 24 hours: Temp:  [97.8 F (36.6 C)-98.2 F (36.8 C)] 97.8 F (36.6 C) (08/22 0431) Pulse Rate:  [78-83] 83 (08/22 0431) Resp:  [17-18] 17 (08/22 0431) BP: (148-169)/(56-80) 169/75 (08/22 0431) SpO2:  [100 %] 100 % (08/22 0431) Weight:  [67.8 kg (149 lb 6.4 oz)] 67.8 kg (149 lb 6.4 oz) (08/22 0431) Last BM Date: 11/25/16  Intake/Output from previous day: 08/21 0701 - 08/22 0700 In: 1380 [P.O.:180; I.V.:1200] Out: 400 [Urine:400] Intake/Output this shift: No intake/output data recorded.  General appearance: alert and cooperative GI: soft, approp ttp, ND, active Bs, inciscion c/d/i   Lab Results:   Recent Labs  11/24/16 0518  WBC 6.4  HGB 9.2*  HCT 27.7*  PLT 357   BMET  Recent Labs  11/24/16 0518  NA 134*  K 4.1  CL 102  CO2 27  GLUCOSE 125*  BUN 5*  CREATININE 0.66  CALCIUM 7.8*   PT/INR No results for input(s): LABPROT, INR in the last 72 hours. ABG No results for input(s): PHART, HCO3 in the last 72 hours.  Invalid input(s): PCO2, PO2  Studies/Results: No results found.  Anti-infectives: Anti-infectives    Start     Dose/Rate Route Frequency Ordered Stop   11/18/16 1830  erythromycin (EES) 400 MG/5ML suspension 200 mg  Status:  Discontinued     200 mg Oral Every 8 hours 11/18/16 1728 11/24/16 1251   11/13/16 2100  ceFAZolin (ANCEF) IVPB 2g/100 mL premix     2 g 200 mL/hr over 30 Minutes Intravenous Every 8 hours 11/13/16 1953 11/13/16 2057   11/13/16 0622  ceFAZolin (ANCEF) 2-4 GM/100ML-% IVPB    Comments:  Tamsen Snider   : cabinet override      11/13/16 0622 11/13/16 0801   11/13/16 0618  ceFAZolin (ANCEF) IVPB 2g/100 mL premix     2 g 200 mL/hr over 30 Minutes Intravenous On call to O.R. 11/13/16 8841 11/13/16 0806      Assessment/Plan: s/p Procedure(s): ATTEMPTED DAGNOSTIC  LAPAROSCOPY (N/A) WHIPPLE PROCEDURE (N/A) PLACEMENT JEJUNOSTOMY TUBE (N/A) Soft diet. D/c staples Saline lock IVF. D/c tele Calorie counts protonix bid Oral pain meds.    LOS: 13 days    Rhonda Spencer 11/26/2016

## 2016-11-27 LAB — COMPREHENSIVE METABOLIC PANEL
ALK PHOS: 157 U/L — AB (ref 38–126)
ALT: 19 U/L (ref 14–54)
AST: 31 U/L (ref 15–41)
Albumin: 2.4 g/dL — ABNORMAL LOW (ref 3.5–5.0)
Anion gap: 10 (ref 5–15)
BILIRUBIN TOTAL: 1.6 mg/dL — AB (ref 0.3–1.2)
BUN: 6 mg/dL (ref 6–20)
CALCIUM: 8 mg/dL — AB (ref 8.9–10.3)
CHLORIDE: 99 mmol/L — AB (ref 101–111)
CO2: 23 mmol/L (ref 22–32)
CREATININE: 0.79 mg/dL (ref 0.44–1.00)
GFR calc Af Amer: >60 mL/min (ref >60)
Glucose, Bld: 85 mg/dL (ref 65–99)
Potassium: 3.9 mmol/L (ref 3.5–5.1)
Sodium: 132 mmol/L — ABNORMAL LOW (ref 135–145)
TOTAL PROTEIN: 5.6 g/dL — AB (ref 6.5–8.1)

## 2016-11-27 LAB — GLUCOSE, CAPILLARY
GLUCOSE-CAPILLARY: 127 mg/dL — AB (ref 65–99)
GLUCOSE-CAPILLARY: 78 mg/dL (ref 65–99)
Glucose-Capillary: 76 mg/dL (ref 65–99)
Glucose-Capillary: 109 mg/dL — ABNORMAL HIGH (ref 65–99)
Glucose-Capillary: 128 mg/dL — ABNORMAL HIGH (ref 65–99)
Glucose-Capillary: 130 mg/dL — ABNORMAL HIGH (ref 65–99)

## 2016-11-27 LAB — CBC
HEMATOCRIT: 30.9 % — AB (ref 36.0–46.0)
HEMOGLOBIN: 10 g/dL — AB (ref 12.0–15.0)
MCH: 27.9 pg (ref 26.0–34.0)
MCHC: 32.4 g/dL (ref 30.0–36.0)
MCV: 86.3 fL (ref 78.0–100.0)
Platelets: 445 10*3/uL — ABNORMAL HIGH (ref 150–400)
RBC: 3.58 MIL/uL — AB (ref 3.87–5.11)
RDW: 12.8 % (ref 11.5–15.5)
WBC: 9.7 10*3/uL (ref 4.0–10.5)

## 2016-11-27 MED ORDER — PREDNISONE 50 MG PO TABS: 50.0000 mg | ORAL_TABLET | Freq: Four times a day (QID) | ORAL | Status: DC

## 2016-11-27 MED ORDER — DIPHENHYDRAMINE HCL 25 MG PO CAPS: 50.0000 mg | ORAL_CAPSULE | Freq: Once | ORAL | Status: DC

## 2016-11-27 MED ORDER — DIPHENHYDRAMINE HCL 50 MG/ML IJ SOLN: 50.0000 mg | Freq: Once | INTRAMUSCULAR | Status: DC

## 2016-11-27 NOTE — Care Management Note (Signed)
Case Management Note  Patient Details  Name: WRENLY LAURITSEN MRN: [id number] Date of Birth: 06-Jun-1940  Subjective/Objective:                 Spoke w patient at the bedside. She states she recently moved in with her daughter, granddaughter great grandson and other granddaughter, 2 adults and 2 small children. She states she has 24 hour supervision there. She states her son and his two grown sons live nearby as well. She describes her room as on the lower level, down stairs from kitchen and it has same level bath. She states she has a RW and cane. She denies difficulties getting to appointments or obtaining meds. She states she has been ambulating with staff with RW w/o difficulties.  Home address [address] [place] [address]   Action/Plan:  CM will continue to follow for DC planning.  Expected Discharge Date:                  Expected Discharge Plan:  [place]  In-House Referral:     Discharge planning Services  CM Consult  Post Acute Care Choice:    Choice offered to:     DME Arranged:    DME Agency:     HH Arranged:    [place] Agency:     Status of Service:  In process, will continue to follow  If discussed at Long Length of Stay Meetings, dates discussed:    Additional Comments:  Carles Collet, RN 11/27/2016, 1:31 PM

## 2016-11-27 NOTE — Progress Notes (Signed)
14 Days Post-Op   Subjective/Chief Complaint: Calorie counts are very low.  Tube feeds restarted today.    Objective: Vital signs in last 24 hours: Temp:  [97.4 F (36.3 C)-98.2 F (36.8 C)] 97.6 F (36.4 C) (08/23 1345) Pulse Rate:  [74-115] 74 (08/23 1345) Resp:  [16] 16 (08/23 1345) BP: (128-178)/(64-95) 150/64 (08/23 1345) SpO2:  [100 %] 100 % (08/23 1345) Last BM Date: 11/25/16  Intake/Output from previous day: 08/22 0701 - 08/23 0700 In: 357 [P.O.:357] Out: -  Intake/Output this shift: Total I/O In: 260 [P.O.:260] Out: -   General appearance: alert and cooperative GI: soft, approp ttp, ND, active Bs, inciscion c/d/i   Lab Results:   Recent Labs  11/27/16 0553  WBC 9.7  HGB 10.0*  HCT 30.9*  PLT 445*   BMET  Recent Labs  11/27/16 0553  NA 132*  K 3.9  CL 99*  CO2 23  GLUCOSE 85  BUN 6  CREATININE 0.79  CALCIUM 8.0*   PT/INR No results for input(s): LABPROT, INR in the last 72 hours. ABG No results for input(s): PHART, HCO3 in the last 72 hours.  Invalid input(s): PCO2, PO2  Studies/Results: No results found.  Anti-infectives: Anti-infectives    Start     Dose/Rate Route Frequency Ordered Stop   11/18/16 1830  erythromycin (EES) 400 MG/5ML suspension 200 mg  Status:  Discontinued     200 mg Oral Every 8 hours 11/18/16 1728 11/24/16 1251   11/13/16 2100  ceFAZolin (ANCEF) IVPB 2g/100 mL premix     2 g 200 mL/hr over 30 Minutes Intravenous Every 8 hours 11/13/16 1953 11/13/16 2057   11/13/16 0622  ceFAZolin (ANCEF) 2-4 GM/100ML-% IVPB    Comments:  Tamsen Snider   : cabinet override      11/13/16 0622 11/13/16 0801   11/13/16 0618  ceFAZolin (ANCEF) IVPB 2g/100 mL premix     2 g 200 mL/hr over 30 Minutes Intravenous On call to O.R. 11/13/16 2637 11/13/16 0806      Assessment/Plan: s/p Procedure(s): ATTEMPTED DAGNOSTIC LAPAROSCOPY (N/A) WHIPPLE PROCEDURE (N/A) PLACEMENT JEJUNOSTOMY TUBE (N/A) Soft diet as tolerated. Tube  feeds.  Calorie counts protonix bid Oral pain meds.    LOS: 14 days    Sibel Khurana 11/27/2016

## 2016-11-27 NOTE — Progress Notes (Signed)
Calorie Count Note  48 hour calorie count ordered.  Diet: Soft Supplements: Ensure Enlive po TID, each supplement provides 350 kcal and 20 grams of protein  Pt meeting less than 50% needs. Noted TF restarted this afternoon by RN. Vital AF 1.2 currently infusing via j-tube at 40 ml/hr- currently providing 1152 kcals (72% of estimated kcal needs), 72 grams protein (90% of estimated needs), and 779 ml free water daily.   8/21 Can of Sprite (60 kcals, 0 grams protein) Supplements: 160 kcals, 30 grams protein  Total intake: 220 kcal (14% of minimum estimated needs)  30 grams protein (38% of minimum estimated needs)  8/22 Breakfast: 0% Lunch: 0% Dinner: 0% Supplements: 2 Ensure supplements (700 kcals, 40 grams protein)  Total intake: 700 kcal (44% of minimum estimated needs)  40 grams protein (50% of minimum estimated needs)  Average Total intake: 460 kcal (29% of minimum estimated needs)  30 grams protein (38% of minimum estimated needs)  Nutrition Dx: Malnutrition (moderate)related to chronic illness (pancreatic cancer)as evidenced by mild depletion of muscle mass, mild depletion of body fat, percent weight loss, energy intake < 75% for > or equal to 1 month; ongoing  Goal: Patient will meet greater than or equal to 90% of their needs; unmet  Intervention:   -D/c calorie count -Continue Ensure Enlive po TID, each supplement provides 350 kcal and 20 grams of protein -Continue Continue Vital AF 1.2 at 40 ml/hr via j-tube. Recmmend advance slowly to goal rate of 60 ml/hr, per MD discretion. At goal rate, to provide 1728 kcal, 108 gm protein, 1168 ml free water daily  Chloeann Alfred A. Jimmye Norman, RD, LDN, CDE Pager: [phone] After hours Pager: [phone]

## 2016-11-28 LAB — GLUCOSE, CAPILLARY
Glucose-Capillary: 143 mg/dL — ABNORMAL HIGH (ref 65–99)
Glucose-Capillary: 143 mg/dL — ABNORMAL HIGH (ref 65–99)
Glucose-Capillary: 123 mg/dL — ABNORMAL HIGH (ref 65–99)
Glucose-Capillary: 105 mg/dL — ABNORMAL HIGH (ref 65–99)
Glucose-Capillary: 138 mg/dL — ABNORMAL HIGH (ref 65–99)

## 2016-11-28 LAB — CBC
HCT: 30.7 % — ABNORMAL LOW (ref 36.0–46.0)
Hemoglobin: 10 g/dL — ABNORMAL LOW (ref 12.0–15.0)
MCH: 28 pg (ref 26.0–34.0)
MCHC: 32.6 g/dL (ref 30.0–36.0)
MCV: 86 fL (ref 78.0–100.0)
PLATELETS: 471 10*3/uL — AB (ref 150–400)
RBC: 3.57 MIL/uL — ABNORMAL LOW (ref 3.87–5.11)
RDW: 12.9 % (ref 11.5–15.5)
WBC: 3.7 10*3/uL — AB (ref 4.0–10.5)

## 2016-11-28 LAB — COMPREHENSIVE METABOLIC PANEL
ALT: 15 U/L (ref 14–54)
ANION GAP: 8 (ref 5–15)
AST: 23 U/L (ref 15–41)
Albumin: 2.4 g/dL — ABNORMAL LOW (ref 3.5–5.0)
Alkaline Phosphatase: 126 U/L (ref 38–126)
BUN: 10 mg/dL (ref 6–20)
CHLORIDE: 99 mmol/L — AB (ref 101–111)
CO2: 27 mmol/L (ref 22–32)
Calcium: 8.1 mg/dL — ABNORMAL LOW (ref 8.9–10.3)
Creatinine, Ser: 0.71 mg/dL (ref 0.44–1.00)
Glucose, Bld: 146 mg/dL — ABNORMAL HIGH (ref 65–99)
POTASSIUM: 3.7 mmol/L (ref 3.5–5.1)
Sodium: 134 mmol/L — ABNORMAL LOW (ref 135–145)
Total Bilirubin: 1.1 mg/dL (ref 0.3–1.2)
Total Protein: 5.7 g/dL — ABNORMAL LOW (ref 6.5–8.1)

## 2016-11-28 MED ORDER — VITAL AF 1.2 CAL PO LIQD
1000.0000 mL | ORAL | Status: DC
Start: 2016-11-28 — End: 2016-11-29
  Administered 2016-11-28 – 2016-11-29 (×2): 1000 mL
  Filled 2016-11-28 (×3): qty 1000

## 2016-11-28 MED ORDER — VITAL AF 1.2 CAL PO LIQD
1000.0000 mL | ORAL | Status: DC
  Filled 2016-11-28: qty 1000

## 2016-11-28 NOTE — Progress Notes (Signed)
15 Days Post-Op   Subjective/Chief Complaint: Feeling very full and nauseated today.  TF at 30 ml/hr.      Objective: Vital signs in last 24 hours: Temp:  [97.6 F (36.4 C)-98.9 F (37.2 C)] 98.9 F (37.2 C) (08/24 0554) Pulse Rate:  [74-89] 89 (08/24 0554) Resp:  [16-17] 16 (08/24 0554) BP: (150-164)/(64-75) 164/75 (08/24 0554) SpO2:  [100 %] 100 % (08/24 0554) Weight:  [67.5 kg (148 lb 13 oz)] 67.5 kg (148 lb 13 oz) (08/23 1345) Last BM Date: 11/28/16  Intake/Output from previous day: 08/23 0701 - 08/24 0700 In: 1170 [P.O.:740; NG/GT:430] Out: -  Intake/Output this shift: Total I/O In: 360 [P.O.:360] Out: -   General appearance: alert and cooperative GI: soft, approp ttp, ND, active Bs, inciscion c/d/i   Lab Results:   Recent Labs  11/27/16 0553 11/28/16 0559  WBC 9.7 3.7*  HGB 10.0* 10.0*  HCT 30.9* 30.7*  PLT 445* 471*   BMET  Recent Labs  11/27/16 0553 11/28/16 0559  NA 132* 134*  K 3.9 3.7  CL 99* 99*  CO2 23 27  GLUCOSE 85 146*  BUN 6 10  CREATININE 0.79 0.71  CALCIUM 8.0* 8.1*   PT/INR No results for input(s): LABPROT, INR in the last 72 hours. ABG No results for input(s): PHART, HCO3 in the last 72 hours.  Invalid input(s): PCO2, PO2  Studies/Results: No results found.  Anti-infectives: Anti-infectives    Start     Dose/Rate Route Frequency Ordered Stop   11/18/16 1830  erythromycin (EES) 400 MG/5ML suspension 200 mg  Status:  Discontinued     200 mg Oral Every 8 hours 11/18/16 1728 11/24/16 1251   11/13/16 2100  ceFAZolin (ANCEF) IVPB 2g/100 mL premix     2 g 200 mL/hr over 30 Minutes Intravenous Every 8 hours 11/13/16 1953 11/13/16 2057   11/13/16 0622  ceFAZolin (ANCEF) 2-4 GM/100ML-% IVPB    Comments:  Tamsen Snider   : cabinet override      11/13/16 0622 11/13/16 0801   11/13/16 0618  ceFAZolin (ANCEF) IVPB 2g/100 mL premix     2 g 200 mL/hr over 30 Minutes Intravenous On call to O.R. 11/13/16 7121 11/13/16 0806       Assessment/Plan: s/p Procedure(s): ATTEMPTED DAGNOSTIC LAPAROSCOPY (N/A) WHIPPLE PROCEDURE (N/A) PLACEMENT JEJUNOSTOMY TUBE (N/A) Soft diet as tolerated. Tube feeds.  Will hold at 1/2 the total amount.  If she doesn't start tolerating better, she will need TPN.  Was only taking in around 25 % calories po prior to tube feeds starting.  Now feels like she can't eat anything, and she is only at half goal rate.      LOS: 15 days    Masai Kidd 11/28/2016

## 2016-11-28 NOTE — Care Management Important Message (Signed)
Important Message  Patient Details  Name: Rhonda Spencer MRN: [id number] Date of Birth: 1941-02-05   Medicare Important Message Given:  Yes    Nathen May 11/28/2016, 11:22 AM

## 2016-11-28 NOTE — Progress Notes (Signed)
Obtained report.  Assuming care of patient

## 2016-11-29 LAB — COMPREHENSIVE METABOLIC PANEL
ALBUMIN: 2.3 g/dL — AB (ref 3.5–5.0)
ALK PHOS: 100 U/L (ref 38–126)
ALT: 14 U/L (ref 14–54)
AST: 18 U/L (ref 15–41)
Anion gap: 6 (ref 5–15)
BUN: 8 mg/dL (ref 6–20)
CO2: 27 mmol/L (ref 22–32)
Calcium: 7.9 mg/dL — ABNORMAL LOW (ref 8.9–10.3)
Chloride: 101 mmol/L (ref 101–111)
Creatinine, Ser: 0.7 mg/dL (ref 0.44–1.00)
GFR calc Af Amer: >60 mL/min (ref >60)
GFR calc non Af Amer: >60 mL/min (ref >60)
GLUCOSE: 140 mg/dL — AB (ref 65–99)
POTASSIUM: 3.4 mmol/L — AB (ref 3.5–5.1)
SODIUM: 134 mmol/L — AB (ref 135–145)
TOTAL PROTEIN: 5.3 g/dL — AB (ref 6.5–8.1)
Total Bilirubin: 1 mg/dL (ref 0.3–1.2)

## 2016-11-29 LAB — CBC
HCT: 28.9 % — ABNORMAL LOW (ref 36.0–46.0)
HEMOGLOBIN: 9.6 g/dL — AB (ref 12.0–15.0)
MCH: 29 pg (ref 26.0–34.0)
MCHC: 33.2 g/dL (ref 30.0–36.0)
MCV: 87.3 fL (ref 78.0–100.0)
Platelets: 400 10*3/uL (ref 150–400)
RBC: 3.31 MIL/uL — AB (ref 3.87–5.11)
RDW: 13.2 % (ref 11.5–15.5)
WBC: 3.5 10*3/uL — AB (ref 4.0–10.5)

## 2016-11-29 LAB — GLUCOSE, CAPILLARY
GLUCOSE-CAPILLARY: 154 mg/dL — AB (ref 65–99)
GLUCOSE-CAPILLARY: 103 mg/dL — AB (ref 65–99)
GLUCOSE-CAPILLARY: 162 mg/dL — AB (ref 65–99)
GLUCOSE-CAPILLARY: 172 mg/dL — AB (ref 65–99)
Glucose-Capillary: 143 mg/dL — ABNORMAL HIGH (ref 65–99)
Glucose-Capillary: 159 mg/dL — ABNORMAL HIGH (ref 65–99)

## 2016-11-29 MED ORDER — VITAL 1.5 CAL PO LIQD
1000.0000 mL | ORAL | Status: DC
  Administered 2016-11-30: 1000 mL
  Filled 2016-11-29 (×2): qty 1000

## 2016-11-29 MED ORDER — FAT EMULSION 20 % IV EMUL
120.0000 mL | INTRAVENOUS | Status: AC
  Administered 2016-11-29: 120 mL via INTRAVENOUS
  Filled 2016-11-29: qty 200

## 2016-11-29 MED ORDER — SODIUM CHLORIDE 0.9% FLUSH
10.0000 mL | INTRAVENOUS | Status: DC | PRN
  Administered 2016-11-29 – 2016-12-04 (×3): 10 mL
  Filled 2016-11-29 (×3): qty 40

## 2016-11-29 MED ORDER — TRACE MINERALS CR-CU-MN-SE-ZN 10-1000-500-60 MCG/ML IV SOLN
INTRAVENOUS | Status: AC
  Administered 2016-11-29: 18:00:00 via INTRAVENOUS
  Filled 2016-11-29: qty 960

## 2016-11-29 NOTE — Progress Notes (Signed)
[place] NOTE   Pharmacy Consult for TPN Indication: Intolerance to enteral feeding  Patient Measurements: Height: 5\' 5"  (165.1 cm) Weight: 148 lb 13 oz (67.5 kg) IBW/kg (Calculated) : 57 TPN AdjBW (KG): 65.5 Body mass index is 24.76 kg/m.   Assessment:  76yo female with Pancreatic cancer with pre-op evaluation of moderate Protein Calorie Malnutrition on 10/28/16.  Pt was admitted on 11/13/16 is now pod#16 s/p Whipple procedure and placement of J-tube.  Pt has been on Vital-AF but has not been able to tolerate advancement beyond 30 ml/hr (goal 60 ml/hr) and is not meeting nutritional goals per calorie count.  She is currently on a soft diet, but only able to tolerate 2-3 bites of food.  She is to start TPN with hopes of being able to advance po intake over the week.  GI:  S/P whipple procedure 11/13/16.  Vital AF 24ml/hr, refuses to advance rate d/t bloating.  Poor po intake documented by calorie count.  Ensure TID- refusing.  (+)BM.  Creon TIDwc, PPI-po, soft diet. Endo:  Sensitive SSI. Insulin requirements in the past 24 hours: 0 Lytes: Na 134- stable, K 3.4 Renal:  Cr 0.7, UOP x 6 Pulm:  RA Cards: Ao insufficiency, HTN, hx Rheumatic heart dz.  BP high, hr wnl Hepatobil:  LFTs wnl, no Prealbumin nor TG this admit Neuro: ID:  Best Practices: TPN Access:  PICC to be placed 8/25 TPN start date:  8/25 >>  Nutritional Goals (per RD recommendation on 11/27/16): KCal:  1700-1800 kcal Protein: 105-115g  Current Nutrition:  Vital A-F at 30 ml/hr, providing 864 kcal and 54g protein daily  Plan:  Clinimix-E 5/15 at 40 ml/hr 20% Lipid emulsion at 28ml/hr over 12hr CMet, Mg, Phos, TG, and Prealbumin in AM TPN labs q Mon/Thurs Continue SSI q4 TPN + TF at above rates will provide 1785 kcal and 102 g protein, meeting 100% of nutritional goals  Lewie Chamber., PharmD Clinical Pharmacist [facility]

## 2016-11-29 NOTE — Progress Notes (Signed)
16 Days Post-Op  Subjective: She says she feels better and is swallowing better Had a bowel movement Only took 2 or 3 bites of her food, however She does not want to increase the tube feedings because they cause bloating We agreed that we will start TNA today and hopefully can wean that off this week if her p.o.diet improves.  Hemoglobin 9.6.  WBC 3500.  Potassium 3.4.  Creatinine 0.7.  Glucose 140.  LFTs normal.  Objective: Vital signs in last 24 hours: Temp:  [98.5 F (36.9 C)] 98.5 F (36.9 C) (08/25 0520) Pulse Rate:  [74-82] 74 (08/25 0520) Resp:  [18-19] 18 (08/25 0520) BP: (154-166)/(66-81) 154/66 (08/25 0520) SpO2:  [99 %-100 %] 99 % (08/25 0520) Last BM Date: 11/28/16  Intake/Output from previous day: 08/24 0701 - 08/25 0700 In: 1210.7 [P.O.:420; NG/GT:790.7] Out: -  Intake/Output this shift: No intake/output data recorded. General appearance: alert.  Cooperative.  Very pleasant.  No distress.  Mental status good. Resp: clear to auscultation bilaterally GI: abnormal findings:  soft.  Not distended.  Minimally tender.  Wound clean.   Lab Results:  Results for orders placed or performed during the hospital encounter of 11/13/16 (from the past 24 hour(s))  Glucose, capillary     Status: Abnormal   Collection Time: 11/28/16 12:34 PM  Result Value Ref Range   Glucose-Capillary 123 (H) 65 - 99 mg/dL  Glucose, capillary     Status: Abnormal   Collection Time: 11/28/16  4:30 PM  Result Value Ref Range   Glucose-Capillary 105 (H) 65 - 99 mg/dL   Comment 1 Notify RN   Glucose, capillary     Status: Abnormal   Collection Time: 11/28/16  9:01 PM  Result Value Ref Range   Glucose-Capillary 138 (H) 65 - 99 mg/dL  Glucose, capillary     Status: Abnormal   Collection Time: 11/29/16 12:46 AM  Result Value Ref Range   Glucose-Capillary 143 (H) 65 - 99 mg/dL  CBC     Status: Abnormal   Collection Time: 11/29/16  3:46 AM  Result Value Ref Range   WBC 3.5 (L) 4.0 - 10.5 K/uL    RBC 3.31 (L) 3.87 - 5.11 MIL/uL   Hemoglobin 9.6 (L) 12.0 - 15.0 g/dL   HCT 28.9 (L) 36.0 - 46.0 %   MCV 87.3 78.0 - 100.0 fL   MCH 29.0 26.0 - 34.0 pg   MCHC 33.2 30.0 - 36.0 g/dL   RDW 13.2 11.5 - 15.5 %   Platelets 400 150 - 400 K/uL  Comprehensive metabolic panel     Status: Abnormal   Collection Time: 11/29/16  3:46 AM  Result Value Ref Range   Sodium 134 (L) 135 - 145 mmol/L   Potassium 3.4 (L) 3.5 - 5.1 mmol/L   Chloride 101 101 - 111 mmol/L   CO2 27 22 - 32 mmol/L   Glucose, Bld 140 (H) 65 - 99 mg/dL   BUN 8 6 - 20 mg/dL   Creatinine, Ser 0.70 0.44 - 1.00 mg/dL   Calcium 7.9 (L) 8.9 - 10.3 mg/dL   Total Protein 5.3 (L) 6.5 - 8.1 g/dL   Albumin 2.3 (L) 3.5 - 5.0 g/dL   AST 18 15 - 41 U/L   ALT 14 14 - 54 U/L   Alkaline Phosphatase 100 38 - 126 U/L   Total Bilirubin 1.0 0.3 - 1.2 mg/dL   GFR calc non Af Amer >60 >60 mL/min   GFR calc Af Amer >60 >  60 mL/min   Anion gap 6 5 - 15  Glucose, capillary     Status: Abnormal   Collection Time: 11/29/16  5:19 AM  Result Value Ref Range   Glucose-Capillary 159 (H) 65 - 99 mg/dL  Glucose, capillary     Status: Abnormal   Collection Time: 11/29/16  8:37 AM  Result Value Ref Range   Glucose-Capillary 154 (H) 65 - 99 mg/dL   Comment 1 Notify RN      Studies/Results: No results found.  [name] acetaminophen  650 mg Oral QID  . feeding supplement (ENSURE ENLIVE)  237 mL Oral TID BM  . feeding supplement (VITAL AF 1.2 CAL)  1,000 mL Per Tube Q24H  . insulin aspart  0-9 Units Subcutaneous Q4H  . lipase/protease/amylase  36,000 Units Oral TID AC  . mouth rinse  15 mL Mouth Rinse BID  . pantoprazole  40 mg Oral BID     Assessment/Plan: s/p Procedure(s): ATTEMPTED DAGNOSTIC LAPAROSCOPY WHIPPLE PROCEDURE PLACEMENT JEJUNOSTOMY TUBE  POD #16.  Whipple procedure. Intolerance to tube feedings. Declines increasing tube feed rate due to bloating. Inadequate oral intake Protein calorie malnutrition Otherwise stable without  additional intra-abdominal complication  Insert PICC line and start TNA today.  Hopefully appetite will pick up and can wean TNA   @PROBHOSP @  LOS: 16 days    Tarea Skillman M 11/29/2016  . .prob

## 2016-11-29 NOTE — Progress Notes (Addendum)
Nutrition Follow-up  DOCUMENTATION CODES:  Non-severe (moderate) malnutrition in context of chronic illness  INTERVENTION:  TPN per pharmacy-current rate is sufficient to meet 100% needs.   Tolerating TF at rate of 30 cc/hr. To maximize calories/ml, switch Vital 1.2 to Vital 1.5. Continue to infuse at 30 cc/hr. Will provide 1080 kcals, 49 g Pro, 550 ml free water. Would recommend giving creon more regularly, such as q 4hrs, since pt is on continuous TF-formula only semielemental, not true elemental.  Took Meal requests to promote intake. Passed to dietary  NUTRITION DIAGNOSIS:  Malnutrition (moderate) related to chronic illness (pancreatic cancer) as evidenced by mild depletion of muscle mass, mild depletion of body fat, percent weight loss, energy intake < 75% for > or equal to 1 month.  GOAL:  Patient will meet greater than or equal to 90% of their needs   Not met  MONITOR:  PO intake, Supplement acceptance, Labs, Weight trends, Skin, I & O's  REASON FOR ASSESSMENT:  New TPN    ASSESSMENT:  76 yo female with PMH of rheumatoid arthritis, HTN, rheumatic heart disease, mitral regurgitation, aortic insufficiency who was admitted on 8/9 with pancreatic cancer. S/P Whipple procedure with placement of jejunostomy on 8/9.  Patient seen up in bed. Meal tray at bedside is untouched. TF infusing at 30 cc/hr. She hadnt realized the TF pump was even on. Does not appear to have any issue with the rate of 30.   She says she immediately feels full with only bites of food; "its like it hits a wall". She then becomes nauseated and reports episodes of emesis.   She had a list of items that she would like to try to eat. These were noted and will be passed to dietary.   She had PICC placed today for TPN support. Current formula providing 48 g Pro and 921 kcals. This combined with TF meets 100% of needs.   Will trial changing TF to Vital af 1.5 to maximize enteral support. THis would provide  patient with near 70% of kcal needs and 60% of protein needs. With PO intake of 25% (~500 kcals) of meals, would likely be able to meet needs w/o TPN.  Continue to monitor PO intake.   Denies any Diarrhea or constipation.   Labs: K: 3.4, Na: 134, BG 100-150, Albumin: 2.3,  Meds: Insulin, Creon, PPI, tpn,    Recent Labs Lab 11/27/16 0553 11/28/16 0559 11/29/16 0346  NA 132* 134* 134*  K 3.9 3.7 3.4*  CL 99* 99* 101  CO2 23 27 27   BUN 6 10 8   CREATININE 0.79 0.71 0.70  CALCIUM 8.0* 8.1* 7.9*  GLUCOSE 85 146* 140*   Diet Order:  DIET SOFT Room service appropriate? Yes; Fluid consistency: Thin TPN (CLINIMIX-E) Adult  Skin:  closed abdominal incision  Last BM:  8/25  Height:  Ht Readings from Last 1 Encounters:  11/16/16 5' 5"  (1.651 m)   Weight:  Wt Readings from Last 1 Encounters:  11/29/16 139 lb 9.6 oz (63.3 kg)   Wt Readings from Last 10 Encounters:  11/29/16 139 lb 9.6 oz (63.3 kg)  11/06/16 132 lb 3.2 oz (60 kg)  10/30/16 132 lb 11.2 oz (60.2 kg)  10/14/16 134 lb 8 oz (61 kg)  10/03/16 143 lb (64.9 kg)  09/26/16 143 lb (64.9 kg)  06/06/16 153 lb 9.6 oz (69.7 kg)  10/05/15 158 lb (71.7 kg)  09/22/15 151 lb 14.4 oz (68.9 kg)  04/19/15 154 lb 14.4 oz (70.3  kg)   Ideal Body Weight:  56.8 kg  BMI:  Body mass index is 23.23 kg/m.  Estimated Nutritional Needs:  Kcal:  1600-1800 Protein:  80-90 gm Fluid:  1.6-1.8 L  EDUCATION NEEDS:  No education needs identified at this time  Burtis Junes RD, LDN, [place] Clinical Nutrition Pager: [phone] 11/29/2016 5:45 PM

## 2016-11-29 NOTE — Progress Notes (Signed)
Peripherally Inserted Central Catheter/Midline Placement  The IV Nurse has discussed with the patient and/or persons authorized to consent for the patient, the purpose of this procedure and the potential benefits and risks involved with this procedure.  The benefits include less needle sticks, lab draws from the catheter, and the patient may be discharged home with the catheter. Risks include, but not limited to, infection, bleeding, blood clot (thrombus formation), and puncture of an artery; nerve damage and irregular heartbeat and possibility to perform a PICC exchange if needed/ordered by physician.  Alternatives to this procedure were also discussed.  Bard Power PICC patient education guide, fact sheet on infection prevention and patient information card has been provided to patient /or left at bedside.    PICC/Midline Placement Documentation  PICC Double Lumen 11/29/16 PICC Right Brachial 37 cm 0 cm (Active)  Indication for Insertion or Continuance of Line Administration of hyperosmolar/irritating solutions (i.e. TPN, Vancomycin, etc.) 11/29/2016 12:56 PM  Exposed Catheter (cm) 0 cm 11/29/2016 12:56 PM  Site Assessment Clean;Dry;Intact 11/29/2016 12:56 PM  Lumen #1 Status Flushed;Saline locked;Blood return noted 11/29/2016 12:56 PM  Lumen #2 Status Flushed;Saline locked;Blood return noted 11/29/2016 12:56 PM  Dressing Type Transparent 11/29/2016 12:56 PM  Dressing Status Clean;Dry;Intact;Antimicrobial disc in place 11/29/2016 12:56 PM  Line Care Connections checked and tightened 11/29/2016 12:56 PM  Line Adjustment (NICU/IV Team Only) No 11/29/2016 12:56 PM  Dressing Intervention New dressing 11/29/2016 12:56 PM  Dressing Change Due 12/06/16 11/29/2016 12:56 PM       Rhonda Spencer 11/29/2016, 12:56 PM

## 2016-11-30 ENCOUNTER — Inpatient Hospital Stay (HOSPITAL_COMMUNITY): Payer: Medicare Other

## 2016-11-30 LAB — DIFFERENTIAL
BASOS ABS: 0 10*3/uL (ref 0.0–0.1)
BASOS PCT: 0 %
Eosinophils Absolute: 0.3 10*3/uL (ref 0.0–0.7)
Eosinophils Relative: 6 %
Lymphocytes Relative: 28 %
Lymphs Abs: 1.3 10*3/uL (ref 0.7–4.0)
Monocytes Absolute: 0.8 10*3/uL (ref 0.1–1.0)
Monocytes Relative: 17 %
NEUTROS ABS: 2.2 10*3/uL (ref 1.7–7.7)
Neutrophils Relative %: 49 %

## 2016-11-30 LAB — COMPREHENSIVE METABOLIC PANEL
ALBUMIN: 2.4 g/dL — AB (ref 3.5–5.0)
ALK PHOS: 93 U/L (ref 38–126)
ALT: 14 U/L (ref 14–54)
AST: 20 U/L (ref 15–41)
Anion gap: 5 (ref 5–15)
BUN: 10 mg/dL (ref 6–20)
CALCIUM: 7.9 mg/dL — AB (ref 8.9–10.3)
CO2: 29 mmol/L (ref 22–32)
CREATININE: 0.67 mg/dL (ref 0.44–1.00)
Chloride: 100 mmol/L — ABNORMAL LOW (ref 101–111)
GFR calc Af Amer: >60 mL/min (ref >60)
GFR calc non Af Amer: >60 mL/min (ref >60)
GLUCOSE: 211 mg/dL — AB (ref 65–99)
Potassium: 3.4 mmol/L — ABNORMAL LOW (ref 3.5–5.1)
SODIUM: 134 mmol/L — AB (ref 135–145)
Total Bilirubin: 0.9 mg/dL (ref 0.3–1.2)
Total Protein: 5.6 g/dL — ABNORMAL LOW (ref 6.5–8.1)

## 2016-11-30 LAB — GLUCOSE, CAPILLARY
GLUCOSE-CAPILLARY: 178 mg/dL — AB (ref 65–99)
GLUCOSE-CAPILLARY: 203 mg/dL — AB (ref 65–99)
Glucose-Capillary: 293 mg/dL — ABNORMAL HIGH (ref 65–99)
Glucose-Capillary: 118 mg/dL — ABNORMAL HIGH (ref 65–99)
Glucose-Capillary: 194 mg/dL — ABNORMAL HIGH (ref 65–99)
Glucose-Capillary: 200 mg/dL — ABNORMAL HIGH (ref 65–99)

## 2016-11-30 LAB — PREALBUMIN: Prealbumin: 8.2 mg/dL — ABNORMAL LOW (ref 18–38)

## 2016-11-30 LAB — MAGNESIUM: Magnesium: 1.6 mg/dL — ABNORMAL LOW (ref 1.7–2.4)

## 2016-11-30 LAB — CBC
HCT: 28.8 % — ABNORMAL LOW (ref 36.0–46.0)
Hemoglobin: 9.4 g/dL — ABNORMAL LOW (ref 12.0–15.0)
MCH: 28.7 pg (ref 26.0–34.0)
MCHC: 32.6 g/dL (ref 30.0–36.0)
MCV: 88.1 fL (ref 78.0–100.0)
Platelets: 375 10*3/uL (ref 150–400)
RBC: 3.27 MIL/uL — ABNORMAL LOW (ref 3.87–5.11)
RDW: 13.2 % (ref 11.5–15.5)
WBC: 4.6 10*3/uL (ref 4.0–10.5)

## 2016-11-30 LAB — PHOSPHORUS: PHOSPHORUS: 2.3 mg/dL — AB (ref 2.5–4.6)

## 2016-11-30 LAB — TRIGLYCERIDES: Triglycerides: 85 mg/dL (ref <150)

## 2016-11-30 MED ORDER — POTASSIUM CHLORIDE 10 MEQ/50ML IV SOLN
10.0000 meq | INTRAVENOUS | Status: AC
  Administered 2016-11-30 (×4): 10 meq via INTRAVENOUS
  Filled 2016-11-30 (×4): qty 50

## 2016-11-30 MED ORDER — VITAL AF 1.2 CAL PO LIQD
1000.0000 mL | ORAL | Status: DC
  Filled 2016-11-30 (×4): qty 1000

## 2016-11-30 MED ORDER — TRACE MINERALS CR-CU-MN-SE-ZN 10-1000-500-60 MCG/ML IV SOLN
INTRAVENOUS | Status: AC
  Administered 2016-11-30: 18:00:00 via INTRAVENOUS
  Filled 2016-11-30: qty 1800

## 2016-11-30 MED ORDER — TRACE MINERALS CR-CU-MN-SE-ZN 10-1000-500-60 MCG/ML IV SOLN
INTRAVENOUS | Status: DC
  Filled 2016-11-30: qty 960

## 2016-11-30 MED ORDER — FAT EMULSION 20 % IV EMUL
120.0000 mL | INTRAVENOUS | Status: DC
  Filled 2016-11-30: qty 250

## 2016-11-30 MED ORDER — DIPHENHYDRAMINE HCL 50 MG/ML IJ SOLN: 12.5000 mg | Freq: Three times a day (TID) | INTRAMUSCULAR | Status: DC | PRN

## 2016-11-30 MED ORDER — INSULIN ASPART 100 UNIT/ML ~~LOC~~ SOLN
0.0000 [IU] | SUBCUTANEOUS | Status: DC
  Administered 2016-11-30 (×2): 3 [IU] via SUBCUTANEOUS
  Administered 2016-12-01 (×2): 1 [IU] via SUBCUTANEOUS
  Administered 2016-12-01: 2 [IU] via SUBCUTANEOUS
  Administered 2016-12-01: 3 [IU] via SUBCUTANEOUS
  Administered 2016-12-01 (×2): 2 [IU] via SUBCUTANEOUS
  Administered 2016-12-02: 3 [IU] via SUBCUTANEOUS
  Administered 2016-12-02: 2 [IU] via SUBCUTANEOUS
  Administered 2016-12-02: 3 [IU] via SUBCUTANEOUS
  Administered 2016-12-02: 2 [IU] via SUBCUTANEOUS
  Administered 2016-12-03 (×2): 3 [IU] via SUBCUTANEOUS
  Administered 2016-12-03: 5 [IU] via SUBCUTANEOUS
  Administered 2016-12-03: 3 [IU] via SUBCUTANEOUS
  Administered 2016-12-04: 5 [IU] via SUBCUTANEOUS
  Administered 2016-12-04: 2 [IU] via SUBCUTANEOUS
  Administered 2016-12-04: 5 [IU] via SUBCUTANEOUS

## 2016-11-30 MED ORDER — PANCRELIPASE (LIP-PROT-AMYL) 12000-38000 UNITS PO CPEP
36000.0000 [IU] | ORAL_CAPSULE | ORAL | Status: DC
  Administered 2016-11-30 – 2016-12-04 (×26): 36000 [IU] via ORAL
  Filled 2016-11-30 (×3): qty 3
  Filled 2016-11-30: qty 1
  Filled 2016-11-30 (×17): qty 3
  Filled 2016-11-30: qty 1
  Filled 2016-11-30 (×3): qty 3

## 2016-11-30 MED ORDER — SODIUM PHOSPHATES 45 MMOLE/15ML IV SOLN
10.0000 mmol | Freq: Once | INTRAVENOUS | Status: AC
  Administered 2016-11-30: 10 mmol via INTRAVENOUS
  Filled 2016-11-30: qty 3.33

## 2016-11-30 MED ORDER — MAGNESIUM SULFATE IN D5W 1-5 GM/100ML-% IV SOLN
1.0000 g | Freq: Once | INTRAVENOUS | Status: AC
  Administered 2016-11-30: 1 g via INTRAVENOUS
  Filled 2016-11-30: qty 100

## 2016-11-30 MED ORDER — ACETAMINOPHEN 160 MG/5ML PO SOLN
650.0000 mg | Freq: Four times a day (QID) | ORAL | Status: DC
  Administered 2016-12-01 – 2016-12-03 (×2): 650 mg via ORAL
  Filled 2016-11-30 (×8): qty 20.3

## 2016-11-30 MED ORDER — FAT EMULSION 20 % IV EMUL
240.0000 mL | INTRAVENOUS | Status: AC
  Administered 2016-11-30: 240 mL via INTRAVENOUS
  Filled 2016-11-30: qty 250

## 2016-11-30 NOTE — Progress Notes (Addendum)
[place] NOTE   Pharmacy Consult for TPN Indication: Intolerance to enteral feeding  Patient Measurements: Height: 5\' 5"  (165.1 cm) Weight: 139 lb 9.6 oz (63.3 kg) IBW/kg (Calculated) : 57 TPN AdjBW (KG): 65.5 Body mass index is 23.23 kg/m.   Assessment:  76yo female with Pancreatic cancer with pre-op evaluation of moderate Protein Calorie Malnutrition on 10/28/16.  Pt was admitted on 11/13/16 is now pod#16 s/p Whipple procedure and placement of J-tube.  Pt has been on Vital-AF but has not been able to tolerate advancement beyond 30 ml/hr (goal 60 ml/hr) and is not meeting nutritional goals per calorie count.  She is currently on a soft diet, but only able to tolerate 2-3 bites of food.  She is to start TPN with hopes of being able to advance po intake over the week.  GI:  S/P whipple procedure 11/13/16.  Changed to Vital 1.5 at 58ml/hr, refuses to advance rate d/t bloating.  Poor po intake documented by calorie count.  Ensure TID- refusing.  (-)N/V.  Creon TIDwc, PPI-po, soft diet. Endo:  Sensitive SSI.  CBGs 172-293 since TPN added Insulin requirements in the past 24 hours: 12 Lytes: Na 134- stable, K 3.4- K runs x 4, Phos 2.3, Mg 1.6 Renal:  Cr 0.7, UOP x 6 Pulm:  RA Cards: Ao insufficiency, HTN, hx Rheumatic heart dz.  BP high, hr wnl Hepatobil:  LFTs wnl, no Prealbumin nor TG this admit Neuro: low dose Benadryl for anxiety ID:  Best Practices: TPN Access:  PICC to be placed 8/25 TPN start date:  8/25 >>  Nutritional Goals (per RD recommendation on 11/27/16): KCal:  1700-1800 kcal Protein: 105-115g  Current Nutrition:  Changed to Vital 1.5 at 30 ml/hr, providing 1080 kcal and 49g protein daily Clinimix-E 5/15 at 40 ml/hr + 20% lipid emulsion 120 ml/day providing 922 kcal and 48g protein daily  Note: Vital 1.5 provides more calories but less protein per liter than Vital AF 1.2, discussed this with Dr Brantley Stage (on call) and will change  back to Vital AF 1.2  Plan:  Continue Clinimix-E 5/15 at 40 ml/hr Add MVI and Trace elements to TPN Add insulin 10 units to TPN Continue 20% Lipid emulsion at 25ml/hr over 12hr Change TF to Vital AF 1.2 at 30 ml/hr Na Phos 39mmol IV x 1 Mg 1g IV x 1 TPN labs q Mon/Thurs Change to moderate SSI q4 TPN + TF at above rates will provide 1785 kcal and 102 g protein, meeting 100% of nutritional goals   Lewie Chamber., PharmD Clinical Pharmacist [facility]   11/30/16 [phone])  Addendum  Notified that TF has been stopped as pt experiencing nausea and that Dr Dalbert Batman called Pharmacy to alert of this event.  Will advance TPN to meet nutritional goals while off TF.  Increase Clinimix-E 5/15 to 5ml/hr Change 20% Lipid emulsion to run at 49ml/hr over 12hr TPN will provide 1758 kcal and 90g protein, meeting 100% of caloric and 86% of protein goals   Gracy Bruins, B.S., PharmD [facility]

## 2016-11-30 NOTE — Progress Notes (Signed)
Surgery:  Nursing staff called that tube feedings are causing the patient perceived nausea.  No vomiting.   Nursing staff stated that son was very angry that the patient is back on tube feedings and demanded that they be stopped. I decrease tube feedings to 10 mL per hour Check abdominal x-ray   Rhonda Spencer

## 2016-11-30 NOTE — Progress Notes (Signed)
Pt voiced to nurse during bedside reporting that she thinks that the tube feeding is causing her to be nauseated,says that she didn't feel nauseous until the feeding was started. Son then visited later on and came to the desk very agitated by the fact that his mom is back on feeding and he demanded that the feeding be stopped. Feeding stopped to appease him ,MD text paged regarding the matter and order to discontinue feeding given

## 2016-11-30 NOTE — Progress Notes (Signed)
17 Days Post-Op  Subjective: Stable and alert.  No nausea or vomiting.no pain. She feels a little weaker today.  PT involved Started on TNA yesterday 2 feedings at 30 mL per hour, changed formula to vital 1.5 to increase calories Tube feeds plus TNA should meet all nutritional needs Continue soft diet as tolerated  She asked for something for anxiety and so we are going to let her have low-dose Benadryl.  Objective: Vital signs in last 24 hours: Temp:  [98.9 F (37.2 C)-99.7 F (37.6 C)] 99.7 F (37.6 C) (08/26 0436) Pulse Rate:  [77-85] 77 (08/26 0436) Resp:  [17-18] 17 (08/26 0436) BP: (151-169)/(63-72) 154/69 (08/26 0436) SpO2:  [98 %-100 %] 98 % (08/26 0436) Weight:  [63.3 kg (139 lb 9.6 oz)] 63.3 kg (139 lb 9.6 oz) (08/25 1437) Last BM Date: 11/28/16  Intake/Output from previous day: 08/25 0701 - 08/26 0700 In: 1260 [I.V.:420; NG/GT:720] Out: -  Intake/Output this shift: Total I/O In: 760 [I.V.:400; NG/GT:360] Out: -   General appearance: alert.  Very pleasant.  No distress.  Deconditioned. Resp: clear to auscultation bilaterally GI: soft.  Nondistended.  Nontender.  Wound clean.  Lab Results:  Results for orders placed or performed during the hospital encounter of 11/13/16 (from the past 24 hour(s))  Glucose, capillary     Status: Abnormal   Collection Time: 11/29/16  8:37 AM  Result Value Ref Range   Glucose-Capillary 154 (H) 65 - 99 mg/dL   Comment 1 Notify RN   Glucose, capillary     Status: Abnormal   Collection Time: 11/29/16  1:45 PM  Result Value Ref Range   Glucose-Capillary 103 (H) 65 - 99 mg/dL  Glucose, capillary     Status: Abnormal   Collection Time: 11/29/16  6:17 PM  Result Value Ref Range   Glucose-Capillary 162 (H) 65 - 99 mg/dL  Glucose, capillary     Status: Abnormal   Collection Time: 11/29/16  7:57 PM  Result Value Ref Range   Glucose-Capillary 172 (H) 65 - 99 mg/dL  Glucose, capillary     Status: Abnormal   Collection Time: 11/30/16  12:05 AM  Result Value Ref Range   Glucose-Capillary 178 (H) 65 - 99 mg/dL   Comment 1 Notify RN    Comment 2 Document in Chart   CBC     Status: Abnormal   Collection Time: 11/30/16  4:03 AM  Result Value Ref Range   WBC 4.6 4.0 - 10.5 K/uL   RBC 3.27 (L) 3.87 - 5.11 MIL/uL   Hemoglobin 9.4 (L) 12.0 - 15.0 g/dL   HCT 28.8 (L) 36.0 - 46.0 %   MCV 88.1 78.0 - 100.0 fL   MCH 28.7 26.0 - 34.0 pg   MCHC 32.6 30.0 - 36.0 g/dL   RDW 13.2 11.5 - 15.5 %   Platelets 375 150 - 400 K/uL  Comprehensive metabolic panel     Status: Abnormal   Collection Time: 11/30/16  4:03 AM  Result Value Ref Range   Sodium 134 (L) 135 - 145 mmol/L   Potassium 3.4 (L) 3.5 - 5.1 mmol/L   Chloride 100 (L) 101 - 111 mmol/L   CO2 29 22 - 32 mmol/L   Glucose, Bld 211 (H) 65 - 99 mg/dL   BUN 10 6 - 20 mg/dL   Creatinine, Ser 0.67 0.44 - 1.00 mg/dL   Calcium 7.9 (L) 8.9 - 10.3 mg/dL   Total Protein 5.6 (L) 6.5 - 8.1 g/dL   Albumin  2.4 (L) 3.5 - 5.0 g/dL   AST 20 15 - 41 U/L   ALT 14 14 - 54 U/L   Alkaline Phosphatase 93 38 - 126 U/L   Total Bilirubin 0.9 0.3 - 1.2 mg/dL   GFR calc non Af Amer >60 >60 mL/min   GFR calc Af Amer >60 >60 mL/min   Anion gap 5 5 - 15  Prealbumin     Status: Abnormal   Collection Time: 11/30/16  4:03 AM  Result Value Ref Range   Prealbumin 8.2 (L) 18 - 38 mg/dL  Magnesium     Status: Abnormal   Collection Time: 11/30/16  4:03 AM  Result Value Ref Range   Magnesium 1.6 (L) 1.7 - 2.4 mg/dL  Phosphorus     Status: Abnormal   Collection Time: 11/30/16  4:03 AM  Result Value Ref Range   Phosphorus 2.3 (L) 2.5 - 4.6 mg/dL  Triglycerides     Status: None   Collection Time: 11/30/16  4:03 AM  Result Value Ref Range   Triglycerides 85 <150 mg/dL  Differential     Status: None   Collection Time: 11/30/16  4:03 AM  Result Value Ref Range   Neutrophils Relative % 49 %   Neutro Abs 2.2 1.7 - 7.7 K/uL   Lymphocytes Relative 28 %   Lymphs Abs 1.3 0.7 - 4.0 K/uL   Monocytes  Relative 17 %   Monocytes Absolute 0.8 0.1 - 1.0 K/uL   Eosinophils Relative 6 %   Eosinophils Absolute 0.3 0.0 - 0.7 K/uL   Basophils Relative 0 %   Basophils Absolute 0.0 0.0 - 0.1 K/uL  Glucose, capillary     Status: Abnormal   Collection Time: 11/30/16  4:17 AM  Result Value Ref Range   Glucose-Capillary 203 (H) 65 - 99 mg/dL   Comment 1 Notify RN    Comment 2 Document in Chart      Studies/Results: No results found.  [name] acetaminophen  650 mg Oral QID  . feeding supplement (ENSURE ENLIVE)  237 mL Oral TID BM  . insulin aspart  0-9 Units Subcutaneous Q4H  . lipase/protease/amylase  36,000 Units Oral TID AC  . mouth rinse  15 mL Mouth Rinse BID  . pantoprazole  40 mg Oral BID     Assessment/Plan: s/p Procedure(s): ATTEMPTED DAGNOSTIC LAPAROSCOPY WHIPPLE PROCEDURE PLACEMENT JEJUNOSTOMY TUBE  POD #17.  Whipple procedure. Intolerance to tube feedings. Declines increasing tube feed rate due to bloating. Inadequate oral intake Protein calorie malnutrition Otherwise stable without additional intra-abdominal complication  TNA started.  Continue tube feeds at 30 mL per hour.  Soft diet as tolerated Continue physical therapy Anticipate slow recovering   @PROBHOSP @  LOS: 17 days    Macayla Ekdahl M 11/30/2016  . .prob

## 2016-11-30 NOTE — Progress Notes (Signed)
Pt reports relief since tube feedings were stopped. Managed to eat some moderate amounts of dessert on her lunch tray, no c/o nausea reported

## 2016-11-30 NOTE — Progress Notes (Signed)
Tube feedings not to be restarted as ordered by MD as pt and her son do not want it restarted

## 2016-12-01 LAB — DIFFERENTIAL
BASOS PCT: 1 %
Basophils Absolute: 0 10*3/uL (ref 0.0–0.1)
EOS PCT: 6 %
Eosinophils Absolute: 0.5 10*3/uL (ref 0.0–0.7)
Lymphocytes Relative: 21 %
Lymphs Abs: 1.5 10*3/uL (ref 0.7–4.0)
MONO ABS: 1.2 10*3/uL — AB (ref 0.1–1.0)
MONOS PCT: 16 %
NEUTROS ABS: 4.1 10*3/uL (ref 1.7–7.7)
Neutrophils Relative %: 56 %

## 2016-12-01 LAB — COMPREHENSIVE METABOLIC PANEL
ALK PHOS: 84 U/L (ref 38–126)
ALT: 12 U/L — AB (ref 14–54)
AST: 21 U/L (ref 15–41)
Albumin: 2.4 g/dL — ABNORMAL LOW (ref 3.5–5.0)
Anion gap: 6 (ref 5–15)
BILIRUBIN TOTAL: 0.8 mg/dL (ref 0.3–1.2)
BUN: 12 mg/dL (ref 6–20)
CALCIUM: 8.1 mg/dL — AB (ref 8.9–10.3)
CHLORIDE: 102 mmol/L (ref 101–111)
CO2: 27 mmol/L (ref 22–32)
CREATININE: 0.72 mg/dL (ref 0.44–1.00)
GFR calc Af Amer: >60 mL/min (ref >60)
Glucose, Bld: 166 mg/dL — ABNORMAL HIGH (ref 65–99)
Potassium: 3.9 mmol/L (ref 3.5–5.1)
Sodium: 135 mmol/L (ref 135–145)
Total Protein: 5.8 g/dL — ABNORMAL LOW (ref 6.5–8.1)

## 2016-12-01 LAB — GLUCOSE, CAPILLARY
GLUCOSE-CAPILLARY: 121 mg/dL — AB (ref 65–99)
GLUCOSE-CAPILLARY: 124 mg/dL — AB (ref 65–99)
GLUCOSE-CAPILLARY: 128 mg/dL — AB (ref 65–99)
GLUCOSE-CAPILLARY: 134 mg/dL — AB (ref 65–99)
GLUCOSE-CAPILLARY: 143 mg/dL — AB (ref 65–99)
Glucose-Capillary: 149 mg/dL — ABNORMAL HIGH (ref 65–99)
Glucose-Capillary: 177 mg/dL — ABNORMAL HIGH (ref 65–99)
Glucose-Capillary: 130 mg/dL — ABNORMAL HIGH (ref 65–99)

## 2016-12-01 LAB — PHOSPHORUS: Phosphorus: 3.3 mg/dL (ref 2.5–4.6)

## 2016-12-01 LAB — CBC
HEMATOCRIT: 28.7 % — AB (ref 36.0–46.0)
HEMOGLOBIN: 9.4 g/dL — AB (ref 12.0–15.0)
MCH: 28.7 pg (ref 26.0–34.0)
MCHC: 32.8 g/dL (ref 30.0–36.0)
MCV: 87.8 fL (ref 78.0–100.0)
PLATELETS: 362 10*3/uL (ref 150–400)
RBC: 3.27 MIL/uL — AB (ref 3.87–5.11)
RDW: 13.2 % (ref 11.5–15.5)
WBC: 7.2 10*3/uL (ref 4.0–10.5)

## 2016-12-01 LAB — MAGNESIUM: MAGNESIUM: 1.8 mg/dL (ref 1.7–2.4)

## 2016-12-01 MED ORDER — TRACE MINERALS CR-CU-MN-SE-ZN 10-1000-500-60 MCG/ML IV SOLN
INTRAVENOUS | Status: AC
  Administered 2016-12-01: 17:00:00 via INTRAVENOUS
  Filled 2016-12-01: qty 1920

## 2016-12-01 MED ORDER — FAT EMULSION 20 % IV EMUL
240.0000 mL | INTRAVENOUS | Status: AC
  Administered 2016-12-01: 240 mL via INTRAVENOUS
  Filled 2016-12-01: qty 250

## 2016-12-01 NOTE — Progress Notes (Signed)
[facility]  New patient for [facility] this hospital admission.  [facility] will provide [facility], PT and Home Infusion Pharmacy services for home TPN.  [facility] Infusion Coordinator will work with pt and her daughter to provide in hospital teaching regarding TPN administration at home to support independence.  If patient discharges after hours, please call [phone].   Larry Sierras 12/01/2016, 3:00 PM

## 2016-12-01 NOTE — Progress Notes (Signed)
[place] NOTE   Pharmacy Consult for TPN Indication: Intolerance to enteral feeding  Patient Measurements: Height: 5\' 5"  (165.1 cm) Weight: 129 lb 1.6 oz (58.6 kg) IBW/kg (Calculated) : 57 TPN AdjBW (KG): 65.5 Body mass index is 21.48 kg/m.   Assessment:  76yo female with Pancreatic cancer with pre-op evaluation of moderate Protein Calorie Malnutrition on 10/28/16.  Pt was admitted on 11/13/16 is now pod#16 s/p Whipple procedure and placement of J-tube.  Pt has been on Vital-AF but has not been able to tolerate advancement beyond 30 ml/hr (goal 60 ml/hr) and is not meeting nutritional goals per calorie count.  She is currently on a soft diet, but only able to tolerate 2-3 bites of food.  She is to start TPN with hopes of being able to advance po intake over the week.  GI:  S/P whipple procedure 11/13/16.  TF stopped 8/26 AM.  Per discussion with pt 8/27, she is feeling better without tube & more able to swallow.   She ate ~1/4 of dinner & breakfast but likes the food; she is not hungry but is trying to eat.  Pt agreed to try to eat 1/3 to 1/2 of her lunch and breakfast today so we can try to decrease her TPN rate some (tomorrow) which may help stimulate hunger.  Ensure TID- refusing.  (-)N/V, BM x 1.  Creon q4 hours, PPI-po, soft diet. Endo:  Sensitive SSI.  CBGs 149-200  Insulin requirements in the past 24 hours: 17 Lytes: Na 135- stable, K 3.9, Phos 3.3, Mg 1.8 Renal:  Cr 0.7, UOP x 4 Pulm:  RA Cards: Ao insufficiency, HTN, hx Rheumatic heart dz.  BP high, hr wnl Hepatobil:  LFTs wnl.  Palb is low indicating nutritional deficit, and TG wnl Neuro: low dose Benadryl for anxiety ID:  Best Practices:  Mouth care, SCDs TPN Access:  PICC  TPN start date:  8/25 >>  Nutritional Goals (per RD recommendation on 11/27/16): KCal:  1700-1800 kcal Protein: 105-115g  Current Nutrition:  Clinimix-E 5/15 at 75 ml/hr + 20% Lipid emulsion Soft diet  Plan:   Change Clinimix-E 5/15 to 68ml/hr Add MVI and Trace elements to TPN Add insulin 15 units to TPN Continue 20% Lipid emulsion at 43ml/hr over 12hr TPN + TF at above rates will provide 1838 kcal and 96 g protein, providing just over 100% of caloric and 91% of protein goals TPN labs q Mon/Thurs Change to moderate SSI q4 Change Creon to with meals only, now that TF off?    Lewie Chamber., PharmD Clinical Pharmacist [facility]

## 2016-12-01 NOTE — Progress Notes (Signed)
Nutrition Follow-up  DOCUMENTATION CODES:   Non-severe (moderate) malnutrition in context of chronic illness  INTERVENTION:   -D/c Ensure Enlive po TID, each supplement provides 350 kcal and 20 grams of protein, due to poor acceptance -TPN management per pharmacy  NUTRITION DIAGNOSIS:   Malnutrition (moderate) related to chronic illness (pancreatic cancer) as evidenced by mild depletion of muscle mass, mild depletion of body fat, percent weight loss, energy intake < 75% for > or equal to 1 month.  Ongoing  GOAL:   Patient will meet greater than or equal to 90% of their needs  Progressing  MONITOR:   PO intake, Supplement acceptance, Labs, Weight trends, Skin, I & O's  REASON FOR ASSESSMENT:    (New TPN)    ASSESSMENT:   76 yo female with PMH of rheumatoid arthritis, HTN, rheumatic heart disease, mitral regurgitation, aortic insufficiency who was admitted on 8/9 with pancreatic cancer. S/P Whipple procedure with placement of jejunostomy on 8/9.  8/12- NGT removed, CLD started, x-fer from ICU to floor 8/13- TF (Vital AF 1.2) increased to 20 ml/hr (providing 576 kcals, 36 grams protein, and 406 ml free water- meeting 36% of estimated energy needs and 45% of estimated protein intake) 8/18- TF held due to nausea and vomiting 8/19- TF restarted, stopped due to vomiting 8/20- advanced to a soft diet 8/21- calorie cont completed; pt consuming only 29% of estimated kcal needs on average 8/23- TF restarted 8/25- TPN inititated 8/26- TF off due to family's request (nausea)  TF remains on hold related to pt and family request (nausea and bloating). Pt has been trying to eat food on soft diet- noted 50% meal completion at breakfast today. She has been refusing Ensure supplements.   Pt continues to receive TPN (Clinimix-E 5/15 to 47ml/hr and20% Lipid emulsion to run at 63ml/hr over 12hr). TPN will provide 1758 kcal and 90g protein, meeting 100% of caloric and 86% of protein goals.    Per [facility] notes, pt approaching discharge.  Labs reviewed: CBGS: 128-200.   Diet Order:  DIET SOFT Room service appropriate? Yes; Fluid consistency: Thin TPN (CLINIMIX-E) Adult TPN (CLINIMIX-E) Adult  Skin:   (closed abdominal incision)  Last BM:  8/25  Height:   Ht Readings from Last 1 Encounters:  11/16/16 5\' 5"  (1.651 m)    Weight:   Wt Readings from Last 1 Encounters:  12/01/16 129 lb 1.6 oz (58.6 kg)    Ideal Body Weight:  56.8 kg  BMI:  Body mass index is 21.48 kg/m.  Estimated Nutritional Needs:   Kcal:  1600-1800  Protein:  80-90 gm  Fluid:  1.6-1.8 L  EDUCATION NEEDS:   No education needs identified at this time  Lanijah Warzecha A. Jimmye Norman, RD, LDN, CDE Pager: [phone] After hours Pager: [phone]

## 2016-12-01 NOTE — Care Management Note (Signed)
Case Management Note  Patient Details  Name: ARVIS MIGUEZ MRN: [id number] Date of Birth: 1940-10-01  Subjective/Objective:                    Action/Plan:  Consult for home health PT/RN.   Spoke to patient at bedside . Patient recently moved in with her daughter:  [place] [id number]  Phone numbers on face sheet confirmed ( patient had her home phone number transferred to daughters home) .  Patient has a walker and cane at home, and does not feel that she needs 3 in 1.   Offered choice for home health agency and infusion company . Patient would like [facility].  Expected Discharge Date:                  Expected Discharge Plan:  [place]  In-House Referral:     Discharge planning Services  CM Consult  Post Acute Care Choice:  Home Health Choice offered to:  Patient  DME Arranged:    DME Agency:     HH Arranged:  RN, PT [place] Agency:  [place]  Status of Service:  In process, will continue to follow  If discussed at Long Length of Stay Meetings, dates discussed:    Additional Comments:  Marilu Favre, RN 12/01/2016, 1:21 PM

## 2016-12-01 NOTE — Progress Notes (Signed)
18 Days Post-Op  Subjective: Pt has continued to have horrible pain and nausea once tube feeds get to around 20-30 ml/hr.  The nausea and pain last for quite a while even after turned off.  Feels fine on diet as tolerated (around 20-25% caloric/fluid needs) and TNA.    Objective: Vital signs in last 24 hours: Temp:  [98.4 F (36.9 C)-98.7 F (37.1 C)] 98.4 F (36.9 C) (08/27 0546) Pulse Rate:  [73-83] 83 (08/27 0546) Resp:  [18] 18 (08/27 0546) BP: (145-181)/(63-66) 161/66 (08/27 0546) SpO2:  [99 %-100 %] 100 % (08/27 0546) Weight:  [58.6 kg (129 lb 1.6 oz)-58.7 kg (129 lb 6.4 oz)] 58.6 kg (129 lb 1.6 oz) (08/27 0500) Last BM Date: 11/30/16  Intake/Output from previous day: 08/26 0701 - 08/27 0700 In: 1059.3 [I.V.:1059.3] Out: -  Intake/Output this shift: Total I/O In: 340 [P.O.:340] Out: 0   General appearance: alert.  Very pleasant.  No distress.  Deconditioned. Resp: breathing comfortably GI: soft.  Nondistended.  Nontender.  Wound clean.  Lab Results:  Results for orders placed or performed during the hospital encounter of 11/13/16 (from the past 24 hour(s))  Glucose, capillary     Status: Abnormal   Collection Time: 11/30/16  4:58 PM  Result Value Ref Range   Glucose-Capillary 194 (H) 65 - 99 mg/dL  Glucose, capillary     Status: Abnormal   Collection Time: 11/30/16  7:54 PM  Result Value Ref Range   Glucose-Capillary 200 (H) 65 - 99 mg/dL  Glucose, capillary     Status: Abnormal   Collection Time: 12/01/16 12:39 AM  Result Value Ref Range   Glucose-Capillary 128 (H) 65 - 99 mg/dL  Glucose, capillary     Status: Abnormal   Collection Time: 12/01/16  4:31 AM  Result Value Ref Range   Glucose-Capillary 149 (H) 65 - 99 mg/dL  CBC     Status: Abnormal   Collection Time: 12/01/16  4:36 AM  Result Value Ref Range   WBC 7.2 4.0 - 10.5 K/uL   RBC 3.27 (L) 3.87 - 5.11 MIL/uL   Hemoglobin 9.4 (L) 12.0 - 15.0 g/dL   HCT 28.7 (L) 36.0 - 46.0 %   MCV 87.8 78.0 - 100.0  fL   MCH 28.7 26.0 - 34.0 pg   MCHC 32.8 30.0 - 36.0 g/dL   RDW 13.2 11.5 - 15.5 %   Platelets 362 150 - 400 K/uL  Comprehensive metabolic panel     Status: Abnormal   Collection Time: 12/01/16  4:36 AM  Result Value Ref Range   Sodium 135 135 - 145 mmol/L   Potassium 3.9 3.5 - 5.1 mmol/L   Chloride 102 101 - 111 mmol/L   CO2 27 22 - 32 mmol/L   Glucose, Bld 166 (H) 65 - 99 mg/dL   BUN 12 6 - 20 mg/dL   Creatinine, Ser 0.72 0.44 - 1.00 mg/dL   Calcium 8.1 (L) 8.9 - 10.3 mg/dL   Total Protein 5.8 (L) 6.5 - 8.1 g/dL   Albumin 2.4 (L) 3.5 - 5.0 g/dL   AST 21 15 - 41 U/L   ALT 12 (L) 14 - 54 U/L   Alkaline Phosphatase 84 38 - 126 U/L   Total Bilirubin 0.8 0.3 - 1.2 mg/dL   GFR calc non Af Amer >60 >60 mL/min   GFR calc Af Amer >60 >60 mL/min   Anion gap 6 5 - 15  Magnesium     Status: None  Collection Time: 12/01/16  4:36 AM  Result Value Ref Range   Magnesium 1.8 1.7 - 2.4 mg/dL  Phosphorus     Status: None   Collection Time: 12/01/16  4:36 AM  Result Value Ref Range   Phosphorus 3.3 2.5 - 4.6 mg/dL  Differential     Status: Abnormal   Collection Time: 12/01/16  4:36 AM  Result Value Ref Range   Neutrophils Relative % 56 %   Neutro Abs 4.1 1.7 - 7.7 K/uL   Lymphocytes Relative 21 %   Lymphs Abs 1.5 0.7 - 4.0 K/uL   Monocytes Relative 16 %   Monocytes Absolute 1.2 (H) 0.1 - 1.0 K/uL   Eosinophils Relative 6 %   Eosinophils Absolute 0.5 0.0 - 0.7 K/uL   Basophils Relative 1 %   Basophils Absolute 0.0 0.0 - 0.1 K/uL  Glucose, capillary     Status: Abnormal   Collection Time: 12/01/16  7:53 AM  Result Value Ref Range   Glucose-Capillary 177 (H) 65 - 99 mg/dL   Comment 1 Notify RN   Glucose, capillary     Status: Abnormal   Collection Time: 12/01/16 12:01 PM  Result Value Ref Range   Glucose-Capillary 134 (H) 65 - 99 mg/dL   Comment 1 Notify RN      Studies/Results: No results found.  [name] acetaminophen (TYLENOL) oral liquid 160 mg/5 mL  650 mg Oral QID  . feeding  supplement (ENSURE ENLIVE)  237 mL Oral TID BM  . insulin aspart  0-15 Units Subcutaneous Q4H  . lipase/protease/amylase  36,000 Units Oral Q4H  . mouth rinse  15 mL Mouth Rinse BID  . pantoprazole  40 mg Oral BID     Assessment/Plan: s/p Procedure(s): ATTEMPTED DAGNOSTIC LAPAROSCOPY WHIPPLE PROCEDURE PLACEMENT JEJUNOSTOMY TUBE  POD #17.  Whipple procedure. Intolerance to tube feedings. Declines increasing tube feed rate due to bloating. Inadequate oral intake Protein calorie malnutrition Otherwise stable without additional intra-abdominal complication  TNA started. Plan home with TNA and diet as tolerated.  Anticipate from 3-8 weeks on IV nutrition depending on intake. Stable for d/c as early as tomorrow if OK with pharmacy.   Ampullary carcinoma.  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      LOS: 18 days    Mackay Hanauer 12/01/2016  . .prob

## 2016-12-02 LAB — GLUCOSE, CAPILLARY
GLUCOSE-CAPILLARY: 169 mg/dL — AB (ref 65–99)
GLUCOSE-CAPILLARY: 171 mg/dL — AB (ref 65–99)
GLUCOSE-CAPILLARY: 174 mg/dL — AB (ref 65–99)
Glucose-Capillary: 112 mg/dL — ABNORMAL HIGH (ref 65–99)
Glucose-Capillary: 141 mg/dL — ABNORMAL HIGH (ref 65–99)
Glucose-Capillary: 110 mg/dL — ABNORMAL HIGH (ref 65–99)

## 2016-12-02 LAB — BASIC METABOLIC PANEL
Anion gap: 5 (ref 5–15)
BUN: 16 mg/dL (ref 6–20)
CALCIUM: 8.3 mg/dL — AB (ref 8.9–10.3)
CO2: 26 mmol/L (ref 22–32)
CREATININE: 0.54 mg/dL (ref 0.44–1.00)
Chloride: 102 mmol/L (ref 101–111)
GFR calc Af Amer: >60 mL/min (ref >60)
GLUCOSE: 130 mg/dL — AB (ref 65–99)
Potassium: 4.1 mmol/L (ref 3.5–5.1)
Sodium: 133 mmol/L — ABNORMAL LOW (ref 135–145)

## 2016-12-02 MED ORDER — PANCRELIPASE (LIP-PROT-AMYL) 36000-114000 UNITS PO CPEP: 36000.0000 [IU] | ORAL_CAPSULE | Freq: Three times a day (TID) | ORAL | 3 refills | Status: DC

## 2016-12-02 MED ORDER — TRACE MINERALS CR-CU-MN-SE-ZN 10-1000-500-60 MCG/ML IV SOLN
INTRAVENOUS | Status: AC
  Administered 2016-12-02: 19:00:00 via INTRAVENOUS
  Filled 2016-12-02: qty 1920

## 2016-12-02 MED ORDER — PROCHLORPERAZINE MALEATE 10 MG PO TABS: 10.0000 mg | ORAL_TABLET | Freq: Four times a day (QID) | ORAL | 0 refills | Status: DC | PRN

## 2016-12-02 MED ORDER — FAT EMULSION 20 % IV EMUL
240.0000 mL | INTRAVENOUS | Status: AC
  Administered 2016-12-02: 240 mL via INTRAVENOUS
  Filled 2016-12-02: qty 250

## 2016-12-02 MED ORDER — PANTOPRAZOLE SODIUM 40 MG PO TBEC: 40.0000 mg | DELAYED_RELEASE_TABLET | Freq: Two times a day (BID) | ORAL | 3 refills | Status: DC

## 2016-12-02 NOTE — Progress Notes (Signed)
Patient informed me she did not have anyone to take care of her PICC line at home , Rhonda Spencer case mang notified and Dr Barry Dienes office notified.  Yuji Walth, Tivis Ringer, RN

## 2016-12-02 NOTE — Progress Notes (Signed)
19 Days Post-Op  Subjective: Pt continues to feel well on some PO (eating 3-6 bites per meal) and TNA.    Objective: Vital signs in last 24 hours: Temp:  [98 F (36.7 C)-98.2 F (36.8 C)] 98.2 F (36.8 C) (08/28 0416) Pulse Rate:  [67-77] 77 (08/28 0416) Resp:  [18-19] 19 (08/28 0416) BP: (151-165)/(61-74) 151/61 (08/28 0416) SpO2:  [99 %-100 %] 99 % (08/28 0416) Weight:  [61.1 kg (134 lb 11.2 oz)] 61.1 kg (134 lb 11.2 oz) (08/28 0500) Last BM Date: 12/01/16  Intake/Output from previous day: 08/27 0701 - 08/28 0700 In: 3470.3 [P.O.:1180; I.V.:2230.3] Out: 0  Intake/Output this shift: No intake/output data recorded.  General appearance: alert.  Very pleasant.  No distress.  Deconditioned. Resp: breathing comfortably GI: soft.  Nondistended.  Nontender.  Wound clean.  Lab Results:  Results for orders placed or performed during the hospital encounter of 11/13/16 (from the past 24 hour(s))  Glucose, capillary     Status: Abnormal   Collection Time: 12/01/16 12:01 PM  Result Value Ref Range   Glucose-Capillary 134 (H) 65 - 99 mg/dL   Comment 1 Notify RN   Glucose, capillary     Status: Abnormal   Collection Time: 12/01/16  4:16 PM  Result Value Ref Range   Glucose-Capillary 130 (H) 65 - 99 mg/dL  Glucose, capillary     Status: Abnormal   Collection Time: 12/01/16  5:25 PM  Result Value Ref Range   Glucose-Capillary 124 (H) 65 - 99 mg/dL  Glucose, capillary     Status: Abnormal   Collection Time: 12/01/16  7:52 PM  Result Value Ref Range   Glucose-Capillary 121 (H) 65 - 99 mg/dL  Glucose, capillary     Status: Abnormal   Collection Time: 12/01/16 11:55 PM  Result Value Ref Range   Glucose-Capillary 143 (H) 65 - 99 mg/dL  Glucose, capillary     Status: Abnormal   Collection Time: 12/02/16  4:05 AM  Result Value Ref Range   Glucose-Capillary 112 (H) 65 - 99 mg/dL  Glucose, capillary     Status: Abnormal   Collection Time: 12/02/16  7:47 AM  Result Value Ref Range   Glucose-Capillary 174 (H) 65 - 99 mg/dL   Comment 1 Notify RN      Studies/Results: No results found.  [name] acetaminophen (TYLENOL) oral liquid 160 mg/5 mL  650 mg Oral QID  . insulin aspart  0-15 Units Subcutaneous Q4H  . lipase/protease/amylase  36,000 Units Oral Q4H  . mouth rinse  15 mL Mouth Rinse BID  . pantoprazole  40 mg Oral BID     Assessment/Plan: s/p Procedure(s):  DAGNOSTIC LAPAROSCOPY WHIPPLE PROCEDURE PLACEMENT JEJUNOSTOMY TUBE  POD #19.  Whipple procedure. Intolerance to tube feedings despite multiple attempts and slow increase.    Inadequate oral intake Protein calorie malnutrition Otherwise stable without additional intra-abdominal complication  Home with TNA today if [place] available.  Pancreatic cancer pT3N2   LOS: 19 days    Rhonda Spencer 12/02/2016

## 2016-12-02 NOTE — Discharge Summary (Addendum)
Physician Discharge Summary  Patient ID: Rhonda Spencer MRN: [id number] DOB/AGE: [birth year] 76 y.o.  Admit date: 11/13/2016 Discharge date: 12/04/2016  Admission Diagnoses: Patient Active Problem List   Diagnosis Date Noted  . 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 Pancreatic cancer ([place]) 10/14/2016  . Sinus bradycardia 09/23/2015  . Rheumatoid arthritis ([place]) 09/23/2015  . Tricuspid regurgitation 09/23/2015  . Near syncope 09/22/2015  . Orthostasis 09/22/2015  . Essential hypertension 09/22/2015  . Chronic pain 09/22/2015  . GERD (gastroesophageal reflux disease) 04/19/2015  . Abdominal pain, chronic, right lower quadrant 10/12/2012  . AI (aortic insufficiency) 09/09/2012  . Mitral regurgitation 09/09/2012  . Rheumatic heart disease 09/09/2012  . HTN (hypertension) 09/09/2012  . Pre-operative cardiovascular examination 09/09/2012  . 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    Discharged Condition: stable  Hospital Course:  Pt was admitted to the ICU following a pancreaticoduodenectomy for adenocarcinoma of the pancreatic head.  She had a brief episode of bradycardia on POD 0 that resolved with atropine.  Epidural was stopped as well and then removed.  She was placed on low dose dilaudid PCA.  Low dose tube feeds were started on POD 2.  NGT was removed on POD 3 and clear liquids were started. She was transferred to the floor.  Tube feeds were slowly advanced to 20 ml/hr but she had a significant episode of emesis and "horrible heartburn."  Erythromycin was added.  Tube feeds were increased and full liquids were started once she had flatus.  Tube feeds were advanced a bit more quickly, and she had significant nausea and vomiting.  Tube feeds were restarted slowly 3 times, and each time she developed periumbilical pain and  nausea/vomiting.  She did fine with small amounts of oral feeds, but felt poorly with tube feeds.  A PICC was placed and TNA was started.  She has felt well while on TNA.  She is only getting around 20% of caloric needs with PO intake.  She was going to be discharged to home with [facility] TNA, however, her family felt uncomfortable with the PICC line maintenance.   She has not needed any pain medication other than tylenol for 3-4 days.  She will be discharged to SNF when available.  She has been getting insulin in her TNA here at the hospital, but will need to get sliding scale insulin at SNF.  This is fine.  This will minimze her risk of low blood sugars.    Consults: Pharmacy  Significant Diagnostic Studies: labs: Cr 0.72, HCT 28.7, nl WBCs.    Treatments: surgery: see above.    Discharge Exam: Blood pressure (!) 154/62, pulse 83, temperature 98.8 F (37.1 C), temperature source Oral, resp. rate 16, height 5\' 5"  (1.651 m), weight 60.5 kg (133 lb 6.4 oz), SpO2 98 %. General appearance: alert, cooperative, cachectic and no distress Resp: breathing comfortably Cardio: regular rate and rhythm GI: soft, non distended, non tender, incision c/d/i.   Extremities: extremities normal, atraumatic, no cyanosis or edema  Disposition: 03-SNF  Discharge Instructions    Call MD for:  difficulty breathing, headache or visual disturbances    Complete by:  As directed    Call MD for:  difficulty breathing, headache or visual disturbances    Complete by:  As directed    Call MD for:  hives  Complete by:  As directed    Call MD for:  persistant nausea and vomiting    Complete by:  As directed    Call MD for:  persistant nausea and vomiting    Complete by:  As directed    Call MD for:  redness, tenderness, or signs of infection (pain, swelling, redness, odor or green/yellow discharge around incision site)    Complete by:  As directed    Call MD for:  redness, tenderness, or signs of infection (pain,  swelling, redness, odor or green/yellow discharge around incision site)    Complete by:  As directed    Call MD for:  severe uncontrolled pain    Complete by:  As directed    Call MD for:  severe uncontrolled pain    Complete by:  As directed    Call MD for:  temperature >100.4    Complete by:  As directed    Diet - low sodium heart healthy    Complete by:  As directed    Diet - low sodium heart healthy    Complete by:  As directed    Increase activity slowly    Complete by:  As directed    Increase activity slowly    Complete by:  As directed    No wound care    Complete by:  As directed    TPN per pharmacy consult    Complete by:  As directed      Allergies as of 12/04/2016      Reactions   Tarka [trandolapril-verapamil Hcl Er] Swelling   Slowed heart rate Pt reports allergy to any cardiac medication ending in "-il"   Atorvastatin Other (See Comments)   states it lowered her heart rate   Contrast Media [iodinated Diagnostic Agents] Itching      Medication List    STOP taking these medications   famotidine 20 MG tablet Commonly known as:  PEPCID     TAKE these medications   acetaminophen 325 MG tablet Commonly known as:  TYLENOL Take 2 tablets (650 mg total) by mouth every 4 (four) hours as needed for mild pain, moderate pain, fever or headache.   aspirin EC 81 MG tablet Take 81 mg by mouth daily.   betamethasone dipropionate 0.05 % cream Commonly known as:  DIPROLENE Apply 1 application topically daily as needed (itching).   bisacodyl 10 MG suppository Commonly known as:  DULCOLAX Place 1 suppository (10 mg total) rectally daily as needed for moderate constipation.   felodipine 5 MG 24 hr tablet Commonly known as:  PLENDIL Take 5 mg by mouth daily.   insulin aspart 100 UNIT/ML injection Commonly known as:  novoLOG Inject 0-15 Units into the skin every 4 (four) hours.   insulin aspart 100 UNIT/ML injection Commonly known as:  novoLOG Inject 0-15 Units  into the skin 3 (three) times daily with meals.   ketotifen 0.025 % ophthalmic solution Commonly known as:  ZADITOR Place 1 drop into the left eye daily as needed (irritation).   lidocaine 5 % Commonly known as:  LIDODERM Place 1 patch onto the skin daily as needed (back pain). Remove & Discard patch within 12 hours or as directed by MD   lipase/protease/amylase 36000 UNITS Cpep capsule Commonly known as:  CREON Take 1 capsule (36,000 Units total) by mouth 3 (three) times daily with meals.   Magnesium 250 MG Tabs Take 250 mg by mouth daily as needed (for leg crampsl).   pantoprazole 40  MG tablet Commonly known as:  PROTONIX Take 1 tablet (40 mg total) by mouth 2 (two) times daily.   prochlorperazine 10 MG tablet Commonly known as:  COMPAZINE Take 1 tablet (10 mg total) by mouth every 6 (six) hours as needed for nausea or vomiting (Use for nausea and / or vomiting unresolved with ondansetron (Zofran).).            Discharge Care Instructions        Start     Ordered   12/04/16 0000  acetaminophen (TYLENOL) 325 MG tablet  Every 4 hours PRN     12/04/16 0813   12/04/16 0000  insulin aspart (NOVOLOG) 100 UNIT/ML injection  3 times daily with meals     12/04/16 1236   12/03/16 0000  bisacodyl (DULCOLAX) 10 MG suppository  Daily PRN     12/03/16 0902   12/03/16 0000  Diet - low sodium heart healthy     12/03/16 0902   12/03/16 0000  Increase activity slowly     12/03/16 0902   12/03/16 0000  Call MD for:  persistant nausea and vomiting     12/03/16 0902   12/03/16 0000  Call MD for:  severe uncontrolled pain     12/03/16 0902   12/03/16 0000  Call MD for:  redness, tenderness, or signs of infection (pain, swelling, redness, odor or green/yellow discharge around incision site)     12/03/16 0902   12/03/16 0000  Call MD for:  difficulty breathing, headache or visual disturbances     12/03/16 0902   12/03/16 0000  Call MD for:  hives     12/03/16 0902   12/03/16 0000   insulin aspart (NOVOLOG) 100 UNIT/ML injection  Every 4 hours     12/03/16 1350   12/02/16 0000  lipase/protease/amylase (CREON) 36000 UNITS CPEP capsule  3 times daily with meals     12/02/16 1007   12/02/16 0000  pantoprazole (PROTONIX) 40 MG tablet  2 times daily     12/02/16 1007   12/02/16 0000  prochlorperazine (COMPAZINE) 10 MG tablet  Every 6 hours PRN     12/02/16 1007   12/02/16 0000  Diet - low sodium heart healthy     12/02/16 1007   12/02/16 0000  Increase activity slowly     12/02/16 1007   12/02/16 0000  Call MD for:  temperature >100.4     12/02/16 1007   12/02/16 0000  Call MD for:  persistant nausea and vomiting     12/02/16 1007   12/02/16 0000  Call MD for:  severe uncontrolled pain     12/02/16 1007   12/02/16 0000  Call MD for:  redness, tenderness, or signs of infection (pain, swelling, redness, odor or green/yellow discharge around incision site)     12/02/16 1007   12/02/16 0000  Call MD for:  difficulty breathing, headache or visual disturbances     12/02/16 1007   12/02/16 0000  No wound care     12/02/16 1007   12/02/16 0000  TPN per pharmacy consult    Provider:  (Not yet assigned)   12/02/16 1007      Contact information for follow-up providers    [name], MD Follow up in 2 week(s).   Specialty:  General Surgery Contact information: [place] [address] [phone]            Contact information for after-discharge care  [facility] .   Specialty:  [place] information: [address] [place] [phone]                  Signed: [name] 12/04/2016, 12:36 PM

## 2016-12-02 NOTE — Discharge Instructions (Signed)
[facility]      [facility] °[phone] ° °ABDOMINAL SURGERY: POST OP INSTRUCTIONS ° °Always review your discharge instruction sheet given to you by the facility where your surgery was performed. ° °IF YOU HAVE DISABILITY OR FAMILY LEAVE FORMS, YOU MUST BRING THEM TO THE OFFICE FOR PROCESSING.  PLEASE DO NOT GIVE THEM TO YOUR DOCTOR. ° °1. A prescription for pain medication may be given to you upon discharge.  Take your pain medication as prescribed, if needed.  If narcotic pain medicine is not needed, then you may take acetaminophen (Tylenol) or ibuprofen (Advil) as needed. °2. Take your usually prescribed medications unless otherwise directed. °3. If you need a refill on your pain medication, please contact your pharmacy. They will contact our office to request authorization.  Prescriptions will not be filled after 5pm or on week-ends. °4. You should follow a light diet the first few days after arrival home, such as soup and crackers, pudding, etc.unless your doctor has advised otherwise. A high-fiber, low fat diet can be resumed as tolerated.   Be sure to include lots of fluids daily. Most patients will experience some swelling and bruising on the chest and neck area.  Ice packs will help.  Swelling and bruising can take several days to resolve °5. Most patients will experience some swelling and bruising in the area of the incision. Ice pack will help. Swelling and bruising can take several days to resolve..  °6. It is common to experience some constipation if taking pain medication after surgery.  Increasing fluid intake and taking a stool softener will usually help or prevent this problem from occurring.  A mild laxative (Milk of Magnesia or Miralax) should be taken according to package directions if there are no bowel movements after 48 hours. °7.  You may have steri-strips (small skin tapes) in place directly over the incision.  These strips should be left on the skin for 10-14 days.  If your  surgeon used skin glue on the incision, you may shower in 48 hours.  The glue will flake off over the next 2-3 weeks.  Any sutures or staples will be removed at the office during your follow-up visit. You may find that a light gauze bandage over your incision may keep your staples from being rubbed or pulled. You may shower and replace the bandage daily. °8. ACTIVITIES:  You may resume regular (light) daily activities beginning the next day--such as daily self-care, walking, climbing stairs--gradually increasing activities as tolerated.  You may have sexual intercourse when it is comfortable.  Refrain from any heavy lifting or straining until approved by your doctor. °a. You may drive when you no longer are taking prescription pain medication, you can comfortably wear a seatbelt, and you can safely maneuver your car and apply brakes °b. Return to Work: __________8 weeks if applicable_________________________ °9. You should see your doctor in the office for a follow-up appointment approximately two weeks after your surgery.  Make sure that you call for this appointment within a day or two after you arrive home to insure a convenient appointment time. °OTHER INSTRUCTIONS:  °_____________________________________________________________ °_____________________________________________________________ ° °WHEN TO CALL YOUR DOCTOR: °1. Fever over 101.0 °2. Inability to urinate °3. Nausea and/or vomiting °4. Extreme swelling or bruising °5. Continued bleeding from incision. °6. Increased pain, redness, or drainage from the incision. °7. Difficulty swallowing or breathing °8. Muscle cramping or spasms. °9. Numbness or tingling in hands or feet or around lips. ° °The clinic staff is   available to answer your questions during regular business hours.  Please don’t hesitate to call and ask to speak to one of the nurses if you have concerns. ° °For further questions, please visit [email or web address] ° ° ° °

## 2016-12-02 NOTE — Care Management (Signed)
Patient has decided she does not want to go home with PICC line and TPN , she believes it is too much on her daughter. Patient wanting to go to SNF .   Spoke with daughter Ivin Booty via phone. Ivin Booty in agreement .  Explained SW will begin progress of SNF placement , however not all SNF take TPN. Patient and daughter voiced understanding.   Called pharmacy spoke with Rod Holler . As of now TPN will not be mixed for tonight. Rod Holler will call Miranda bedside nurse.   Left message for Pam with [facility] . Also notified Santiago Glad with [facility] .   Called [facility] office and spoke with Armm , she will notify Dr Barry Dienes .   SW Shelton Silvas also aware and will begin SNF process.    Magdalen Spatz RN BSN [phone]

## 2016-12-02 NOTE — NC FL2 (Signed)
[place] LEVEL OF CARE SCREENING TOOL     IDENTIFICATION  Patient Name: Rhonda Spencer Birthdate: Dec 01, 1940 Sex: female Admission Date (Current Location): 11/13/2016  [facility] and [place] Number:  Herbalist and Address:  The . [facility], [place] [address] [place], [place] [address]      Provider Number: [id number]  Attending Physician Name and Address:  [name], MD  Relative Name and Phone Number:       Current Level of Care: Hospital Recommended Level of Care: [place] Prior Approval Number:    Date Approved/Denied:   PASRR Number: [id number]  Discharge Plan: SNF    Current Diagnoses: Patient Active Problem List   Diagnosis Date Noted  . 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 Pancreatic cancer ([place]) 10/14/2016  . Sinus bradycardia 09/23/2015  . Rheumatoid arthritis ([place]) 09/23/2015  . Tricuspid regurgitation 09/23/2015  . Near syncope 09/22/2015  . Orthostasis 09/22/2015  . Essential hypertension 09/22/2015  . Chronic pain 09/22/2015  . GERD (gastroesophageal reflux disease) 04/19/2015  . Abdominal pain, chronic, right lower quadrant 10/12/2012  . AI (aortic insufficiency) 09/09/2012  . Mitral regurgitation 09/09/2012  . Rheumatic heart disease 09/09/2012  . HTN (hypertension) 09/09/2012  . Pre-operative cardiovascular examination 09/09/2012  . Abdominal mass, RLQ (right lower quadrant) 09/07/2012    Orientation RESPIRATION BLADDER Height & Weight     Self, Time, Situation, Place  Normal Continent Weight: 134 lb 11.2 oz (61.1 kg) Height:  5\' 5"  (165.1 cm)  BEHAVIORAL SYMPTOMS/MOOD NEUROLOGICAL BOWEL NUTRITION STATUS      Continent  (Please see d/c summary)  AMBULATORY STATUS COMMUNICATION OF NEEDS Skin   Limited Assist Verbally Surgical wounds (Closed incision, abdomen, no dressing)                       Personal Care Assistance Level of Assistance  Bathing, Feeding,  Dressing Bathing Assistance: Limited assistance Feeding assistance: Independent Dressing Assistance: Limited assistance     Functional Limitations Info  Sight, Hearing, Speech Sight Info: Adequate Hearing Info: Adequate Speech Info: Adequate    SPECIAL CARE FACTORS FREQUENCY  PT (By licensed PT), OT (By licensed OT)                    Contractures Contractures Info: Not present    Additional Factors Info  Code Status, Allergies Code Status Info: full code Allergies Info: Tarka Trandolapril-verapamil Hcl Er, Atorvastatin, Contrast Media Iodinated Diagnostic Agents           Current Medications (12/02/2016):  This is the current hospital active medication list Current Facility-Administered Medications  Medication Dose Route Frequency Provider Last Rate Last Dose  . acetaminophen (TYLENOL) solution 650 mg  650 mg Oral QID [name], MD   650 mg at 12/01/16 2224  . diphenhydrAMINE (BENADRYL) injection 12.5 mg  12.5 mg Intravenous Q8H PRN Fanny Skates, MD      . feeding supplement (VITAL AF 1.2 CAL) liquid 1,000 mL  1,000 mL Per Tube Continuous Cornett, Marcello Moores, MD   Stopped at 11/30/16 1126  . hydrALAZINE (APRESOLINE) injection 10 mg  10 mg Intravenous Q2H PRN [name], MD   10 mg at 11/30/16 2259  . insulin aspart (novoLOG) injection 0-15 Units  0-15 Units Subcutaneous Q4H Jaquita Folds, RPH   3 Units at 12/02/16 1225  . ketotifen (ZADITOR) 0.025 % ophthalmic solution 1 drop  1 drop Left Eye Daily PRN Byerly,  Dorris Fetch, MD      . labetalol (NORMODYNE,TRANDATE) injection 10 mg  10 mg Intravenous Q2H PRN Georganna Skeans, MD   10 mg at 11/16/16 0847  . lidocaine (LIDODERM) 5 % 1 patch  1 patch Transdermal Daily PRN [name], MD   1 patch at 11/26/16 0103  . lipase/protease/amylase (CREON) capsule 36,000 Units  36,000 Units Oral Q4H Fanny Skates, MD   36,000 Units at 12/02/16 1226  . MEDLINE mouth rinse  15 mL Mouth Rinse BID [name], MD   15 mL at 12/01/16  2225  . morphine 4 MG/ML injection 1-2 mg  1-2 mg Intravenous Q2H PRN [name], MD   2 mg at 11/28/16 1747  . ondansetron (ZOFRAN) injection 4 mg  4 mg Intravenous Q4H PRN Ralene Ok, MD   4 mg at 11/28/16 1723  . oxyCODONE (Oxy IR/ROXICODONE) immediate release tablet 2.5-5 mg  2.5-5 mg Oral Q4H PRN [name], MD      . pantoprazole (PROTONIX) EC tablet 40 mg  40 mg Oral BID [name], MD   40 mg at 12/02/16 0839  . prochlorperazine (COMPAZINE) tablet 10 mg  10 mg Oral Q6H PRN [name], MD       Or  . prochlorperazine (COMPAZINE) injection 5-10 mg  5-10 mg Intravenous Q6H PRN [name], MD   10 mg at 11/28/16 1419  . sodium chloride flush (NS) 0.9 % injection 10-40 mL  10-40 mL Intracatheter PRN Fanny Skates, MD   10 mL at 11/29/16 1803  . TPN (CLINIMIX-E) Adult   Intravenous Continuous TPN Jaquita Folds, RPH 80 mL/hr at 12/01/16 1727    . triamcinolone cream (KENALOG) 0.5 %   Topical BID PRN [name], MD         Discharge Medications: Please see discharge summary for a list of discharge medications.  Relevant Imaging Results:  Relevant Lab Results:   Additional Information SSN: [id number]. Pt needs administered TNA  Zayra Devito A Kaneshia Cater, LCSW

## 2016-12-02 NOTE — Progress Notes (Addendum)
[place] NOTE   Pharmacy Consult for TPN Indication: Intolerance to enteral feeding  Patient Measurements: Height: 5\' 5"  (165.1 cm) Weight: 134 lb 11.2 oz (61.1 kg) IBW/kg (Calculated) : 57 TPN AdjBW (KG): 65.5 Body mass index is 22.42 kg/m.   Assessment:  76yo female with Pancreatic cancer with pre-op evaluation of moderate Protein Calorie Malnutrition on 10/28/16.  Pt was admitted on 11/13/16 is now pod#16 s/p Whipple procedure and placement of J-tube.  Pt has been on Vital-AF but has not been able to tolerate advancement beyond 30 ml/hr (goal 60 ml/hr) and is not meeting nutritional goals per calorie count.  She is currently on a soft diet, but only able to tolerate 2-3 bites of food.  She is to start TPN with hopes of being able to advance po intake over the week.  GI:  S/P whipple procedure 11/13/16.  TF stopped 8/26 AM.  Patient has said she is feeling better without tube feeding & able to swallow more. Able to eat 2-3 bites of meals; however, is not hungry. (-) N/V, abdominal distention. Talked about if able to eat more orally in future will be able to decrease rate to help stimulate hunger.  She hasn't been receiving any Vital AF 1.2 Cal supplement since ordered. BM x 2.  Creon q4 hours, PPI-po, soft diet. Endo:  Sensitive SSI.  CBGs 112-174  Insulin requirements in the past 24 hours: 10 SSI + 15 units in TPN Lytes: BMP on 8/27>>Na 135- stable, K 3.9, Phos 3.3, Mg 1.8 Renal:  Cr 0.7, UOP x 5 Pulm:  RA Cards: Ao insufficiency, HTN, hx Rheumatic heart dz.  BP high, hr wnl -has prn agents for HTN Hepatobil:  LFTs wnl.  Palb is low indicating nutritional deficit, and TG wnl Neuro: low dose Benadryl for anxiety; pain controlled w/ prn morphine and oxycodone ID:  Best Practices:  Mouth care, SCDs TPN Access:  PICC  TPN start date:  8/25 >>  Nutritional Goals (per RD recommendation on 11/27/16): KCal:  1700-1800 kcal Protein: 105-115g  Current  Nutrition:  Clinimix-E 5/15 at 80 ml/hr + 20% Lipid emulsion Soft diet  Plan:  -Continue Clinimix-E 5/15 to 49ml/hr -Add MVI and Trace elements to TPN -Continue insulin 15 units to TPN -Continue 20% Lipid emulsion at 8ml/hr over 12hr -TPN will provide 1843 kcal and 96 g protein, providing just over 100% of caloric and 91% of protein goals -TPN labs q Mon/Thurs -Continue moderate SSI q4 -Change Creon to with meals only, now that TF off?  Patient is not leaving tonight so will make TPN bag.   Doylene Canard, PharmD Clinical Pharmacist  Pager: [facility]

## 2016-12-03 LAB — GLUCOSE, CAPILLARY
GLUCOSE-CAPILLARY: 109 mg/dL — AB (ref 65–99)
GLUCOSE-CAPILLARY: 222 mg/dL — AB (ref 65–99)
GLUCOSE-CAPILLARY: 98 mg/dL (ref 65–99)
Glucose-Capillary: 171 mg/dL — ABNORMAL HIGH (ref 65–99)
Glucose-Capillary: 190 mg/dL — ABNORMAL HIGH (ref 65–99)
Glucose-Capillary: 224 mg/dL — ABNORMAL HIGH (ref 65–99)

## 2016-12-03 MED ORDER — FAT EMULSION 20 % IV EMUL
240.0000 mL | INTRAVENOUS | Status: AC
  Administered 2016-12-03: 240 mL via INTRAVENOUS
  Filled 2016-12-03: qty 250

## 2016-12-03 MED ORDER — BISACODYL 10 MG RE SUPP: 10.0000 mg | Freq: Every day | RECTAL | 0 refills | Status: DC | PRN

## 2016-12-03 MED ORDER — TRACE MINERALS CR-CU-MN-SE-ZN 10-1000-500-60 MCG/ML IV SOLN
INTRAVENOUS | Status: DC
  Administered 2016-12-03: 17:00:00 via INTRAVENOUS
  Filled 2016-12-03: qty 1924

## 2016-12-03 MED ORDER — INSULIN ASPART 100 UNIT/ML ~~LOC~~ SOLN: 0.0000 [IU] | SUBCUTANEOUS | 11 refills | Status: DC

## 2016-12-03 MED ORDER — BISACODYL 10 MG RE SUPP: 10.0000 mg | Freq: Every day | RECTAL | Status: DC | PRN

## 2016-12-03 NOTE — Clinical Social Work Note (Signed)
At this time the only facility that will take pt and administer TPN is [facility]. However, [facility] has to order TPN and pt will not be able to transfer to facility until 8/30.   [place], [place]

## 2016-12-03 NOTE — Clinical Social Work Placement (Addendum)
   CLINICAL SOCIAL WORK PLACEMENT  NOTE  Date:  12/03/2016  Patient Details  Name: Rhonda Spencer MRN: [id number] Date of Birth: 1940-05-31  Clinical Social Work is seeking post-discharge placement for this patient at the [place] level of care (*CSW will initial, date and re-position this form in  chart as items are completed):      Patient/family provided with [place] Work Department's list of facilities offering this level of care within the geographic area requested by the patient (or if unable, by the patient's family).  Yes   Patient/family informed of their freedom to choose among providers that offer the needed level of care, that participate in Medicare, Medicaid or managed care program needed by the patient, have an available bed and are willing to accept the patient.      Patient/family informed of [facility]'s ownership interest in [facility] and [facility], as well as of the fact that they are under no obligation to receive care at these facilities.  PASRR submitted to EDS on       PASRR number received on 12/02/16     Existing PASRR number confirmed on       FL2 transmitted to all facilities in geographic area requested by pt/family on 12/02/16     FL2 transmitted to all facilities within larger geographic area on       Patient informed that his/her managed care company has contracts with or will negotiate with certain facilities, including the following:        Yes   Patient/family informed of bed offers received.  Patient chooses bed at [facility]     Physician recommends and patient chooses bed at      Patient to be transferred to [facility] on 12/04/16.  Patient to be transferred to facility by [facility]     Patient family notified on   of transfer.  Name of family member notified:        PHYSICIAN Please prepare priority discharge summary, including medications, Please prepare prescriptions,  Please sign FL2     Additional Comment:    _______________________________________________ Eileen Stanford, LCSW 12/03/2016, 11:42 AM

## 2016-12-03 NOTE — Progress Notes (Signed)
Nutrition Follow-up  DOCUMENTATION CODES:   Non-severe (moderate) malnutrition in context of chronic illness  INTERVENTION:   -TPN management per pharmacy  NUTRITION DIAGNOSIS:   Malnutrition (moderate) related to chronic illness (pancreatic cancer) as evidenced by mild depletion of muscle mass, mild depletion of body fat, percent weight loss, energy intake < 75% for > or equal to 1 month.  Ongoing  GOAL:   Patient will meet greater than or equal to 90% of their needs  Met with TPN  MONITOR:   PO intake, Supplement acceptance, Labs, Weight trends, Skin, I & O's  REASON FOR ASSESSMENT:    (New TPN)    ASSESSMENT:   76 yo female with PMH of rheumatoid arthritis, HTN, rheumatic heart disease, mitral regurgitation, aortic insufficiency who was admitted on 8/9 with pancreatic cancer. S/P Whipple procedure with placement of jejunostomy on 8/9.  8/12- NGT removed, CLD started, x-fer from ICU to floor 8/13- TF (Vital AF 1.2) increased to 20 ml/hr (providing 576 kcals, 36 grams protein, and 406 ml free water- meeting 36% of estimated energy needs and 45%of estimated protein intake) 8/18- TF held due to nausea and vomiting 8/19- TF restarted, stopped due to vomiting 8/20- advanced to a soft diet 8/21- calorie cont completed; pt consuming only 29% of estimated kcal needs on average 8/23- TF restarted 8/25- TPN inititated 8/26- TF off due to family's request (nausea)  Pt receiving nursing care at time of visit.   Pt's intake is improving; noted 50-75% meal completion.   Pt remains TPN dependent. Currently receiving Clinimix-E 5/15 (transition to cyclic today cycle over 18hr): 62m/hr x 1, then increase to 114 ml/hr x 16hr, then decrease to 59mhr x 1hr and d/c, with20% lipid emulsion at 203mr over 12 hr. TPN will provide 1846 kcal and 96 g protein, providing just over 100% of caloric and 91% of protein goals.   Per CSW note[facility]   Labs reviewed: CBGS: 109-171. Inpatient orders for glycemic control: 0-15 units every 4 hours and 15 unirs insulin added to TPN.   Diet Order:  DIET SOFT Room service appropriate? Yes; Fluid consistency: Thin Diet - low sodium heart healthy TPN (CLINIMIX-E) Adult Diet - low sodium heart healthy TPN (CLINIMIX-E) Adult  Skin:   (closed abdominal incision)  Last BM:  8/25  Height:   Ht Readings from Last 1 Encounters:  11/16/16 _0  (1.651 m)    Weight:   Wt Readings from Last 1 Encounters:  12/03/16 133 lb 6.4 oz (60.5 kg)    Ideal Body Weight:  56.8 kg  BMI:  Body mass index is 22.2 kg/m.  Estimated Nutritional Needs:   Kcal:  1600-1800  Protein:  80-90 gm  Fluid:  1.6-1.8 L  EDUCATION NEEDS:   No education needs identified at this time  Yardley Lekas A. WilJimmye NormanD, LDN, CDE Pager: [phone] hours Pager: [phone]

## 2016-12-03 NOTE — Clinical Social Work Note (Signed)
Clinical Social Work Assessment  Patient Details  Name: Rhonda Spencer MRN: [id number] Date of Birth: 10-08-1940  Date of referral:  12/03/16               Reason for consult:  Facility Placement                Permission sought to share information with:  Family Supports Permission granted to share information::     Name::        Agency::     Relationship::     Contact Information:     Housing/Transportation Living arrangements for the past 2 months:  Single Family Home Source of Information:  Patient Patient Interpreter Needed:  None Criminal Activity/Legal Involvement Pertinent to Current Situation/Hospitalization:  No - Comment as needed Significant Relationships:  Adult Children, Other Family Members Lives with:  Adult Children Do you feel safe going back to the place where you live?    Need for family participation in patient care:     Care giving concerns:  Pt's nephew present at bedside.   Social Worker assessment / plan:  CSW spoke with pt at bedside to complete initial assessment. Pt lives at home with her daughter, however daughter works and would be unable to care for pt in terms of her TPN as needed. Pt is worried this is too much for the daughter. Pt wants to go to SNF. CSW explained the selection would be very limited as very few snfs administer TPN. CSW reached out to facilities and at this time [facility] only facility who will take pt w/TPN at this time. Pt is agreeable. CSW working with facility to determine when TPN will be delivered.  Employment status:  Retired Forensic scientist:  Medicare PT Recommendations:  No Follow Up (Pt need SNF for TPN) Information / Referral to community resources:  [place]  Patient/Family's Response to care:  Pt verbalized understanding of CSW role and expressed appreciation for support. Pt denies any concern regarding pt care at this time.   Patient/Family's Understanding of and Emotional Response to  Diagnosis, Current Treatment, and Prognosis:  Pt understanding and realistic regarding physical limitations. Pt understands the need for SNF placement at d/c. Pt agreeable to SNF placement at d/c, at this time. Pt's responses emotionally appropriate during conversation with CSW. Pt denies any concern regarding treatment plan at this time. CSW will continue to provide support and facilitate d/c needs.   Emotional Assessment Appearance:  Appears stated age Attitude/Demeanor/Rapport:   (Patient was appropriate.) Affect (typically observed):  Accepting, Appropriate, Calm Orientation:  Oriented to Situation, Oriented to Place, Oriented to  Time, Oriented to Self Alcohol / Substance use:  Not Applicable Psych involvement (Current and /or in the community):  No (Comment)  Discharge Needs  Concerns to be addressed:   (TPN management) Readmission within the last 30 days:  No Current discharge risk:   (TPN management) Barriers to Discharge:   (Facility ordering TPN)   Gerrianne Scale Gerber Penza, LCSW 12/03/2016, 11:24 AM

## 2016-12-03 NOTE — Progress Notes (Addendum)
[place] NOTE   Pharmacy Consult for TPN Indication: Intolerance to enteral feeding  Patient Measurements: Height: 5\' 5"  (165.1 cm) Weight: 133 lb 6.4 oz (60.5 kg) IBW/kg (Calculated) : 57 TPN AdjBW (KG): 65.5 Body mass index is 22.2 kg/m.   Assessment:  76yo female with Pancreatic cancer with pre-op evaluation of moderate Protein Calorie Malnutrition on 10/28/16.  Pt was admitted on 11/13/16 is now pod#16 s/p Whipple procedure and placement of J-tube.  Pt has been on Vital-AF but has not been able to tolerate advancement beyond 30 ml/hr (goal 60 ml/hr) and is not meeting nutritional goals per calorie count.  She is currently on a soft diet, but only able to tolerate 2-3 bites of food.  She is to start TPN with hopes of being able to advance po intake over the week.  Pt did receive TPN on 12/02/16.  Will begin transition to cyclic TPN.  GI:  S/P whipple procedure 11/13/16.  TF stopped 8/26 AM, feeling better without tube feeding & able to swallow more. Able to eat 3-6 bites of meals; however, is not hungry. (-) N/V, abdominal distention.  I spoke with pt this AM, she is bloated this AM and could not eat breakfast.   BM x 2.  Creon q4 hours, PPI-po, soft diet. Endo:  Sensitive SSI.  CBGs 110-171  Insulin requirements in the past 24 hours: 14 SSI + 15 units in TPN Lytes: BMP on 8/27>>Na 135- stable, K 3.9, Phos 3.3, Mg 1.8.  No labs 8/29 Renal:  Cr 0.7, UOP x 2 Pulm:  RA Cards: Ao insufficiency, HTN, hx Rheumatic heart dz.  BP high, hr wnl -has prn agents for HTN Hepatobil:  LFTs wnl.  Palb is low indicating nutritional deficit, and TG wnl Neuro: low dose Benadryl for anxiety; pain controlled w/ prn morphine and oxycodone ID:  Best Practices:  Mouth care, SCDs TPN Access:  PICC  TPN start date:  8/25 >>  Nutritional Goals (per RD recommendation on 11/27/16): KCal:  1700-1800 kcal Protein: 105-115g  Current Nutrition:  Clinimix-E 5/15 at 80  ml/hr + 20% Lipid emulsion Soft diet  Plan:  -Continue Clinimix-E 5/15 to cycle over 18hr: 54ml/hr x 1, then increase to 114 ml/hr x 16hr, then decrease to 75ml/hr x 1hr and d/c.   -Add MVI and Trace elements to TPN -Continue insulin 15 units to TPN -Continue 20% Lipid emulsion at 23ml/hr over 12hr -TPN will provide 1846 kcal and 96 g protein, providing just over 100% of caloric and 91% of protein goals -TPN labs q Mon/Thurs -Continue moderate SSI q4   Lewie Chamber., PharmD Clinical Pharmacist [facility] [phone] before 3p, then call [phone]

## 2016-12-04 DIAGNOSIS — Z23 Encounter for immunization: Secondary | ICD-10-CM | POA: Diagnosis present

## 2016-12-04 DIAGNOSIS — D638 Anemia in other chronic diseases classified elsewhere: Secondary | ICD-10-CM | POA: Diagnosis present

## 2016-12-04 DIAGNOSIS — I361 Nonrheumatic tricuspid (valve) insufficiency: Secondary | ICD-10-CM | POA: Diagnosis not present

## 2016-12-04 DIAGNOSIS — Z452 Encounter for adjustment and management of vascular access device: Secondary | ICD-10-CM | POA: Diagnosis not present

## 2016-12-04 DIAGNOSIS — C801 Malignant (primary) neoplasm, unspecified: Secondary | ICD-10-CM | POA: Diagnosis not present

## 2016-12-04 DIAGNOSIS — I083 Combined rheumatic disorders of mitral, aortic and tricuspid valves: Secondary | ICD-10-CM | POA: Diagnosis present

## 2016-12-04 DIAGNOSIS — M546 Pain in thoracic spine: Secondary | ICD-10-CM | POA: Diagnosis not present

## 2016-12-04 DIAGNOSIS — N390 Urinary tract infection, site not specified: Secondary | ICD-10-CM | POA: Diagnosis present

## 2016-12-04 DIAGNOSIS — J189 Pneumonia, unspecified organism: Secondary | ICD-10-CM | POA: Diagnosis not present

## 2016-12-04 DIAGNOSIS — E43 Unspecified severe protein-calorie malnutrition: Secondary | ICD-10-CM | POA: Diagnosis not present

## 2016-12-04 DIAGNOSIS — R1903 Right lower quadrant abdominal swelling, mass and lump: Secondary | ICD-10-CM | POA: Diagnosis not present

## 2016-12-04 DIAGNOSIS — Y95 Nosocomial condition: Secondary | ICD-10-CM | POA: Diagnosis not present

## 2016-12-04 DIAGNOSIS — Z87891 Personal history of nicotine dependence: Secondary | ICD-10-CM | POA: Diagnosis not present

## 2016-12-04 DIAGNOSIS — R799 Abnormal finding of blood chemistry, unspecified: Secondary | ICD-10-CM | POA: Diagnosis not present

## 2016-12-04 DIAGNOSIS — I351 Nonrheumatic aortic (valve) insufficiency: Secondary | ICD-10-CM | POA: Diagnosis not present

## 2016-12-04 DIAGNOSIS — C253 Malignant neoplasm of pancreatic duct: Secondary | ICD-10-CM | POA: Diagnosis not present

## 2016-12-04 DIAGNOSIS — G894 Chronic pain syndrome: Secondary | ICD-10-CM | POA: Diagnosis not present

## 2016-12-04 DIAGNOSIS — K5900 Constipation, unspecified: Secondary | ICD-10-CM | POA: Diagnosis not present

## 2016-12-04 DIAGNOSIS — R109 Unspecified abdominal pain: Secondary | ICD-10-CM | POA: Diagnosis not present

## 2016-12-04 DIAGNOSIS — M069 Rheumatoid arthritis, unspecified: Secondary | ICD-10-CM | POA: Diagnosis present

## 2016-12-04 DIAGNOSIS — E639 Nutritional deficiency, unspecified: Secondary | ICD-10-CM | POA: Diagnosis not present

## 2016-12-04 DIAGNOSIS — M0569 Rheumatoid arthritis of multiple sites with involvement of other organs and systems: Secondary | ICD-10-CM | POA: Diagnosis not present

## 2016-12-04 DIAGNOSIS — R52 Pain, unspecified: Secondary | ICD-10-CM | POA: Diagnosis not present

## 2016-12-04 DIAGNOSIS — E871 Hypo-osmolality and hyponatremia: Secondary | ICD-10-CM | POA: Diagnosis present

## 2016-12-04 DIAGNOSIS — I340 Nonrheumatic mitral (valve) insufficiency: Secondary | ICD-10-CM | POA: Diagnosis not present

## 2016-12-04 DIAGNOSIS — T80211A Bloodstream infection due to central venous catheter, initial encounter: Secondary | ICD-10-CM | POA: Diagnosis present

## 2016-12-04 DIAGNOSIS — Z888 Allergy status to other drugs, medicaments and biological substances status: Secondary | ICD-10-CM | POA: Diagnosis not present

## 2016-12-04 DIAGNOSIS — Z6820 Body mass index (BMI) 20.0-20.9, adult: Secondary | ICD-10-CM | POA: Diagnosis not present

## 2016-12-04 DIAGNOSIS — I10 Essential (primary) hypertension: Secondary | ICD-10-CM | POA: Diagnosis not present

## 2016-12-04 DIAGNOSIS — C250 Malignant neoplasm of head of pancreas: Secondary | ICD-10-CM | POA: Diagnosis not present

## 2016-12-04 DIAGNOSIS — A419 Sepsis, unspecified organism: Secondary | ICD-10-CM | POA: Diagnosis not present

## 2016-12-04 DIAGNOSIS — I330 Acute and subacute infective endocarditis: Secondary | ICD-10-CM | POA: Diagnosis not present

## 2016-12-04 DIAGNOSIS — Z794 Long term (current) use of insulin: Secondary | ICD-10-CM | POA: Diagnosis not present

## 2016-12-04 DIAGNOSIS — I019 Acute rheumatic heart disease, unspecified: Secondary | ICD-10-CM | POA: Diagnosis not present

## 2016-12-04 DIAGNOSIS — R252 Cramp and spasm: Secondary | ICD-10-CM | POA: Diagnosis present

## 2016-12-04 DIAGNOSIS — R001 Bradycardia, unspecified: Secondary | ICD-10-CM | POA: Diagnosis not present

## 2016-12-04 DIAGNOSIS — D649 Anemia, unspecified: Secondary | ICD-10-CM | POA: Diagnosis present

## 2016-12-04 DIAGNOSIS — M6281 Muscle weakness (generalized): Secondary | ICD-10-CM | POA: Diagnosis not present

## 2016-12-04 DIAGNOSIS — K219 Gastro-esophageal reflux disease without esophagitis: Secondary | ICD-10-CM | POA: Diagnosis present

## 2016-12-04 DIAGNOSIS — E119 Type 2 diabetes mellitus without complications: Secondary | ICD-10-CM | POA: Diagnosis present

## 2016-12-04 DIAGNOSIS — E118 Type 2 diabetes mellitus with unspecified complications: Secondary | ICD-10-CM | POA: Diagnosis not present

## 2016-12-04 DIAGNOSIS — E861 Hypovolemia: Secondary | ICD-10-CM | POA: Diagnosis present

## 2016-12-04 DIAGNOSIS — M545 Low back pain: Secondary | ICD-10-CM | POA: Diagnosis not present

## 2016-12-04 DIAGNOSIS — M549 Dorsalgia, unspecified: Secondary | ICD-10-CM | POA: Diagnosis present

## 2016-12-04 DIAGNOSIS — Z9049 Acquired absence of other specified parts of digestive tract: Secondary | ICD-10-CM | POA: Diagnosis not present

## 2016-12-04 DIAGNOSIS — R1084 Generalized abdominal pain: Secondary | ICD-10-CM | POA: Diagnosis not present

## 2016-12-04 DIAGNOSIS — I5032 Chronic diastolic (congestive) heart failure: Secondary | ICD-10-CM | POA: Diagnosis present

## 2016-12-04 DIAGNOSIS — I110 Hypertensive heart disease with heart failure: Secondary | ICD-10-CM | POA: Diagnosis present

## 2016-12-04 DIAGNOSIS — B252 Cytomegaloviral pancreatitis: Secondary | ICD-10-CM | POA: Diagnosis not present

## 2016-12-04 DIAGNOSIS — B957 Other staphylococcus as the cause of diseases classified elsewhere: Secondary | ICD-10-CM | POA: Diagnosis present

## 2016-12-04 LAB — COMPREHENSIVE METABOLIC PANEL
ALBUMIN: 2.3 g/dL — AB (ref 3.5–5.0)
ALT: 13 U/L — ABNORMAL LOW (ref 14–54)
ANION GAP: 6 (ref 5–15)
AST: 19 U/L (ref 15–41)
Alkaline Phosphatase: 85 U/L (ref 38–126)
BUN: 17 mg/dL (ref 6–20)
CHLORIDE: 104 mmol/L (ref 101–111)
CO2: 24 mmol/L (ref 22–32)
Calcium: 7.8 mg/dL — ABNORMAL LOW (ref 8.9–10.3)
Creatinine, Ser: 0.55 mg/dL (ref 0.44–1.00)
GFR calc Af Amer: >60 mL/min (ref >60)
GFR calc non Af Amer: >60 mL/min (ref >60)
Glucose, Bld: 145 mg/dL — ABNORMAL HIGH (ref 65–99)
POTASSIUM: 4.1 mmol/L (ref 3.5–5.1)
SODIUM: 134 mmol/L — AB (ref 135–145)
Total Bilirubin: 0.9 mg/dL (ref 0.3–1.2)
Total Protein: 5.6 g/dL — ABNORMAL LOW (ref 6.5–8.1)

## 2016-12-04 LAB — GLUCOSE, CAPILLARY
Glucose-Capillary: 147 mg/dL — ABNORMAL HIGH (ref 65–99)
Glucose-Capillary: 220 mg/dL — ABNORMAL HIGH (ref 65–99)
Glucose-Capillary: 130 mg/dL — ABNORMAL HIGH (ref 65–99)

## 2016-12-04 LAB — MAGNESIUM: Magnesium: 1.7 mg/dL (ref 1.7–2.4)

## 2016-12-04 LAB — PHOSPHORUS: Phosphorus: 3.4 mg/dL (ref 2.5–4.6)

## 2016-12-04 MED ORDER — INSULIN ASPART 100 UNIT/ML ~~LOC~~ SOLN
0.0000 [IU] | SUBCUTANEOUS | Status: DC
  Administered 2016-12-04: 2 [IU] via SUBCUTANEOUS

## 2016-12-04 MED ORDER — FAT EMULSION 20 % IV EMUL
240.0000 mL | INTRAVENOUS | Status: DC
Start: 2016-12-04 — End: 2016-12-04
  Filled 2016-12-04: qty 250

## 2016-12-04 MED ORDER — INSULIN ASPART 100 UNIT/ML ~~LOC~~ SOLN: 0.0000 [IU] | Freq: Three times a day (TID) | SUBCUTANEOUS | 11 refills | Status: DC

## 2016-12-04 MED ORDER — HEPARIN SOD (PORK) LOCK FLUSH 100 UNIT/ML IV SOLN
250.0000 [IU] | INTRAVENOUS | Status: AC | PRN
  Administered 2016-12-04: 250 [IU]

## 2016-12-04 MED ORDER — ACETAMINOPHEN 325 MG PO TABS: 650.0000 mg | ORAL_TABLET | ORAL | Status: DC | PRN

## 2016-12-04 MED ORDER — M.V.I. ADULT IV INJ
INJECTION | INTRAVENOUS | Status: DC
  Filled 2016-12-04: qty 1780

## 2016-12-04 MED ORDER — ACETAMINOPHEN 325 MG PO TABS: 650.0000 mg | ORAL_TABLET | ORAL | 2 refills | Status: AC | PRN

## 2016-12-04 NOTE — Progress Notes (Signed)
[place] NOTE   Pharmacy Consult for TPN Indication: Intolerance to enteral feeding  Patient Measurements: Height: 5\' 5"  (165.1 cm) Weight: 133 lb 6.4 oz (60.5 kg) IBW/kg (Calculated) : 57 TPN AdjBW (KG): 65.5 Body mass index is 22.2 kg/m.   Assessment:  76yo female with Pancreatic cancer with pre-op evaluation of moderate Protein Calorie Malnutrition on 10/28/16.  Pt was admitted on 11/13/16 is now pod#16 s/p Whipple procedure and placement of J-tube.  Pt has been on Vital-AF but has not been able to tolerate advancement beyond 30 ml/hr (goal 60 ml/hr) and is not meeting nutritional goals per calorie count.  She is currently on a soft diet, but only able to tolerate 2-3 bites of food.  She is to start TPN with hopes of being able to advance po intake over the week.  8/29- started transition to cyclic TPN  GI:  S/P whipple procedure 11/13/16.  TF stopped 8/26 AM, feeling better without tube feeding & able to swallow more. Able to eat 3-6 bites of meals; however, is not hungry. (-) N/V, abdominal distention.  I spoke with pt this AM, she is bloated this AM and could not eat breakfast.   BM x 2.  Creon q4 hours, PPI-po, soft diet. Endo:  Moderate SSI.  CBGs on tpn:  190, 147, 145, 220. Insulin requirements in the past 24 hours: 13 units SSI since cyclic (should have rec'd 10 units) + 15 units in TPN Lytes: Na 134- stable, K 4.1, Phos 3.4, Mg 1.7.  Renal:  Cr 0.55, UOP x 2 Pulm:  RA Cards: Ao insufficiency, HTN, hx Rheumatic heart dz.  BP high, hr wnl -has prn agents for HTN Hepatobil:  LFTs wnl.  Palb is low indicating nutritional deficit, and TG wnl Neuro: low dose Benadryl for anxiety; pain controlled w/ prn morphine and oxycodone ID:  Best Practices:  Mouth care, SCDs TPN Access:  PICC  TPN start date:  8/25 >>  Nutritional Goals (per RD recommendation on 12/03/16): KCal:  1600-1800 kcal Protein: 80-90 g  Current Nutrition:  Cyclic  Clinimix-E 0/25 over 18hr + 20% Lipid emulsion Soft diet  Plan:  RD recommendations adjusted and will therefore adjust TPN  Change Clinimix-E 5/15 to cycle over 16hr: 76ml/hr x 1, then increase to 120 ml/hr x 14hr, then decrease to 78ml/hr x 1hr and d/c.  Total volume of TPN 1758mL Add MVI and Trace elements to TPN Increase insulin in TPN to 20 units Continue 20% Lipid emulsion at 73ml/hr over 12hr TPN will provide 1743 kcal and 89 g protein, providing 100% of caloric and protein goals TPN labs q Mon/Thurs Change mod SSI be checked at 1hr after tpn started, q4 while on tpn, and 1hr after stopped  Lewie Chamber., PharmD Clinical Pharmacist [facility] [phone] before 3p, then call [phone]

## 2016-12-04 NOTE — Progress Notes (Signed)
21 Days Post-Op  Subjective: Pt continues to feel well on some PO (eating 3-6 bites per meal) and TNA.    Objective: Vital signs in last 24 hours: Temp:  [98.5 F (36.9 C)-99.6 F (37.6 C)] 98.8 F (37.1 C) (08/30 0545) Pulse Rate:  [80-83] 83 (08/30 0545) Resp:  [14-16] 16 (08/30 0545) BP: (154-159)/(62-76) 154/62 (08/30 0545) SpO2:  [98 %-100 %] 98 % (08/30 0545) Weight:  [60.5 kg (133 lb 6.4 oz)] 60.5 kg (133 lb 6.4 oz) (08/30 0440) Last BM Date: 12/02/16  Intake/Output from previous day: 08/29 0701 - 08/30 0700 In: 2283.7 [P.O.:1070; I.V.:1213.7] Out: 700 [Urine:700] Intake/Output this shift: No intake/output data recorded.  General appearance: alert.  Very pleasant.  No distress.  Deconditioned. Resp: breathing comfortably GI: soft.  Nondistended.  Nontender.  Wound clean.  Lab Results:  Results for orders placed or performed during the hospital encounter of 11/13/16 (from the past 24 hour(s))  Glucose, capillary     Status: Abnormal   Collection Time: 12/03/16 12:32 PM  Result Value Ref Range   Glucose-Capillary 222 (H) 65 - 99 mg/dL   Comment 1 Notify RN   Glucose, capillary     Status: None   Collection Time: 12/03/16  4:42 PM  Result Value Ref Range   Glucose-Capillary 98 65 - 99 mg/dL   Comment 1 Notify RN   Glucose, capillary     Status: Abnormal   Collection Time: 12/03/16  8:04 PM  Result Value Ref Range   Glucose-Capillary 190 (H) 65 - 99 mg/dL  Glucose, capillary     Status: Abnormal   Collection Time: 12/03/16 11:30 PM  Result Value Ref Range   Glucose-Capillary 224 (H) 65 - 99 mg/dL  Glucose, capillary     Status: Abnormal   Collection Time: 12/04/16  3:49 AM  Result Value Ref Range   Glucose-Capillary 147 (H) 65 - 99 mg/dL  Comprehensive metabolic panel     Status: Abnormal   Collection Time: 12/04/16  4:22 AM  Result Value Ref Range   Sodium 134 (L) 135 - 145 mmol/L   Potassium 4.1 3.5 - 5.1 mmol/L   Chloride 104 101 - 111 mmol/L   CO2 24  22 - 32 mmol/L   Glucose, Bld 145 (H) 65 - 99 mg/dL   BUN 17 6 - 20 mg/dL   Creatinine, Ser 0.55 0.44 - 1.00 mg/dL   Calcium 7.8 (L) 8.9 - 10.3 mg/dL   Total Protein 5.6 (L) 6.5 - 8.1 g/dL   Albumin 2.3 (L) 3.5 - 5.0 g/dL   AST 19 15 - 41 U/L   ALT 13 (L) 14 - 54 U/L   Alkaline Phosphatase 85 38 - 126 U/L   Total Bilirubin 0.9 0.3 - 1.2 mg/dL   GFR calc non Af Amer >60 >60 mL/min   GFR calc Af Amer >60 >60 mL/min   Anion gap 6 5 - 15  Magnesium     Status: None   Collection Time: 12/04/16  4:22 AM  Result Value Ref Range   Magnesium 1.7 1.7 - 2.4 mg/dL  Phosphorus     Status: None   Collection Time: 12/04/16  4:22 AM  Result Value Ref Range   Phosphorus 3.4 2.5 - 4.6 mg/dL  Glucose, capillary     Status: Abnormal   Collection Time: 12/04/16  7:59 AM  Result Value Ref Range   Glucose-Capillary 220 (H) 65 - 99 mg/dL   Comment 1 Notify RN  Studies/Results: No results found.  . insulin aspart  0-15 Units Subcutaneous Q4H  . lipase/protease/amylase  36,000 Units Oral Q4H  . mouth rinse  15 mL Mouth Rinse BID  . pantoprazole  40 mg Oral BID     Assessment/Plan: s/p Procedure(s):  DAGNOSTIC LAPAROSCOPY WHIPPLE PROCEDURE PLACEMENT JEJUNOSTOMY TUBE  POD #21.  Whipple procedure. Intolerance to tube feedings despite multiple attempts and slow increase.    Inadequate oral intake Protein calorie malnutrition Otherwise stable without additional intra-abdominal complication to SNF today with TNA  SSI SQ instead of insulin in TNA.  LOS: 21 days    Rhonda Spencer 12/04/2016

## 2016-12-04 NOTE — Progress Notes (Signed)
Patient discharged to [facility] via [place], reported to nurse Clovis Pu.

## 2016-12-04 NOTE — Clinical Social Work Note (Signed)
/  Clinical Social Worker facilitated patient discharge including contacting patient family and facility to confirm patient discharge plans.  Clinical information faxed to facility and family agreeable with plan.  CSW arranged ambulance transport via [facility] to [facility].  RN to call [phone] for report prior to discharge.Patient will go to room 122A.  Clinical Social Worker will sign off for now as social work intervention is no longer needed. Please consult [place] again if new need arises.  [place], [place]

## 2016-12-09 ENCOUNTER — Telehealth: Payer: Self-pay | Admitting: Hematology

## 2016-12-09 NOTE — Telephone Encounter (Signed)
Unable to reach pt to see about moving appt from 9/5 to 9/4 per sch msg

## 2016-12-10 ENCOUNTER — Other Ambulatory Visit: Payer: Medicare Other

## 2016-12-10 ENCOUNTER — Ambulatory Visit: Payer: Medicare Other | Admitting: Hematology

## 2016-12-11 ENCOUNTER — Other Ambulatory Visit (HOSPITAL_COMMUNITY): Payer: Medicare Other

## 2016-12-12 ENCOUNTER — Telehealth: Payer: Self-pay | Admitting: Hematology

## 2016-12-12 DIAGNOSIS — E119 Type 2 diabetes mellitus without complications: Secondary | ICD-10-CM | POA: Diagnosis not present

## 2016-12-12 DIAGNOSIS — I10 Essential (primary) hypertension: Secondary | ICD-10-CM | POA: Diagnosis not present

## 2016-12-12 DIAGNOSIS — C253 Malignant neoplasm of pancreatic duct: Secondary | ICD-10-CM | POA: Diagnosis not present

## 2016-12-12 NOTE — Telephone Encounter (Signed)
Spoke with patient's son regarding rescheduling her f/u appt with Dr.Feng. However, they do not know when she is going to get out of rehab so he said that he would call back and get it rescheduled.

## 2016-12-16 DIAGNOSIS — E639 Nutritional deficiency, unspecified: Secondary | ICD-10-CM | POA: Diagnosis not present

## 2016-12-16 DIAGNOSIS — R799 Abnormal finding of blood chemistry, unspecified: Secondary | ICD-10-CM | POA: Diagnosis not present

## 2016-12-16 DIAGNOSIS — C253 Malignant neoplasm of pancreatic duct: Secondary | ICD-10-CM | POA: Diagnosis not present

## 2016-12-17 ENCOUNTER — Emergency Department (HOSPITAL_COMMUNITY): Payer: Medicare Other

## 2016-12-17 ENCOUNTER — Inpatient Hospital Stay (HOSPITAL_COMMUNITY)
Admission: EM | Admit: 2016-12-17 | Discharge: 2016-12-25 | DRG: 871 | Disposition: A | Discharge status: Home Health | Payer: Medicare Other | Attending: Family Medicine | Admitting: Family Medicine

## 2016-12-17 DIAGNOSIS — I5032 Chronic diastolic (congestive) heart failure: Secondary | ICD-10-CM | POA: Diagnosis present

## 2016-12-17 DIAGNOSIS — C250 Malignant neoplasm of head of pancreas: Secondary | ICD-10-CM | POA: Diagnosis present

## 2016-12-17 DIAGNOSIS — E43 Unspecified severe protein-calorie malnutrition: Secondary | ICD-10-CM | POA: Insufficient documentation

## 2016-12-17 DIAGNOSIS — B957 Other staphylococcus as the cause of diseases classified elsewhere: Secondary | ICD-10-CM | POA: Diagnosis present

## 2016-12-17 DIAGNOSIS — J189 Pneumonia, unspecified organism: Secondary | ICD-10-CM | POA: Diagnosis not present

## 2016-12-17 DIAGNOSIS — E871 Hypo-osmolality and hyponatremia: Secondary | ICD-10-CM | POA: Diagnosis not present

## 2016-12-17 DIAGNOSIS — Z91041 Radiographic dye allergy status: Secondary | ICD-10-CM

## 2016-12-17 DIAGNOSIS — Z23 Encounter for immunization: Secondary | ICD-10-CM

## 2016-12-17 DIAGNOSIS — A419 Sepsis, unspecified organism: Secondary | ICD-10-CM | POA: Diagnosis present

## 2016-12-17 DIAGNOSIS — I083 Combined rheumatic disorders of mitral, aortic and tricuspid valves: Secondary | ICD-10-CM | POA: Diagnosis present

## 2016-12-17 DIAGNOSIS — N390 Urinary tract infection, site not specified: Secondary | ICD-10-CM | POA: Diagnosis present

## 2016-12-17 DIAGNOSIS — T80211A Bloodstream infection due to central venous catheter, initial encounter: Secondary | ICD-10-CM | POA: Diagnosis present

## 2016-12-17 DIAGNOSIS — K219 Gastro-esophageal reflux disease without esophagitis: Secondary | ICD-10-CM | POA: Diagnosis present

## 2016-12-17 DIAGNOSIS — D638 Anemia in other chronic diseases classified elsewhere: Secondary | ICD-10-CM | POA: Diagnosis present

## 2016-12-17 DIAGNOSIS — R252 Cramp and spasm: Secondary | ICD-10-CM | POA: Diagnosis present

## 2016-12-17 DIAGNOSIS — D649 Anemia, unspecified: Secondary | ICD-10-CM | POA: Diagnosis present

## 2016-12-17 DIAGNOSIS — I071 Rheumatic tricuspid insufficiency: Secondary | ICD-10-CM

## 2016-12-17 DIAGNOSIS — I110 Hypertensive heart disease with heart failure: Secondary | ICD-10-CM | POA: Diagnosis present

## 2016-12-17 DIAGNOSIS — E118 Type 2 diabetes mellitus with unspecified complications: Secondary | ICD-10-CM

## 2016-12-17 DIAGNOSIS — Z87891 Personal history of nicotine dependence: Secondary | ICD-10-CM

## 2016-12-17 DIAGNOSIS — Y95 Nosocomial condition: Secondary | ICD-10-CM | POA: Diagnosis present

## 2016-12-17 DIAGNOSIS — K5900 Constipation, unspecified: Secondary | ICD-10-CM | POA: Diagnosis present

## 2016-12-17 DIAGNOSIS — Z888 Allergy status to other drugs, medicaments and biological substances status: Secondary | ICD-10-CM

## 2016-12-17 DIAGNOSIS — E861 Hypovolemia: Secondary | ICD-10-CM | POA: Diagnosis present

## 2016-12-17 DIAGNOSIS — I10 Essential (primary) hypertension: Secondary | ICD-10-CM | POA: Diagnosis present

## 2016-12-17 DIAGNOSIS — Z452 Encounter for adjustment and management of vascular access device: Secondary | ICD-10-CM | POA: Diagnosis not present

## 2016-12-17 DIAGNOSIS — E119 Type 2 diabetes mellitus without complications: Secondary | ICD-10-CM

## 2016-12-17 DIAGNOSIS — Z6820 Body mass index (BMI) 20.0-20.9, adult: Secondary | ICD-10-CM

## 2016-12-17 DIAGNOSIS — M069 Rheumatoid arthritis, unspecified: Secondary | ICD-10-CM | POA: Diagnosis present

## 2016-12-17 DIAGNOSIS — Z794 Long term (current) use of insulin: Secondary | ICD-10-CM

## 2016-12-17 DIAGNOSIS — Z9049 Acquired absence of other specified parts of digestive tract: Secondary | ICD-10-CM

## 2016-12-17 DIAGNOSIS — M549 Dorsalgia, unspecified: Secondary | ICD-10-CM | POA: Diagnosis present

## 2016-12-17 LAB — COMPREHENSIVE METABOLIC PANEL
ALBUMIN: 2.7 g/dL — AB (ref 3.5–5.0)
ALK PHOS: 101 U/L (ref 38–126)
ALT: 19 U/L (ref 14–54)
AST: 27 U/L (ref 15–41)
Anion gap: 8 (ref 5–15)
BUN: 20 mg/dL (ref 6–20)
CALCIUM: 7.8 mg/dL — AB (ref 8.9–10.3)
CHLORIDE: 100 mmol/L — AB (ref 101–111)
CO2: 19 mmol/L — AB (ref 22–32)
CREATININE: 0.67 mg/dL (ref 0.44–1.00)
GFR calc Af Amer: >60 mL/min (ref >60)
GFR calc non Af Amer: >60 mL/min (ref >60)
GLUCOSE: 149 mg/dL — AB (ref 65–99)
Potassium: 4.9 mmol/L (ref 3.5–5.1)
SODIUM: 127 mmol/L — AB (ref 135–145)
Total Bilirubin: 1.4 mg/dL — ABNORMAL HIGH (ref 0.3–1.2)
Total Protein: 6.5 g/dL (ref 6.5–8.1)

## 2016-12-17 LAB — CBC WITH DIFFERENTIAL/PLATELET
Basophils Absolute: 0 10*3/uL (ref 0.0–0.1)
Basophils Relative: 0 %
EOS ABS: 0.1 10*3/uL (ref 0.0–0.7)
EOS PCT: 1 %
HCT: 28.6 % — ABNORMAL LOW (ref 36.0–46.0)
HEMOGLOBIN: 9.4 g/dL — AB (ref 12.0–15.0)
LYMPHS ABS: 1.1 10*3/uL (ref 0.7–4.0)
LYMPHS PCT: 13 %
MCH: 28.7 pg (ref 26.0–34.0)
MCHC: 32.9 g/dL (ref 30.0–36.0)
MCV: 87.5 fL (ref 78.0–100.0)
MONOS PCT: 12 %
Monocytes Absolute: 1 10*3/uL (ref 0.1–1.0)
Neutro Abs: 6.1 10*3/uL (ref 1.7–7.7)
Neutrophils Relative %: 74 %
Platelets: 283 10*3/uL (ref 150–400)
RBC: 3.27 MIL/uL — ABNORMAL LOW (ref 3.87–5.11)
RDW: 13.5 % (ref 11.5–15.5)
WBC: 8.3 10*3/uL (ref 4.0–10.5)

## 2016-12-17 LAB — PROTIME-INR
INR: 1.16
PROTHROMBIN TIME: 14.7 s (ref 11.4–15.2)

## 2016-12-17 LAB — I-STAT CG4 LACTIC ACID, ED: Lactic Acid, Venous: 1.41 mmol/L (ref 0.5–1.9)

## 2016-12-17 MED ORDER — VANCOMYCIN HCL IN DEXTROSE 1-5 GM/200ML-% IV SOLN
1000.0000 mg | INTRAVENOUS | Status: DC
  Administered 2016-12-18 – 2016-12-19 (×2): 1000 mg via INTRAVENOUS
  Filled 2016-12-17 (×2): qty 200

## 2016-12-17 MED ORDER — VANCOMYCIN HCL IN DEXTROSE 1-5 GM/200ML-% IV SOLN
1000.0000 mg | Freq: Once | INTRAVENOUS | Status: AC
  Administered 2016-12-18: 1000 mg via INTRAVENOUS
  Filled 2016-12-17: qty 200

## 2016-12-17 MED ORDER — PIPERACILLIN-TAZOBACTAM 3.375 G IVPB
3.3750 g | Freq: Three times a day (TID) | INTRAVENOUS | Status: DC
  Administered 2016-12-18 – 2016-12-20 (×7): 3.375 g via INTRAVENOUS
  Filled 2016-12-17 (×8): qty 50

## 2016-12-17 MED ORDER — SODIUM CHLORIDE 0.9 % IV BOLUS (SEPSIS)
500.0000 mL | Freq: Once | INTRAVENOUS | Status: AC
  Administered 2016-12-18: 500 mL via INTRAVENOUS

## 2016-12-17 MED ORDER — PIPERACILLIN-TAZOBACTAM 3.375 G IVPB 30 MIN
3.3750 g | Freq: Once | INTRAVENOUS | Status: AC
Start: 2016-12-17 — End: 2016-12-18
  Administered 2016-12-18: 3.375 g via INTRAVENOUS
  Filled 2016-12-17: qty 50

## 2016-12-17 NOTE — Progress Notes (Addendum)
Pharmacy Antibiotic Note  Rhonda Spencer is a 76 y.o. female admitted on 12/17/2016 with fever and back pain.  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  Pharmacy has been consulted for vancomycin and Zosyn dosing for sepsis.  First doses of antibiotics are already ordered.  Her renal function is stable.  Patient is afebrile, WBC WNL, and lactic acid is unimpressive.   Plan: Vanc 1gm IV Q24H  Zosyn 3.375gm IV Q8H, 4 hr infusion Monitor renal fxn, clinical progress, vanc trough at C[facility] F/U with resuming TPN when appropriate   Height: 5\' 5"  (165.1 cm) Weight: 122 lb (55.3 kg) IBW/kg (Calculated) : 57  Temp (24hrs), Avg:99.1 F (37.3 C), Min:99.1 F (37.3 C), Max:99.1 F (37.3 C)   Recent Labs Lab 12/17/16 2204 12/17/16 2232  WBC 8.3  --   CREATININE 0.67  --   LATICACIDVEN  --  1.41    Estimated Creatinine Clearance: 50.6 mL/min (by C-G formula based on SCr of 0.67 mg/dL).    Allergies  Allergen Reactions  . Tarka [Trandolapril-Verapamil Hcl Er] Swelling    Slowed heart rate Pt reports allergy to any cardiac medication ending in "-il"  . Atorvastatin Other (See Comments)    states it lowered her heart rate  . Contrast Media [Iodinated Diagnostic Agents] Itching      Vanc 9/12 >> Zosyn 9/12 >>   Shamiya Demeritt D. Mina Marble, PharmD, BCPS Pager:  [phone] 12/17/2016, 11:04 PM   ============================================   Addendum: Received consult to resume TPN.  Will start at 1800 tonight.   Order standard TPN labs    Darrill Vreeland D. Mina Marble, PharmD, BCPS Pager:  [phone] 12/18/2016, 1:35 AM

## 2016-12-17 NOTE — ED Provider Notes (Signed)
[facility] DEPT Provider Note   CSN: [id number] Arrival date & time: 12/17/16  2142     History   Chief Complaint Chief Complaint  Patient presents with  . Fever  . Back Pain    HPI Rhonda Spencer is a 76 y.o. female.  HPI   Rhonda Spencer is a 76yo female with a history of pancreatic cancer (whipple procedure 11/2016), rheumatic heart disease, mitral regurgitation, aortic insufficiency, HTN who presents to the Emergency department with upper back pain and fever. Of note, patient is currently staying at a SNF and receives TPN through a PICC line. She states that she has had a fever for several days (reported as 101.12F by staff today.) Rhonda Spencer that she woke up at 2AM this morning feeling as if her upper back was sore. Since that time pain has gotten worse, feels like a "spasm." The pain radiates to both of her scapulas during the spasm. It is a 10/10 in severity, worsened with movement. She had one episode of vomitus today while trying to swallow a large pill. She has shortness of breath when she has episodes of back pain. She denies abdominal pain, diarrhea, dysuria, chest pain, cough, numbness, visual disturbance.     Past Medical History:  Diagnosis Date  . AI (aortic insufficiency)   . Anemia   . History of nuclear stress test 11/2008   bruce myoview; normal pattern of perfusion in allregions, no significant wall motion abnormalities; no ECG changes, no significant ischemia demonstrated, low risk scan   . Hypertension   . MR (mitral regurgitation)   . Rheumatic fever   . Rheumatic heart disease   . Rheumatoid arthritis ([place])   . Sinus bradycardia     Patient Active Problem List   Diagnosis Date Noted  . 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 Pancreatic cancer ([place]) 10/14/2016  . Sinus bradycardia 09/23/2015  . Rheumatoid arthritis ([place]) 09/23/2015  . Tricuspid regurgitation 09/23/2015  . Near syncope 09/22/2015  . Orthostasis 09/22/2015  . Essential  hypertension 09/22/2015  . Chronic pain 09/22/2015  . GERD (gastroesophageal reflux disease) 04/19/2015  . Abdominal pain, chronic, right lower quadrant 10/12/2012  . AI (aortic insufficiency) 09/09/2012  . Mitral regurgitation 09/09/2012  . Rheumatic heart disease 09/09/2012  . HTN (hypertension) 09/09/2012  . Pre-operative cardiovascular examination 09/09/2012  . Abdominal mass, RLQ (right lower quadrant) 09/07/2012    Past Surgical History:  Procedure Laterality Date  . ABDOMINAL HYSTERECTOMY    . CHOLECYSTECTOMY    . COLON SURGERY    . ERCP N/A 10/03/2016   Procedure: ENDOSCOPIC RETROGRADE CHOLANGIOPANCREATOGRAPHY (ERCP);  Surgeon: Carol Ada, MD;  Location: [facility];  Service: Endoscopy;  Laterality: N/A;  . EXCISIONAL HEMORRHOIDECTOMY    . HEMI-MICRODISCECTOMY LUMBAR LAMINECTOMY LEVEL 4  1995  . JEJUNOSTOMY N/A 11/13/2016   Procedure: PLACEMENT JEJUNOSTOMY TUBE;  Surgeon: [name], MD;  Location: [facility];  Service: General;  Laterality: N/A;  . LAPAROSCOPY N/A 11/13/2016   Procedure: ATTEMPTED DAGNOSTIC LAPAROSCOPY;  Surgeon: [name], MD;  Location: [facility];  Service: General;  Laterality: N/A;  . TONSILLECTOMY    . TRANSTHORACIC ECHOCARDIOGRAM  11/2011   EF=>55%; LA mild-mod dilated; mod-severe MR; mild-mod TR with normal systolic pressure; mild-mod AV regurg; mild pulm valve regurg   . UPPER ESOPHAGEAL ENDOSCOPIC ULTRASOUND (EUS) N/A 09/26/2016   Procedure: UPPER ESOPHAGEAL ENDOSCOPIC ULTRASOUND (EUS);  Surgeon: Carol Ada, MD;  Location: [facility];  Service: Endoscopy;  Laterality: N/A;  . UPPER ESOPHAGEAL ENDOSCOPIC  ULTRASOUND (EUS) N/A 10/03/2016   Procedure: UPPER ESOPHAGEAL ENDOSCOPIC ULTRASOUND (EUS);  Surgeon: Carol Ada, MD;  Location: [facility];  Service: Endoscopy;  Laterality: N/A;  . WHIPPLE PROCEDURE N/A 11/13/2016   Procedure: WHIPPLE PROCEDURE;  Surgeon: [name], MD;  Location: [facility];  Service: General;  Laterality: N/A;    OB History      No data available       Home Medications    Prior to Admission medications   Medication Sig Start Date End Date Taking? Authorizing Provider  acetaminophen (TYLENOL) 325 MG tablet Take 2 tablets (650 mg total) by mouth every 4 (four) hours as needed for mild pain, moderate pain, fever or headache. 12/04/16   [name], MD  aspirin EC 81 MG tablet Take 81 mg by mouth daily.    [provider]  betamethasone dipropionate (DIPROLENE) 0.05 % cream Apply 1 application topically daily as needed (itching).    [provider]  bisacodyl (DULCOLAX) 10 MG suppository Place 1 suppository (10 mg total) rectally daily as needed for moderate constipation. 12/03/16   [name], MD  felodipine (PLENDIL) 5 MG 24 hr tablet Take 5 mg by mouth daily.    [provider]  insulin aspart (NOVOLOG) 100 UNIT/ML injection Inject 0-15 Units into the skin every 4 (four) hours. 12/03/16   [name], MD  insulin aspart (NOVOLOG) 100 UNIT/ML injection Inject 0-15 Units into the skin 3 (three) times daily with meals. 12/04/16   [name], MD  ketotifen (ZADITOR) 0.025 % ophthalmic solution Place 1 drop into the left eye daily as needed (irritation).    [provider]  lidocaine (LIDODERM) 5 % Place 1 patch onto the skin daily as needed (back pain). Remove & Discard patch within 12 hours or as directed by MD     [provider]  lipase/protease/amylase (CREON) 36000 UNITS CPEP capsule Take 1 capsule (36,000 Units total) by mouth 3 (three) times daily with meals. 12/02/16   [name], MD  Magnesium 250 MG TABS Take 250 mg by mouth daily as needed (for leg crampsl).     [provider]  pantoprazole (PROTONIX) 40 MG tablet Take 1 tablet (40 mg total) by mouth 2 (two) times daily. 12/02/16   [name], MD  prochlorperazine (COMPAZINE) 10 MG tablet Take 1 tablet (10 mg total) by mouth every 6 (six) hours as needed for nausea or vomiting (Use for nausea and  / or vomiting unresolved with ondansetron (Zofran).). 12/02/16   [name], MD    Family History Family History  Problem Relation Age of Onset  . Hypertension Mother   . Stroke Mother   . Heart disease Mother   . Pneumonia Father   . Bone cancer Maternal Uncle   . Diabetes Sister        x2; one was step-sister  . Tuberculosis Sister        step-sister  . Diabetes Son   . Diabetes Daughter     Social History Social History  Substance Use Topics  . Smoking status: Former Smoker    Years: 8.00    Quit date: 04/08/1967  . Smokeless tobacco: Never Used  . Alcohol use No     Allergies   Tarka [trandolapril-verapamil hcl er]; Atorvastatin; and Contrast media [iodinated diagnostic agents]   Review of Systems Review of Systems  Constitutional: Positive for appetite change, chills, fatigue and fever.  HENT: Negative for sore throat.   Eyes: Negative for visual disturbance.  Respiratory: Positive for shortness of breath. Negative for cough and wheezing.   Cardiovascular: Negative for chest pain and palpitations.  Gastrointestinal: Positive for vomiting. Negative for abdominal pain, constipation and diarrhea.  Genitourinary: Negative for difficulty urinating and dysuria.  Musculoskeletal: Positive for back pain.  Skin: Negative for wound.  Neurological: Negative for weakness, light-headedness, numbness and headaches.  Psychiatric/Behavioral: Negative for agitation.     Physical Exam Updated Vital Signs BP (!) 146/72 (BP Location: Left Arm)   Pulse (!) 112   Temp 99.1 F (37.3 C) (Oral)   Resp 20   Ht 5\' 5"  (1.651 m)   Wt 55.3 kg (122 lb)   SpO2 97%   BMI 20.30 kg/m   Physical Exam  Constitutional: She is oriented to person, place, and time. She appears well-developed and well-nourished.  HENT:  Head: Normocephalic and atraumatic.  Mouth/Throat: Oropharynx is clear and moist.  White plaques noted on tongue.   Eyes: Pupils are equal, round, and reactive to  light. Conjunctivae and EOM are normal. Right eye exhibits no discharge. Left eye exhibits no discharge. No scleral icterus.  Neck: Neck supple.  Full ROM of neck  Cardiovascular: Normal rate and regular rhythm.  Exam reveals no gallop and no friction rub.   Murmur (systolic) heard. Pulmonary/Chest: Effort normal and breath sounds normal. No respiratory distress. She has no wheezes. She has no rales. She exhibits no tenderness.  Abdominal: Soft. Bowel sounds are normal. She exhibits no distension and no mass. There is no tenderness. There is no guarding.  Midline surgical scar, well healed  Musculoskeletal: Normal range of motion.  Patient is grossely tender to palpation over bilateral scapulas, paraspinal muscles of the thoracic spine. No point tenderness over vertebrae.   Neurological: She is alert and oriented to person, place, and time. Coordination normal.  Mental Status:  Alert, oriented, thought content appropriate, able to give a coherent history. Speech fluent without evidence of aphasia. Able to follow 2 step commands without difficulty.  Cranial Nerves:  II:  Peripheral visual fields grossly normal, pupils equal, round, reactive to light III,IV, VI: ptosis not present, extra-ocular motions intact bilaterally  V,VII: smile symmetric, facial light touch sensation equal VIII: hearing grossly normal to voice  X: uvula elevates symmetrically  XI: bilateral shoulder shrug symmetric and strong XII: midline tongue extension without fassiculations Motor:  Normal tone. 5/5 grip strength bilaterally and 5/5 strength bilateral dorsiflexion/plantar flexion Sensory: Pinprick and light touch normal in all extremities.  Deep Tendon Reflexes: 2+ and symmetric in the biceps and patella Cerebellar: normal finger-to-nose with bilateral upper extremities  Skin: Skin is warm and dry. Capillary refill takes less than 2 seconds. No rash noted. No erythema.  Psychiatric: She has a normal mood and  affect. Her behavior is normal.  Nursing note and vitals reviewed.    ED Treatments / Results  Labs (all labs ordered are listed, but only abnormal results are displayed) Labs Reviewed  COMPREHENSIVE METABOLIC PANEL - Abnormal; Notable for the following:       Result Value   Sodium 127 (*)    Chloride 100 (*)    CO2 19 (*)    Glucose, Bld 149 (*)    Calcium 7.8 (*)    Albumin 2.7 (*)    Total Bilirubin 1.4 (*)    All other components within normal limits  CBC WITH DIFFERENTIAL/PLATELET - Abnormal; Notable for the following:    RBC 3.27 (*)    Hemoglobin 9.4 (*)    HCT  28.6 (*)    All other components within normal limits  CULTURE, BLOOD (ROUTINE X 2)  CULTURE, BLOOD (ROUTINE X 2)  URINE CULTURE  PROTIME-INR  URINALYSIS, ROUTINE W REFLEX MICROSCOPIC  I-STAT CG4 LACTIC ACID, ED    EKG  EKG Interpretation None       Radiology Dg Chest 2 View  Result Date: 12/17/2016 CLINICAL DATA:  76 y/o  F; PICC line verification.  Possible sepsis. EXAM: CHEST  2 VIEW COMPARISON:  10/21/2016 CT chest. FINDINGS: Stable normal cardiac silhouette. Right PICC line tip projects over the cavoatrial junction. Left lower lobe patchy consolidation. Right upper quadrant cholecystectomy clips. Bones are unremarkable. IMPRESSION: Left lower lobe patchy consolidation may represent pneumonia. Right PICC line tip projects over cavoatrial junction. Electronically Signed   By: Kristine Garbe M.D.   On: 12/17/2016 22:38    Procedures Procedures (including critical care time)  Medications Ordered in ED Medications  piperacillin-tazobactam (ZOSYN) IVPB 3.375 g (not administered)  vancomycin (VANCOCIN) IVPB 1000 mg/200 mL premix (not administered)  vancomycin (VANCOCIN) IVPB 1000 mg/200 mL premix (not administered)  piperacillin-tazobactam (ZOSYN) IVPB 3.375 g (not administered)  sodium chloride 0.9 % bolus 500 mL (not administered)     Initial Impression / Assessment and Plan / ED  Course  I have reviewed the triage vital signs and the nursing notes.  Pertinent labs & imaging results that were available during my care of the patient were reviewed by me and considered in my medical decision making (see chart for details).    Blood cultures ordered. Patient started on broad spectrum antibiotics given  fevers and that she is receiving TPN through a PICC line.  Patient is tachycardic with pulse of 112, she is also hyponatremic (Na 127.) Given 529mL bolus of NS.   Her CXR shows left lower lobe pneumonia. Her lactic acid is not elevated (1.41). She does not have a white count. Her hemoglobin is 9.4 which seems to be baseline for her. UA pending.  Dr. Laverta Baltimore spoke to hospitalist who will admit the patient for pneumonia. They are requesting CT angio which has been ordered.   Final Clinical Impressions(s) / ED Diagnoses   Final diagnoses:  HCAP (healthcare-associated pneumonia)    New Prescriptions New Prescriptions   No medications on file     Bernarda Caffey 12/18/16 0010    LongWonda Olds, MD 12/18/16 [id number]

## 2016-12-17 NOTE — ED Triage Notes (Addendum)
BIB [facility] from [facility] for fever and back pain starting at 0200 that has progressively worsened,  101.8 temp for staff; staff gave tylenol.  Back pain starts mid-back under shoulder blades and radiates to front.  Pt as PICC line in R arm s/p whipple for pancreatic cancer in August.  Pt receives TPN and PRN meds through it.   EMS placed 20G IV in L forearm, gave 500cc bolus and 100 mcg of fentanyl.

## 2016-12-17 NOTE — ED Notes (Signed)
Patient transported to X-ray 

## 2016-12-17 NOTE — H&P (Signed)
History and Physical    Rhonda Spencer GLO:[id number] DOB: Dec 24, 1940 DOA: 12/17/2016  Referring MD/NP/PA: Dr. Nanda Quinton PCP: Iona Beard, MD  Patient coming from: [facility] from [facility]   Chief Complaint: fever  HPI: Rhonda Spencer is a 76 y.o. female with medical history significant of  adenocarcinoma of the pancreas s/p pancreaticoduodenectomy, diastolic CHF, DM type 2; who presents with complains of fever for the last 2 days. Fever went up to 102F at the nursing facility. Patient reports c/o spasm like pain in her mid back between her shoulder blades. Associated symptoms included shortness of breathe, nausea, vomiting, abdominal soreness, and weight loss of 10 pounds since the surgery. Patient noted current medications prescribed provided not relief of symptoms. Denies and significant cough  Patient had been admitted into the [facility] from 8/9-8/30 following the pancreaticoduodenectomy with a short stay in the ICU. Patient had placement of a gastric tube as well as PICC line prior to being started on TPN. She was sent to a SNF upon discharge.  ED Course: On admission into the emergency department patient was seen to have a temperature of 99.72F, pulse 112, respirations 20, pressure 146/72, and O2 saturation maintained on room air. Labs revealed WBC 8.3, hemoglobin 9.4, sodium 127, CO2 19, BUN 20, creatinine 0.67, albumin 2.7  Review of Systems  Constitutional: Positive for fever, malaise/fatigue and weight loss.  HENT: Negative for ear discharge and nosebleeds.   Eyes: Negative for blurred vision and double vision.  Respiratory: Positive for shortness of breath. Negative for cough and sputum production.   Cardiovascular: Negative for chest pain.  Gastrointestinal: Positive for nausea and vomiting.  Genitourinary: Positive for frequency. Negative for dysuria.  Musculoskeletal: Positive for back pain.  Skin: Negative for itching and rash.  Neurological: Positive for  weakness. Negative for focal weakness.  Psychiatric/Behavioral: Negative for substance abuse.    Past Medical History:  Diagnosis Date  . AI (aortic insufficiency)   . Anemia   . History of nuclear stress test 11/2008   bruce myoview; normal pattern of perfusion in allregions, no significant wall motion abnormalities; no ECG changes, no significant ischemia demonstrated, low risk scan   . Hypertension   . MR (mitral regurgitation)   . Rheumatic fever   . Rheumatic heart disease   . Rheumatoid arthritis ([place])   . Sinus bradycardia     Past Surgical History:  Procedure Laterality Date  . ABDOMINAL HYSTERECTOMY    . CHOLECYSTECTOMY    . COLON SURGERY    . ERCP N/A 10/03/2016   Procedure: ENDOSCOPIC RETROGRADE CHOLANGIOPANCREATOGRAPHY (ERCP);  Surgeon: Carol Ada, MD;  Location: [facility];  Service: Endoscopy;  Laterality: N/A;  . EXCISIONAL HEMORRHOIDECTOMY    . HEMI-MICRODISCECTOMY LUMBAR LAMINECTOMY LEVEL 4  1995  . JEJUNOSTOMY N/A 11/13/2016   Procedure: PLACEMENT JEJUNOSTOMY TUBE;  Surgeon: [name], MD;  Location: [place];  Service: General;  Laterality: N/A;  . LAPAROSCOPY N/A 11/13/2016   Procedure: ATTEMPTED DAGNOSTIC LAPAROSCOPY;  Surgeon: [name], MD;  Location: [place];  Service: General;  Laterality: N/A;  . TONSILLECTOMY    . TRANSTHORACIC ECHOCARDIOGRAM  11/2011   EF=>55%; LA mild-mod dilated; mod-severe MR; mild-mod TR with normal systolic pressure; mild-mod AV regurg; mild pulm valve regurg   . UPPER ESOPHAGEAL ENDOSCOPIC ULTRASOUND (EUS) N/A 09/26/2016   Procedure: UPPER ESOPHAGEAL ENDOSCOPIC ULTRASOUND (EUS);  Surgeon: Carol Ada, MD;  Location: [facility];  Service: Endoscopy;  Laterality: N/A;  . UPPER ESOPHAGEAL ENDOSCOPIC ULTRASOUND (EUS)  N/A 10/03/2016   Procedure: UPPER ESOPHAGEAL ENDOSCOPIC ULTRASOUND (EUS);  Surgeon: Carol Ada, MD;  Location: [facility];  Service: Endoscopy;  Laterality: N/A;  . WHIPPLE PROCEDURE N/A 11/13/2016   Procedure:  WHIPPLE PROCEDURE;  Surgeon: [name], MD;  Location: [place];  Service: General;  Laterality: N/A;     reports that she quit smoking about 49 years ago. She quit after 8.00 years of use. She has never used smokeless tobacco. She reports that she does not drink alcohol or use drugs.  Allergies  Allergen Reactions  . Tarka [Trandolapril-Verapamil Hcl Er] Swelling    Slowed heart rate Pt reports allergy to any cardiac medication ending in "-il"  . Atorvastatin Other (See Comments)    states it lowered her heart rate  . Contrast Media [Iodinated Diagnostic Agents] Itching    Family History  Problem Relation Age of Onset  . Hypertension Mother   . Stroke Mother   . Heart disease Mother   . Pneumonia Father   . Bone cancer Maternal Uncle   . Diabetes Sister        x2; one was step-sister  . Tuberculosis Sister        step-sister  . Diabetes Son   . Diabetes Daughter     Prior to Admission medications   Medication Sig Start Date End Date Taking? Authorizing Provider  acetaminophen (TYLENOL) 325 MG tablet Take 2 tablets (650 mg total) by mouth every 4 (four) hours as needed for mild pain, moderate pain, fever or headache. 12/04/16   [name], MD  aspirin EC 81 MG tablet Take 81 mg by mouth daily.    [provider]  betamethasone dipropionate (DIPROLENE) 0.05 % cream Apply 1 application topically daily as needed (itching).    [provider]  bisacodyl (DULCOLAX) 10 MG suppository Place 1 suppository (10 mg total) rectally daily as needed for moderate constipation. 12/03/16   [name], MD  felodipine (PLENDIL) 5 MG 24 hr tablet Take 5 mg by mouth daily.    [provider]  insulin aspart (NOVOLOG) 100 UNIT/ML injection Inject 0-15 Units into the skin every 4 (four) hours. 12/03/16   [name], MD  insulin aspart (NOVOLOG) 100 UNIT/ML injection Inject 0-15 Units into the skin 3 (three) times daily with meals. 12/04/16   [name], MD    ketotifen (ZADITOR) 0.025 % ophthalmic solution Place 1 drop into the left eye daily as needed (irritation).    [provider]  lidocaine (LIDODERM) 5 % Place 1 patch onto the skin daily as needed (back pain). Remove & Discard patch within 12 hours or as directed by MD     [provider]  lipase/protease/amylase (CREON) 36000 UNITS CPEP capsule Take 1 capsule (36,000 Units total) by mouth 3 (three) times daily with meals. 12/02/16   [name], MD  Magnesium 250 MG TABS Take 250 mg by mouth daily as needed (for leg crampsl).     [provider]  pantoprazole (PROTONIX) 40 MG tablet Take 1 tablet (40 mg total) by mouth 2 (two) times daily. 12/02/16   [name], MD  prochlorperazine (COMPAZINE) 10 MG tablet Take 1 tablet (10 mg total) by mouth every 6 (six) hours as needed for nausea or vomiting (Use for nausea and / or vomiting unresolved with ondansetron (Zofran).). 12/02/16   [name], MD    Physical Exam:  Constitutional: Thin elderly female in moderate discomfort who appears ill. Vitals:   12/17/16 2200 12/17/16 2203 12/17/16  2205 12/17/16 2212  BP:   (!) 146/72   Pulse:   (!) 112   Resp:   20   Temp:   99.1 F (37.3 C)   TempSrc:   Oral   SpO2: 99% 99% 97%   Weight:    55.3 kg (122 lb)  Height:    5\' 5"  (1.651 m)   Eyes: PERRL, lids and conjunctivae normal ENMT: Mucous membranes are dry. Posterior pharynx clear of any exudate or lesions.  Neck: normal, supple, no masses, no thyromegaly Respiratory: clear to auscultation bilaterally, no wheezing, no crackles. Normal respiratory effort. No accessory muscle use.  Cardiovascular: Regular rate and rhythm, no murmurs / rubs / gallops. No extremity edema. 2+ pedal pulses. No carotid bruits. PICC line in Right arm.  Abdomen: no tenderness, no masses palpated. No hepatosplenomegaly. Bowel sounds positive. JG tube in place. Musculoskeletal: no clubbing / cyanosis. No joint deformity upper and lower  extremities. Good ROM, no contractures. Normal muscle tone.  Skin: no rashes, lesions, ulcers. No induration Neurologic: CN 2-12 grossly intact. Sensation intact, DTR normal. Strength 5/5 in all 4.  Psychiatric: Normal judgment and insight. Alert and oriented x 3. Normal mood.     Labs on Admission: I have personally reviewed following labs and imaging studies  CBC:  Recent Labs Lab 12/17/16 2204  WBC 8.3  NEUTROABS 6.1  HGB 9.4*  HCT 28.6*  MCV 87.5  PLT 245   Basic Metabolic Panel:  Recent Labs Lab 12/17/16 2204  NA 127*  K 4.9  CL 100*  CO2 19*  GLUCOSE 149*  BUN 20  CREATININE 0.67  CALCIUM 7.8*   GFR: Estimated Creatinine Clearance: 50.6 mL/min (by C-G formula based on SCr of 0.67 mg/dL). Liver Function Tests:  Recent Labs Lab 12/17/16 2204  AST 27  ALT 19  ALKPHOS 101  BILITOT 1.4*  PROT 6.5  ALBUMIN 2.7*   No results for input(s): LIPASE, AMYLASE in the last 168 hours. No results for input(s): AMMONIA in the last 168 hours. Coagulation Profile:  Recent Labs Lab 12/17/16 2204  INR 1.16   Cardiac Enzymes: No results for input(s): CKTOTAL, CKMB, CKMBINDEX, TROPONINI in the last 168 hours. BNP (last 3 results) No results for input(s): PROBNP in the last 8760 hours. HbA1C: No results for input(s): HGBA1C in the last 72 hours. CBG: No results for input(s): GLUCAP in the last 168 hours. Lipid Profile: No results for input(s): CHOL, HDL, LDLCALC, TRIG, CHOLHDL, LDLDIRECT in the last 72 hours. Thyroid Function Tests: No results for input(s): TSH, T4TOTAL, FREET4, T3FREE, THYROIDAB in the last 72 hours. Anemia Panel: No results for input(s): VITAMINB12, FOLATE, FERRITIN, TIBC, IRON, RETICCTPCT in the last 72 hours. Urine analysis:    Component Value Date/Time   COLORURINE YELLOW 11/13/2016 [place] 11/13/2016 0649   LABSPEC 1.015 11/13/2016 0649   PHURINE 5.0 11/13/2016 0649   GLUCOSEU 50 (A) 11/13/2016 0649   HGBUR  NEGATIVE 11/13/2016 [place] 11/13/2016 0649   [place] 11/13/2016 0649   PROTEINUR NEGATIVE 11/13/2016 0649   UROBILINOGEN 1.0 07/31/2013 0155   NITRITE NEGATIVE 11/13/2016 0649   LEUKOCYTESUR TRACE (A) 11/13/2016 0649   Sepsis Labs: No results found for this or any previous visit (from the past 240 hour(s)).   Radiological Exams on Admission: Dg Chest 2 View  Result Date: 12/17/2016 CLINICAL DATA:  76 y/o  F; PICC line verification.  Possible sepsis. EXAM: CHEST  2 VIEW COMPARISON:  10/21/2016 CT  chest. FINDINGS: Stable normal cardiac silhouette. Right PICC line tip projects over the cavoatrial junction. Left lower lobe patchy consolidation. Right upper quadrant cholecystectomy clips. Bones are unremarkable. IMPRESSION: Left lower lobe patchy consolidation may represent pneumonia. Right PICC line tip projects over cavoatrial junction. Electronically Signed   By: Kristine Garbe M.D.   On: 12/17/2016 22:38    EKG: Independently reviewed. Sinus rhythm with LVH  Assessment/Plan Sepsis 2/2 Healthcare associated pneumonia: Acute. Patient presents with Fever up to 102 F and tachycardia. Lactic acid is reassuring at 1.47. CXR shows a left lower lobe pneumonia. DDx included Pulmonary embolus given recent surgery vs blood infection as patient on TPN. - Admit to telemetry bed - Sepsis protocol initiated  - Added on Procalcitonin - Follow up blood and sputum cultures - Continue empiric antibiotics of vancomycin and Zoysn per pharmacy - Tylenol prn Fever - Duonebs prn SOB/Wheezing - Mucinex  Normocytic normochromic anemia: Stable. Patient hemoglobin 9.4 on admission which appears similar to prior lab work obtained on 8/27. - Continue to monitor   Diabetes mellitus type 2 - Hypoglycemic protocols  - Continue Levemir - CBGs with SSI  Essential hypertension - continue home medications  Abnormal UA -  Follow-up urine culture  Diastolic CHF: Last EF  31-49% with grade 1 diastolic dysfunction in 10/261. Patient currently appears hypovolemic. -  Monitor input/output and daily weights    Adenocarcinoma of the pancreas s/p pancreaticoduodenectomy - Continue pancreatic enzymes  - TPN per pharmacy  Back pain: Acute. Suspect related to possible pneumonia, but given recent surgery at increased risk for possible PE. - Robaxin prn back spasm - Follow-up CT angiogram to rule out possible pulmonary embolus  Hypoalbuminemia - Check prealbumin in a.m.  Gerd:  Continue Protonix  DVT prophylaxis: Lovenox Code Status: Full  Family Communication:   discussed plan of care with the patient and family present at bedside  Disposition Plan: Discharge back to SNF once medically stable  Consults called: None  Admission status: inpatient  Norval Morton MD [facility] Pager [phone]   If 7PM-7AM, please contact night-coverage [email or web address] Password [facility]  12/17/2016, 11:55 PM

## 2016-12-18 ENCOUNTER — Encounter (HOSPITAL_COMMUNITY): Payer: Self-pay

## 2016-12-18 ENCOUNTER — Inpatient Hospital Stay (HOSPITAL_COMMUNITY): Payer: Medicare Other

## 2016-12-18 DIAGNOSIS — E119 Type 2 diabetes mellitus without complications: Secondary | ICD-10-CM | POA: Diagnosis present

## 2016-12-18 DIAGNOSIS — B957 Other staphylococcus as the cause of diseases classified elsewhere: Secondary | ICD-10-CM | POA: Diagnosis present

## 2016-12-18 DIAGNOSIS — I340 Nonrheumatic mitral (valve) insufficiency: Secondary | ICD-10-CM | POA: Diagnosis not present

## 2016-12-18 DIAGNOSIS — E43 Unspecified severe protein-calorie malnutrition: Secondary | ICD-10-CM | POA: Diagnosis not present

## 2016-12-18 DIAGNOSIS — A419 Sepsis, unspecified organism: Secondary | ICD-10-CM | POA: Diagnosis not present

## 2016-12-18 DIAGNOSIS — Z6820 Body mass index (BMI) 20.0-20.9, adult: Secondary | ICD-10-CM | POA: Diagnosis not present

## 2016-12-18 DIAGNOSIS — I351 Nonrheumatic aortic (valve) insufficiency: Secondary | ICD-10-CM | POA: Diagnosis not present

## 2016-12-18 DIAGNOSIS — E861 Hypovolemia: Secondary | ICD-10-CM | POA: Diagnosis present

## 2016-12-18 DIAGNOSIS — M549 Dorsalgia, unspecified: Secondary | ICD-10-CM | POA: Diagnosis present

## 2016-12-18 DIAGNOSIS — I330 Acute and subacute infective endocarditis: Secondary | ICD-10-CM | POA: Diagnosis not present

## 2016-12-18 DIAGNOSIS — R079 Chest pain, unspecified: Secondary | ICD-10-CM | POA: Diagnosis not present

## 2016-12-18 DIAGNOSIS — Z888 Allergy status to other drugs, medicaments and biological substances status: Secondary | ICD-10-CM | POA: Diagnosis not present

## 2016-12-18 DIAGNOSIS — I10 Essential (primary) hypertension: Secondary | ICD-10-CM | POA: Diagnosis not present

## 2016-12-18 DIAGNOSIS — M48061 Spinal stenosis, lumbar region without neurogenic claudication: Secondary | ICD-10-CM | POA: Diagnosis not present

## 2016-12-18 DIAGNOSIS — J189 Pneumonia, unspecified organism: Secondary | ICD-10-CM | POA: Diagnosis present

## 2016-12-18 DIAGNOSIS — K5900 Constipation, unspecified: Secondary | ICD-10-CM | POA: Diagnosis not present

## 2016-12-18 DIAGNOSIS — C250 Malignant neoplasm of head of pancreas: Secondary | ICD-10-CM | POA: Diagnosis not present

## 2016-12-18 DIAGNOSIS — M4804 Spinal stenosis, thoracic region: Secondary | ICD-10-CM | POA: Diagnosis not present

## 2016-12-18 DIAGNOSIS — Y95 Nosocomial condition: Secondary | ICD-10-CM | POA: Diagnosis not present

## 2016-12-18 DIAGNOSIS — M069 Rheumatoid arthritis, unspecified: Secondary | ICD-10-CM | POA: Diagnosis present

## 2016-12-18 DIAGNOSIS — Z23 Encounter for immunization: Secondary | ICD-10-CM | POA: Diagnosis not present

## 2016-12-18 DIAGNOSIS — I5032 Chronic diastolic (congestive) heart failure: Secondary | ICD-10-CM | POA: Diagnosis present

## 2016-12-18 DIAGNOSIS — I083 Combined rheumatic disorders of mitral, aortic and tricuspid valves: Secondary | ICD-10-CM | POA: Diagnosis present

## 2016-12-18 DIAGNOSIS — Z9049 Acquired absence of other specified parts of digestive tract: Secondary | ICD-10-CM | POA: Diagnosis not present

## 2016-12-18 DIAGNOSIS — D649 Anemia, unspecified: Secondary | ICD-10-CM | POA: Diagnosis present

## 2016-12-18 DIAGNOSIS — Z87891 Personal history of nicotine dependence: Secondary | ICD-10-CM | POA: Diagnosis not present

## 2016-12-18 DIAGNOSIS — E871 Hypo-osmolality and hyponatremia: Secondary | ICD-10-CM | POA: Diagnosis present

## 2016-12-18 DIAGNOSIS — D638 Anemia in other chronic diseases classified elsewhere: Secondary | ICD-10-CM | POA: Diagnosis present

## 2016-12-18 DIAGNOSIS — I361 Nonrheumatic tricuspid (valve) insufficiency: Secondary | ICD-10-CM | POA: Diagnosis not present

## 2016-12-18 DIAGNOSIS — E118 Type 2 diabetes mellitus with unspecified complications: Secondary | ICD-10-CM | POA: Diagnosis not present

## 2016-12-18 DIAGNOSIS — R252 Cramp and spasm: Secondary | ICD-10-CM | POA: Diagnosis present

## 2016-12-18 DIAGNOSIS — Z794 Long term (current) use of insulin: Secondary | ICD-10-CM | POA: Diagnosis not present

## 2016-12-18 DIAGNOSIS — K219 Gastro-esophageal reflux disease without esophagitis: Secondary | ICD-10-CM | POA: Diagnosis present

## 2016-12-18 DIAGNOSIS — I110 Hypertensive heart disease with heart failure: Secondary | ICD-10-CM | POA: Diagnosis present

## 2016-12-18 DIAGNOSIS — T80211A Bloodstream infection due to central venous catheter, initial encounter: Secondary | ICD-10-CM | POA: Diagnosis present

## 2016-12-18 DIAGNOSIS — N390 Urinary tract infection, site not specified: Secondary | ICD-10-CM | POA: Diagnosis present

## 2016-12-18 LAB — URINALYSIS, ROUTINE W REFLEX MICROSCOPIC
Bilirubin Urine: NEGATIVE
GLUCOSE, UA: NEGATIVE mg/dL
HGB URINE DIPSTICK: NEGATIVE
KETONES UR: NEGATIVE mg/dL
Nitrite: POSITIVE — AB
PH: 6 (ref 5.0–8.0)
Protein, ur: 30 mg/dL — AB
SPECIFIC GRAVITY, URINE: 1.019 (ref 1.005–1.030)

## 2016-12-18 LAB — COMPREHENSIVE METABOLIC PANEL
ALK PHOS: 95 U/L (ref 38–126)
ALT: 15 U/L (ref 14–54)
AST: 18 U/L (ref 15–41)
Albumin: 2.6 g/dL — ABNORMAL LOW (ref 3.5–5.0)
Anion gap: 7 (ref 5–15)
BUN: 23 mg/dL — AB (ref 6–20)
CALCIUM: 8.1 mg/dL — AB (ref 8.9–10.3)
CO2: 22 mmol/L (ref 22–32)
CREATININE: 0.71 mg/dL (ref 0.44–1.00)
Chloride: 102 mmol/L (ref 101–111)
GFR calc non Af Amer: >60 mL/min (ref >60)
Glucose, Bld: 169 mg/dL — ABNORMAL HIGH (ref 65–99)
Potassium: 4.4 mmol/L (ref 3.5–5.1)
SODIUM: 131 mmol/L — AB (ref 135–145)
TOTAL PROTEIN: 6.7 g/dL (ref 6.5–8.1)
Total Bilirubin: 1.1 mg/dL (ref 0.3–1.2)

## 2016-12-18 LAB — PROCALCITONIN: Procalcitonin: 0.54 ng/mL

## 2016-12-18 LAB — GLUCOSE, CAPILLARY
GLUCOSE-CAPILLARY: 216 mg/dL — AB (ref 65–99)
Glucose-Capillary: 160 mg/dL — ABNORMAL HIGH (ref 65–99)
Glucose-Capillary: 184 mg/dL — ABNORMAL HIGH (ref 65–99)
Glucose-Capillary: 141 mg/dL — ABNORMAL HIGH (ref 65–99)

## 2016-12-18 LAB — MAGNESIUM: MAGNESIUM: 1.8 mg/dL (ref 1.7–2.4)

## 2016-12-18 LAB — PHOSPHORUS: PHOSPHORUS: 3.8 mg/dL (ref 2.5–4.6)

## 2016-12-18 MED ORDER — METHOCARBAMOL 1000 MG/10ML IJ SOLN
500.0000 mg | Freq: Three times a day (TID) | INTRAVENOUS | Status: DC | PRN
  Administered 2016-12-20 – 2016-12-21 (×3): 500 mg via INTRAVENOUS
  Filled 2016-12-18 (×4): qty 5

## 2016-12-18 MED ORDER — TRIAMCINOLONE ACETONIDE 0.5 % EX CREA: TOPICAL_CREAM | Freq: Every day | CUTANEOUS | Status: DC | PRN

## 2016-12-18 MED ORDER — ONDANSETRON HCL 4 MG/2ML IJ SOLN
4.0000 mg | Freq: Four times a day (QID) | INTRAMUSCULAR | Status: DC | PRN
  Administered 2016-12-25: 4 mg via INTRAVENOUS
  Filled 2016-12-18: qty 2

## 2016-12-18 MED ORDER — SODIUM CHLORIDE 0.9% FLUSH
10.0000 mL | INTRAVENOUS | Status: DC | PRN
  Administered 2016-12-18 – 2016-12-19 (×2): 10 mL
  Filled 2016-12-18 (×2): qty 40

## 2016-12-18 MED ORDER — FAT EMULSION 20 % IV EMUL
240.0000 mL | INTRAVENOUS | Status: DC
  Filled 2016-12-18: qty 250

## 2016-12-18 MED ORDER — FELODIPINE ER 5 MG PO TB24
5.0000 mg | ORAL_TABLET | Freq: Every day | ORAL | Status: DC
  Administered 2016-12-18 – 2016-12-25 (×5): 5 mg via ORAL
  Filled 2016-12-18 (×11): qty 1

## 2016-12-18 MED ORDER — BISACODYL 10 MG RE SUPP
10.0000 mg | Freq: Every day | RECTAL | Status: DC | PRN
  Administered 2016-12-23: 10 mg via RECTAL
  Filled 2016-12-18: qty 1

## 2016-12-18 MED ORDER — TRACE MINERALS CR-CU-MN-SE-ZN 10-1000-500-60 MCG/ML IV SOLN
INTRAVENOUS | Status: DC
  Filled 2016-12-18: qty 1992

## 2016-12-18 MED ORDER — ACETAMINOPHEN 325 MG PO TABS
650.0000 mg | ORAL_TABLET | Freq: Four times a day (QID) | ORAL | Status: DC | PRN
Start: 2016-12-18 — End: 2016-12-25

## 2016-12-18 MED ORDER — INSULIN ASPART 100 UNIT/ML ~~LOC~~ SOLN
0.0000 [IU] | Freq: Three times a day (TID) | SUBCUTANEOUS | Status: DC
  Administered 2016-12-18: 2 [IU] via SUBCUTANEOUS
  Administered 2016-12-18: 4 [IU] via SUBCUTANEOUS
  Administered 2016-12-22 – 2016-12-25 (×3): 2 [IU] via SUBCUTANEOUS

## 2016-12-18 MED ORDER — IOPAMIDOL (ISOVUE-370) INJECTION 76%
INTRAVENOUS | Status: AC
  Administered 2016-12-18: 100 mL
  Filled 2016-12-18: qty 100

## 2016-12-18 MED ORDER — GUAIFENESIN ER 600 MG PO TB12
600.0000 mg | ORAL_TABLET | Freq: Two times a day (BID) | ORAL | Status: DC
  Administered 2016-12-18 – 2016-12-25 (×15): 600 mg via ORAL
  Filled 2016-12-18 (×15): qty 1

## 2016-12-18 MED ORDER — INSULIN DETEMIR 100 UNIT/ML ~~LOC~~ SOLN
10.0000 [IU] | Freq: Two times a day (BID) | SUBCUTANEOUS | Status: DC
  Administered 2016-12-18 – 2016-12-19 (×3): 10 [IU] via SUBCUTANEOUS
  Filled 2016-12-18 (×4): qty 0.1

## 2016-12-18 MED ORDER — OXYCODONE HCL 5 MG PO TABS
5.0000 mg | ORAL_TABLET | ORAL | Status: DC | PRN
  Administered 2016-12-18 – 2016-12-25 (×27): 5 mg via ORAL
  Filled 2016-12-18 (×27): qty 1

## 2016-12-18 MED ORDER — ACETAMINOPHEN 325 MG PO TABS: 650.0000 mg | ORAL_TABLET | ORAL | Status: DC | PRN

## 2016-12-18 MED ORDER — PANCRELIPASE (LIP-PROT-AMYL) 36000-114000 UNITS PO CPEP
36000.0000 [IU] | ORAL_CAPSULE | Freq: Three times a day (TID) | ORAL | Status: DC
Start: 2016-12-18 — End: 2016-12-25
  Administered 2016-12-18 – 2016-12-25 (×23): 36000 [IU] via ORAL
  Filled 2016-12-18 (×24): qty 1

## 2016-12-18 MED ORDER — DIPHENHYDRAMINE HCL 50 MG/ML IJ SOLN
50.0000 mg | Freq: Once | INTRAMUSCULAR | Status: AC
  Administered 2016-12-18: 50 mg via INTRAVENOUS
  Filled 2016-12-18: qty 1

## 2016-12-18 MED ORDER — ENOXAPARIN SODIUM 40 MG/0.4ML ~~LOC~~ SOLN
40.0000 mg | SUBCUTANEOUS | Status: DC
  Administered 2016-12-18 – 2016-12-25 (×8): 40 mg via SUBCUTANEOUS
  Filled 2016-12-18 (×8): qty 0.4

## 2016-12-18 MED ORDER — ONDANSETRON HCL 4 MG PO TABS: 4.0000 mg | ORAL_TABLET | Freq: Four times a day (QID) | ORAL | Status: DC | PRN

## 2016-12-18 MED ORDER — METHOCARBAMOL 1000 MG/10ML IJ SOLN
500.0000 mg | Freq: Once | INTRAVENOUS | Status: AC
  Administered 2016-12-18: 500 mg via INTRAVENOUS
  Filled 2016-12-18: qty 5

## 2016-12-18 MED ORDER — IPRATROPIUM-ALBUTEROL 0.5-2.5 (3) MG/3ML IN SOLN: 3.0000 mL | RESPIRATORY_TRACT | Status: DC | PRN

## 2016-12-18 MED ORDER — HYDROCORTISONE NA SUCCINATE PF 250 MG IJ SOLR
200.0000 mg | Freq: Once | INTRAMUSCULAR | Status: AC
  Administered 2016-12-18: 200 mg via INTRAVENOUS
  Filled 2016-12-18: qty 200

## 2016-12-18 MED ORDER — PANTOPRAZOLE SODIUM 40 MG PO TBEC
40.0000 mg | DELAYED_RELEASE_TABLET | Freq: Two times a day (BID) | ORAL | Status: DC
  Administered 2016-12-18 – 2016-12-25 (×15): 40 mg via ORAL
  Filled 2016-12-18 (×15): qty 1

## 2016-12-18 MED ORDER — ACETAMINOPHEN 650 MG RE SUPP: 650.0000 mg | Freq: Four times a day (QID) | RECTAL | Status: DC | PRN

## 2016-12-18 MED ORDER — KETOTIFEN FUMARATE 0.025 % OP SOLN: 1.0000 [drp] | Freq: Three times a day (TID) | OPHTHALMIC | Status: DC | PRN

## 2016-12-18 MED ORDER — ASPIRIN EC 81 MG PO TBEC
81.0000 mg | DELAYED_RELEASE_TABLET | Freq: Every day | ORAL | Status: DC
  Administered 2016-12-18 – 2016-12-25 (×8): 81 mg via ORAL
  Filled 2016-12-18 (×8): qty 1

## 2016-12-18 MED ORDER — LIDOCAINE 5 % EX PTCH
1.0000 | MEDICATED_PATCH | Freq: Every day | CUTANEOUS | Status: DC | PRN
  Administered 2016-12-18 – 2016-12-24 (×3): 1 via TRANSDERMAL
  Filled 2016-12-18 (×3): qty 1

## 2016-12-18 NOTE — Progress Notes (Signed)
PROGRESS NOTE    JOURNE HALLMARK  WCH:[id number] DOB: 1940/08/14 DOA: 12/17/2016 PCP: Iona Beard, MD    Brief Narrative:  WALLIS SPIZZIRRI is a 76 y.o. female with medical history significant of  adenocarcinoma of the pancreas s/p pancreaticoduodenectomy, diastolic CHF, DM type 2; who presents with complains of fever for the last 2 days. She also reports back spasms that started after working with PT AND OT.   Assessment & Plan:   Principal Problem:   Sepsis ([place]) Active Problems:   HTN (hypertension)   Essential hypertension   Adenocarcinoma of head of pancreas ([place])   HCAP (healthcare-associated pneumonia)   DM type 2 (diabetes mellitus, type 2) ([place])   Protein-calorie malnutrition, severe   Sepsis possibly from health care associated pneumonia and a urinary tract infection:  CT angio of the chest ruled out pulmonary embolism.  Follow up blood cultures and sputum cultures.  duonebs prn.  Resume empiric IV vancomycin and IV zosyn.     Hypertension: well controlled.    Diabetes mellitus:  CBG (last 3)   Recent Labs  12/18/16 0755 12/18/16 1156 12/18/16 1732  GLUCAP 184* 216* 141*    Resume SSI. Resume levemir.     Diastolic heart failure:  It appears compensated.  Daily weights, strict intake and output.    Adenocarcinoma of the pancreas s/p pancreaticoduodenectomy:  Resume pancreas enzymes.  She underwent J tube placement, but couldn't tolerate tube feeds.  PICC line was placed and TPN was started.  She has been on TPN for almost a month now.  But she is able to tolerate soft diet. Will d/c TPN.    Severe protein energy malnutrition:  Dietary consulted.    Normocytic anemia:  Anemia of chronic disease.  Hemoglobin stable.    Hyponatremia:  Improved.     DVT prophylaxis: (Lovenox/) Code Status: (Full) Family Communication: daughter at bedside.  Disposition Plan: possible back to SNF in 1 to 2 days.   Consultants:   None.    Procedures: none.  Antimicrobials: vancomycin and zosyn since admission.   Subjective: Reports her back cramps improved.   Objective: Vitals:   12/18/16 0500 12/18/16 1028 12/18/16 1037 12/18/16 1453  BP: 132/68 (!) 133/59 (!) 133/59 (!) 126/58  Pulse: 72  79 79  Resp: 20   18  Temp: 98.2 F (36.8 C)   98.2 F (36.8 C)  TempSrc: Oral   Oral  SpO2: 97%   100%  Weight:      Height:        Intake/Output Summary (Last 24 hours) at 12/18/16 1528 Last data filed at 12/18/16 1507  Gross per 24 hour  Intake             1120 ml  Output                0 ml  Net             1120 ml   Filed Weights   12/17/16 2212  Weight: 55.3 kg (122 lb)    Examination:  General exam: Appears calm and comfortable , appears malnourished.  Respiratory system: Clear to auscultation. Respiratory effort normal. Cardiovascular system: S1 & S2 heard, RRR. No JVD, murmurs, rubs, gallops or clicks. No pedal edema. Gastrointestinal system: Abdomen is nondistended, soft and nontender. No organomegaly or masses felt. Normal bowel sounds heard. J  tube in place.  Central nervous system: Alert and oriented. No focal neurological deficits. Extremities: Symmetric 5 x 5 power. Skin:  No rashes, lesions or ulcers Psychiatry: Judgement and insight appear normal. Mood & affect appropriate.     Data Reviewed: I have personally reviewed following labs and imaging studies  CBC:  Recent Labs Lab 12/17/16 2204  WBC 8.3  NEUTROABS 6.1  HGB 9.4*  HCT 28.6*  MCV 87.5  PLT 258   Basic Metabolic Panel:  Recent Labs Lab 12/17/16 2204 12/18/16 0513  NA 127* 131*  K 4.9 4.4  CL 100* 102  CO2 19* 22  GLUCOSE 149* 169*  BUN 20 23*  CREATININE 0.67 0.71  CALCIUM 7.8* 8.1*  MG  --  1.8  PHOS  --  3.8   GFR: Estimated Creatinine Clearance: 50.6 mL/min (by C-G formula based on SCr of 0.71 mg/dL). Liver Function Tests:  Recent Labs Lab 12/17/16 2204 12/18/16 0513  AST 27 18  ALT 19 15  ALKPHOS  101 95  BILITOT 1.4* 1.1  PROT 6.5 6.7  ALBUMIN 2.7* 2.6*   No results for input(s): LIPASE, AMYLASE in the last 168 hours. No results for input(s): AMMONIA in the last 168 hours. Coagulation Profile:  Recent Labs Lab 12/17/16 2204  INR 1.16   Cardiac Enzymes: No results for input(s): CKTOTAL, CKMB, CKMBINDEX, TROPONINI in the last 168 hours. BNP (last 3 results) No results for input(s): PROBNP in the last 8760 hours. HbA1C: No results for input(s): HGBA1C in the last 72 hours. CBG:  Recent Labs Lab 12/18/16 0722 12/18/16 0755 12/18/16 1156  GLUCAP 160* 184* 216*   Lipid Profile: No results for input(s): CHOL, HDL, LDLCALC, TRIG, CHOLHDL, LDLDIRECT in the last 72 hours. Thyroid Function Tests: No results for input(s): TSH, T4TOTAL, FREET4, T3FREE, THYROIDAB in the last 72 hours. Anemia Panel: No results for input(s): VITAMINB12, FOLATE, FERRITIN, TIBC, IRON, RETICCTPCT in the last 72 hours. Sepsis Labs:  Recent Labs Lab 12/17/16 2232 12/18/16 0513  PROCALCITON  --  0.54  LATICACIDVEN 1.41  --     No results found for this or any previous visit (from the past 240 hour(s)).       Radiology Studies: Dg Chest 2 View  Result Date: 12/17/2016 CLINICAL DATA:  76 y/o  F; PICC line verification.  Possible sepsis. EXAM: CHEST  2 VIEW COMPARISON:  10/21/2016 CT chest. FINDINGS: Stable normal cardiac silhouette. Right PICC line tip projects over the cavoatrial junction. Left lower lobe patchy consolidation. Right upper quadrant cholecystectomy clips. Bones are unremarkable. IMPRESSION: Left lower lobe patchy consolidation may represent pneumonia. Right PICC line tip projects over cavoatrial junction. Electronically Signed   By: Kristine Garbe M.D.   On: 12/17/2016 22:38   Ct Angio Chest Pe W And/or Wo Contrast  Result Date: 12/18/2016 CLINICAL DATA:  Chest and back pain. EXAM: CT ANGIOGRAPHY CHEST WITH CONTRAST TECHNIQUE: Multidetector CT imaging of the chest  was performed using the standard protocol during bolus administration of intravenous contrast. Multiplanar CT image reconstructions and MIPs were obtained to evaluate the vascular anatomy. CONTRAST:  58 cc Isovue 370 IV COMPARISON:  Chest CT 10/21/2016 FINDINGS: Cardiovascular: There are no filling defects within the pulmonary arteries to suggest pulmonary embolus. Evaluation of the subsegmental right lower lobe pulmonary arteries is limited given motion and adjacent atelectasis. Ectasia of the ascending aorta measuring 4.2 cm, no evidence of dissection allowing for phase of contrast. There is an aberrant right subclavian artery. Mild aortic atherosclerosis. Mild multi chamber cardiomegaly is new from prior exam. No pericardial effusion. Mediastinum/Nodes: No mediastinal or hilar adenopathy. The esophagus decompressed. Left thyroid  nodule on prior exam not as well-defined currently. Right upper extremity PICC in place. Lungs/Pleura: Dependent lower lobe opacities with air bronchograms, favoring atelectasis. Tiny subpleural nodules in the right upper lobe (image 11 and 35) are unchanged from prior exam. Reticulonodular opacity in the right upper lobe is new (image 39). Linear 8 x 4 mm subpleural opacity in the right upper lobe image 25 is also new from prior. No pleural fluid. No pulmonary edema. Upper Abdomen: Multiple hepatic cysts. Musculoskeletal: Degenerative change in the spine without acute abnormality. Review of the MIP images confirms the above findings. IMPRESSION: 1. No pulmonary embolus. 2. New reticulonodular opacity in the right upper lobe is likely infectious or inflammatory. Small subpleural 8 x 4 mm nodular opacity in the right upper lobe was also new, nonspecific. Recommend short-term follow-up CT in 3 months to evaluate for resolution or change. 3. Bilateral lower lobe opacities with air bronchograms, favor atelectasis. 4. Mild cardiomegaly is new from prior CT. 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 Aortic Atherosclerosis (ICD10-I70.0). Aortic aneurysm NOS (ICD10-I71.9). Electronically Signed   By: Jeb Levering M.D.   On: 12/18/2016 07:08        Scheduled Meds: . aspirin EC  81 mg Oral Daily  . enoxaparin (LOVENOX) injection  40 mg Subcutaneous Q24H  . felodipine  5 mg Oral Daily  . guaiFENesin  600 mg Oral BID  . insulin aspart  0-10 Units Subcutaneous TID WC  . insulin detemir  10 Units Subcutaneous BID  . lipase/protease/amylase  36,000 Units Oral TID WC  . pantoprazole  40 mg Oral BID   Continuous Infusions: . methocarbamol (ROBAXIN)  IV    . piperacillin-tazobactam (ZOSYN)  IV Stopped (12/18/16 1424)  . vancomycin       LOS: 0 days    Time spent: *35 minutes.     Hosie Poisson, MD [facility] Pager [phone] If 7PM-7AM, please contact night-coverage [email or web address] Password [facility] 12/18/2016, 3:28 PM

## 2016-12-18 NOTE — Progress Notes (Signed)
Initial Nutrition Assessment  DOCUMENTATION CODES:   Severe malnutrition in context of chronic illness  INTERVENTION:   -TPN management per pharmacy  NUTRITION DIAGNOSIS:   Malnutrition (Severe) related to chronic illness (pancreatic cancer s/p whipple) as evidenced by mild depletion of body fat, moderate depletion of body fat, moderate depletions of muscle mass, severe depletion of muscle mass, percent weight loss.  GOAL:   Patient will meet greater than or equal to 90% of their needs  MONITOR:   PO intake, Supplement acceptance, Labs, Weight trends, Skin, I & O's  REASON FOR ASSESSMENT:   Malnutrition Screening Tool, Consult New TPN/TNA  ASSESSMENT:   Rhonda Spencer is a 76 y.o. female with medical history significant of  adenocarcinoma of the pancreas s/p pancreaticoduodenectomy, diastolic CHF, DM type 2; who presents with complains of fever for the last 2 days. Fever went up to 102F at the nursing facility.   Pt admitted with sepsis secondary to HCAP.   Spoke with pt and daughter at bedside, who is familiar to this RD due to recent prior admission. Pt reports she continues to receive most of her nutrition via TPN, however, is tolerating solid foods well, but experiences early satiety with meals. Pt reports she will consume liquids and eat a few bites of most items on her meal tray. Pt consumed about 20% of grits from breakfast tray upon RD observation.   Pt and daughter endorse continued wt loss since discharge. Pt has experienced a 31# (20.3%) wt loss within the past 6 months, which is significant for time frame.   Nutrition-Focused physical exam completed. Findings are mild to moderate fat depletion, moderate to severe muscle depletion, and no edema.   Reviewed records from [facility]. Per recordsTPN at outpatient facility: Clinimix E 5/15 total volume 2170 ml infused over 24 hours. Infused at a rate of 90.4 ml/hr. Also was receiving 250 mL Intralipid 20%. Other  pertinent medications included 10 units insulin detemir BID, Creon 36000 units TID, and magnesium. Pt on a regular diet PTA.  Per pharmacy note, pt to receive Clinimix-E 5/15 1992 mL infused at a rate of 83 ml/hr over 24 hours + IVFA 20% 20 ml/hr over 12 hours at 1800. Regimen to provide 1894 kcals and 100 grams of protein, which meets 100% of estimated nutritional needs.   Discussed with pt plan to start TPN this evening. Also discussed importance of PO intake to assist with maintaining gastric function.   Labs reviewed: Na: 131, CBGS: 160-184.   Diet Order:  DIET SOFT Room service appropriate? Yes; Fluid consistency: Thin TPN (CLINIMIX-E) Adult  Skin:   (closed abdominal incision)  Last BM:  12/14/16  Height:   Ht Readings from Last 1 Encounters:  12/17/16 5\' 5"  (1.651 m)    Weight:   Wt Readings from Last 1 Encounters:  12/17/16 122 lb (55.3 kg)    Ideal Body Weight:  56.8 kg  BMI:  Body mass index is 20.3 kg/m.  Estimated Nutritional Needs:   Kcal:  1700-1900  Protein:  95-110 grams  Fluid:  1.7-1.9 L  EDUCATION NEEDS:   Education needs addressed  Stuart Guillen A. Jimmye Norman, RD, LDN, CDE Pager: [phone] After hours Pager: [phone]

## 2016-12-18 NOTE — ED Notes (Signed)
Spoke with pharmacy.  They have rescheduled the benadryl dose to be in line with pre-medication procedures.  Now due at 0415.

## 2016-12-18 NOTE — NC FL2 (Signed)
[place] LEVEL OF CARE SCREENING TOOL     IDENTIFICATION  Patient Name: Rhonda Spencer Birthdate: [birth year] Sex: female Admission Date (Current Location): 12/17/2016  [facility] and [place] Number:  Herbalist and Address:  [facility] [place] [place] [address] [place], [place] [address]      Provider Number: [id number]  Attending Physician Name and Address:  Hosie Poisson, MD  Relative Name and Phone Number:       Current Level of Care: Hospital Recommended Level of Care: [place] Prior Approval Number:    Date Approved/Denied:   PASRR Number: [id number]  Discharge Plan: SNF    Current Diagnoses: Patient Active Problem List   Diagnosis Date Noted  . Sepsis ([place]) 12/18/2016  . HCAP (healthcare-associated pneumonia) 12/18/2016  . DM type 2 (diabetes mellitus, type 2) ([place]) 12/18/2016  . Adenocarcinoma of head of pancreas ([place]) 11/13/2016  . Pancreatic cancer ([place]) 10/14/2016  . Sinus bradycardia 09/23/2015  . Rheumatoid arthritis ([place]) 09/23/2015  . Tricuspid regurgitation 09/23/2015  . Near syncope 09/22/2015  . Orthostasis 09/22/2015  . Essential hypertension 09/22/2015  . Chronic pain 09/22/2015  . GERD (gastroesophageal reflux disease) 04/19/2015  . Abdominal pain, chronic, right lower quadrant 10/12/2012  . AI (aortic insufficiency) 09/09/2012  . Mitral regurgitation 09/09/2012  . Rheumatic heart disease 09/09/2012  . HTN (hypertension) 09/09/2012  . Pre-operative cardiovascular examination 09/09/2012  . Abdominal mass, RLQ (right lower quadrant) 09/07/2012    Orientation RESPIRATION BLADDER Height & Weight     Self, Time, Situation, Place  Normal Continent Weight: 55.3 kg (122 lb) Height:  5\' 5"  (165.1 cm)  BEHAVIORAL SYMPTOMS/MOOD NEUROLOGICAL BOWEL NUTRITION STATUS      Continent Diet, Feeding tube, TNA (Please see DC Summary)  AMBULATORY STATUS COMMUNICATION OF NEEDS Skin   Limited Assist  Verbally Surgical wounds                       Personal Care Assistance Level of Assistance  Bathing, Feeding, Dressing Bathing Assistance: Limited assistance Feeding assistance: Independent Dressing Assistance: Limited assistance     Functional Limitations Info  Sight, Hearing, Speech Sight Info: Adequate Hearing Info: Adequate Speech Info: Adequate    SPECIAL CARE FACTORS FREQUENCY  PT (By licensed PT), OT (By licensed OT)     PT Frequency: 5x/week OT Frequency: 3x/week            Contractures Contractures Info: Not present    Additional Factors Info  Code Status, Allergies, Insulin Sliding Scale Code Status Info: Full  Allergies Info: [place] Trandolapril-verapamil Hcl Er, Atorvastatin, Contrast Media Iodinated Diagnostic Agents   Insulin Sliding Scale Info: 3x daily with meals       Current Medications (12/18/2016):  This is the current hospital active medication list Current Facility-Administered Medications  Medication Dose Route Frequency Provider Last Rate Last Dose  . acetaminophen (TYLENOL) tablet 650 mg  650 mg Oral Q6H PRN Fuller Plan A, MD       Or  . acetaminophen (TYLENOL) suppository 650 mg  650 mg Rectal Q6H PRN Fuller Plan A, MD      . aspirin EC tablet 81 mg  81 mg Oral Daily Smith, Rondell A, MD   81 mg at 12/18/16 1028  . bisacodyl (DULCOLAX) suppository 10 mg  10 mg Rectal Daily PRN Tamala Julian, Rondell A, MD      . enoxaparin (LOVENOX) injection 40 mg  40 mg Subcutaneous Q24H Smith, Rondell A,  MD   40 mg at 12/18/16 1319  . TPN (CLINIMIX-E) Adult   Intravenous Continuous TPN Masters, Jake Church, [facility]       And  . fat emulsion 20 % infusion 240 mL  240 mL Intravenous Continuous TPN Masters, Jake Church, [facility]      . felodipine (PLENDIL) 24 hr tablet 5 mg  5 mg Oral Daily Smith, Rondell A, MD   5 mg at 12/18/16 1028  . guaiFENesin (MUCINEX) 12 hr tablet 600 mg  600 mg Oral BID Fuller Plan A, MD   600 mg at 12/18/16 1028  . insulin aspart (novoLOG)  injection 0-10 Units  0-10 Units Subcutaneous TID WC Fuller Plan A, MD   4 Units at 12/18/16 1318  . insulin detemir (LEVEMIR) injection 10 Units  10 Units Subcutaneous BID Fuller Plan A, MD   10 Units at 12/18/16 1000  . ipratropium-albuterol (DUONEB) 0.5-2.5 (3) MG/3ML nebulizer solution 3 mL  3 mL Nebulization Q4H PRN Smith, Rondell A, MD      . ketotifen (ZADITOR) 0.025 % ophthalmic solution 1 drop  1 drop Left Eye Q8H PRN Smith, Rondell A, MD      . lidocaine (LIDODERM) 5 % 1 patch  1 patch Transdermal Daily PRN Fuller Plan A, MD   1 patch at 12/18/16 1325  . lipase/protease/amylase (CREON) capsule 36,000 Units  36,000 Units Oral TID WC Norval Morton, MD   36,000 Units at 12/18/16 1319  . methocarbamol (ROBAXIN) 500 mg in dextrose 5 % 50 mL IVPB  500 mg Intravenous Q8H PRN Smith, Rondell A, MD      . ondansetron (ZOFRAN) tablet 4 mg  4 mg Oral Q6H PRN Fuller Plan A, MD       Or  . ondansetron (ZOFRAN) injection 4 mg  4 mg Intravenous Q6H PRN Smith, Rondell A, MD      . oxyCODONE (Oxy IR/ROXICODONE) immediate release tablet 5 mg  5 mg Oral Q4H PRN Fuller Plan A, MD   5 mg at 12/18/16 0202  . pantoprazole (PROTONIX) EC tablet 40 mg  40 mg Oral BID Fuller Plan A, MD   40 mg at 12/18/16 1028  . piperacillin-tazobactam (ZOSYN) IVPB 3.375 g  3.375 g Intravenous Q8H Dang, Thuy D, RPH 12.5 mL/hr at 12/18/16 1024 3.375 g at 12/18/16 1024  . sodium chloride flush (NS) 0.9 % injection 10-40 mL  10-40 mL Intracatheter PRN Fuller Plan A, MD   10 mL at 12/18/16 0528  . triamcinolone cream (KENALOG) 0.5 %   Topical Daily PRN Fuller Plan A, MD      . vancomycin (VANCOCIN) IVPB 1000 mg/200 mL premix  1,000 mg Intravenous Q24H Dang, Thuy D, [facility]         Discharge Medications: Please see discharge summary for a list of discharge medications.  Relevant Imaging Results:  Relevant Lab Results:   Additional Information SSN: [id number]. Pt needs administered TNA  Benard Halsted,  LCSWA

## 2016-12-18 NOTE — Evaluation (Signed)
Clinical/Bedside Swallow Evaluation Patient Details  Name: MIRABELLE CYPHERS MRN: [id number] Date of Birth: [birth year]  Today's Date: 12/18/2016 Time: SLP Start Time (ACUTE ONLY): 9485 SLP Stop Time (ACUTE ONLY): 1454 SLP Time Calculation (min) (ACUTE ONLY): 11 min  Past Medical History:  Past Medical History:  Diagnosis Date  . AI (aortic insufficiency)   . Anemia   . History of nuclear stress test 11/2008   bruce myoview; normal pattern of perfusion in allregions, no significant wall motion abnormalities; no ECG changes, no significant ischemia demonstrated, low risk scan   . Hypertension   . MR (mitral regurgitation)   . Rheumatic fever   . Rheumatic heart disease   . Rheumatoid arthritis ([place])   . Sinus bradycardia    Past Surgical History:  Past Surgical History:  Procedure Laterality Date  . ABDOMINAL HYSTERECTOMY    . CHOLECYSTECTOMY    . COLON SURGERY    . ERCP N/A 10/03/2016   Procedure: ENDOSCOPIC RETROGRADE CHOLANGIOPANCREATOGRAPHY (ERCP);  Surgeon: Carol Ada, MD;  Location: [facility];  Service: Endoscopy;  Laterality: N/A;  . EXCISIONAL HEMORRHOIDECTOMY    . HEMI-MICRODISCECTOMY LUMBAR LAMINECTOMY LEVEL 4  1995  . JEJUNOSTOMY N/A 11/13/2016   Procedure: PLACEMENT JEJUNOSTOMY TUBE;  Surgeon: [name], MD;  Location: [place];  Service: General;  Laterality: N/A;  . LAPAROSCOPY N/A 11/13/2016   Procedure: ATTEMPTED DAGNOSTIC LAPAROSCOPY;  Surgeon: [name], MD;  Location: [place];  Service: General;  Laterality: N/A;  . TONSILLECTOMY    . TRANSTHORACIC ECHOCARDIOGRAM  11/2011   EF=>55%; LA mild-mod dilated; mod-severe MR; mild-mod TR with normal systolic pressure; mild-mod AV regurg; mild pulm valve regurg   . UPPER ESOPHAGEAL ENDOSCOPIC ULTRASOUND (EUS) N/A 09/26/2016   Procedure: UPPER ESOPHAGEAL ENDOSCOPIC ULTRASOUND (EUS);  Surgeon: Carol Ada, MD;  Location: [facility];  Service: Endoscopy;  Laterality: N/A;  . UPPER ESOPHAGEAL ENDOSCOPIC ULTRASOUND (EUS)  N/A 10/03/2016   Procedure: UPPER ESOPHAGEAL ENDOSCOPIC ULTRASOUND (EUS);  Surgeon: Carol Ada, MD;  Location: [facility];  Service: Endoscopy;  Laterality: N/A;  . WHIPPLE PROCEDURE N/A 11/13/2016   Procedure: WHIPPLE PROCEDURE;  Surgeon: [name], MD;  Location: [place];  Service: General;  Laterality: N/A;   HPI:  10 D Bowersis a 76 y.o.femalefrom SNF with medical history significant of adenocarcinoma of the pancreas s/p pancreaticoduodenectomy, diastolic CHF, DM type 2; who presents with complains of fever for the last 2 days. Per chart at time of pancreaticoduodenectomya gastric tube was placed. CXR left lower lobe patchy consolidation may represent pneumonia. Chest CT new reticulonodular opacity in the right upper lobe is likely infectious or inflammatory. Small subpleural 8 x 4 mm nodular opacity in the right upper lobe was also new, nonspecific   Assessment / Plan / Recommendation Clinical Impression  Pt reports difficulty swallowing larger pills intermittently. Consumed thin via straw, puree and solid with appropriate mastication and transit. No indications of airway compromise. Given incomplete lower dentition, recommend continue soft texture, thin liquids. Pills with water unless large (whole in puree). Aspiration risk appears low. No further ST needed.  SLP Visit Diagnosis: Dysphagia, unspecified (R13.10)    Aspiration Risk  Mild aspiration risk    Diet Recommendation Thin liquid (soft)   Liquid Administration via: Straw;Cup Medication Administration: Whole meds with liquid Supervision: Patient able to self feed Compensations: Slow rate;Small sips/bites Postural Changes: Seated upright at 90 degrees    Other  Recommendations Oral Care Recommendations: Oral care BID   Follow up Recommendations None  Frequency and Duration            Prognosis        Swallow Study   General HPI: Tava D Bowersis a 76 y.o.femalefrom SNF with medical history  significant of adenocarcinoma of the pancreas s/p pancreaticoduodenectomy, diastolic CHF, DM type 2; who presents with complains of fever for the last 2 days. Per chart at time of pancreaticoduodenectomya gastric tube was placed. CXR left lower lobe patchy consolidation may represent pneumonia. Chest CT new reticulonodular opacity in the right upper lobe is likely infectious or inflammatory. Small subpleural 8 x 4 mm nodular opacity in the right upper lobe was also new, nonspecific Type of Study: Bedside Swallow Evaluation Previous Swallow Assessment:  (none) Diet Prior to this Study: Regular;Thin liquids (soft) Temperature Spikes Noted: No Respiratory Status: Room air History of Recent Intubation: No Behavior/Cognition: Alert;Cooperative;Pleasant mood Oral Cavity Assessment: Other (comment) (thrush on tongue) Oral Care Completed by SLP: No Oral Cavity - Dentition:  (missing several lower) Vision: Functional for self-feeding Self-Feeding Abilities: Able to feed self Patient Positioning: Upright in bed Baseline Vocal Quality: Normal Volitional Cough: Weak Volitional Swallow: Able to elicit    Oral/Motor/Sensory Function Overall Oral Motor/Sensory Function: Within functional limits   Ice Chips Ice chips: Not tested   Thin Liquid Thin Liquid: Within functional limits Presentation: Cup;Straw    Nectar Thick Nectar Thick Liquid: Not tested   Honey Thick Honey Thick Liquid: Not tested   Puree Puree: Within functional limits   Solid   GO   Solid: Within functional limits        Houston Siren 12/18/2016,3:04 PM  Orbie Pyo Colvin Caroli.Ed [employer] [phone]

## 2016-12-18 NOTE — ED Notes (Signed)
CT notified of solu-cortef administration time.

## 2016-12-18 NOTE — Progress Notes (Addendum)
[place] NOTE   Pharmacy Consult for TPN Indication: Intolerance to enteral feeding  Patient Measurements: Height: 5\' 5"  (165.1 cm) Weight: 122 lb (55.3 kg) IBW/kg (Calculated) : 57 TPN AdjBW (KG): 55.3 Body mass index is 20.3 kg/m.   Assessment:  76 yo female with Pancreatic cancer with pre-op evaluation of moderate Protein Calorie Malnutrition on 10/28/16.  Pt is s/p Whipple procedure and placement of J-tube.  Pt was on Vital-AF but has not been able to tolerate advancement beyond 30 ml/hr (goal 60 ml/hr) and is not meeting nutritional goals per calorie count. Pt was discharged at the end of August on TPN, this was continued when she went to a SNF. We had just begun cyclic TPN over 16 hours as she was getting discharged. She was readmitted 9/12 due to fever, SOB, fatigue and an additional 10 lb weight loss.   GI:  S/P whipple procedure 11/13/16 due to pancreatic cancer. Unable to tolerate TF previously. Has G/J tube. Receiving Creon, ppi bid Endo: On a unique ssi, essentially sensitive scale, also on Levemir, cbgs have been < 190 so far; rec'd hydrocortisone 200 mg x1 last night Insulin requirements in the past 24 hours: 2 units since admit Lytes: Na 131, CoCa 9.2, Ca x Phos < 55 Renal:  SCr 0.7 Pulm:  RA Cards: Ao insufficiency, HTN, hx Rheumatic heart dz.  BP high, hr wnl -has prn agents for HTN, also on asa + felodipine Hepatobil:  LFTs wnl.  Palb is low indicating nutritional deficit, and TG wnl Neuro: low dose Benadryl for anxiety; pain controlled w/ prn morphine and oxycodone ID: on vanc/zosyn day #1 for sepsis, blood cultures/urine cultures pending Best Practices: Lovenox TPN Access: PICC placed on 8/25 TPN start date: 9/13, but was receiving TPN prior to admission  Nutritional Goals: Per RD 8/27 KCal:  1600-1800 kcal Protein: 80-90 g  Current Nutrition:  TPN at outpatient facility: Clinimix E 5/15 total volume 2170 ml infused  over 24 hours. Infused at a rate of 90.4 ml/hr. Also was receiving 250 mL Intralipid 20%   Plan:  -Will make a slight reduction in TPN to better match previous nutrition goals, pt has also had recent weight loss -Clinimix-E 5/15 1992 mL infused at a rate of 83 ml/hr over 24 hours + IVFA 20% 20 ml/hr over 12 hours -This will provide 100 g protein and 1894 kcal -Add MVI and Trace elements to TPN -TPN labs q Mon/Thurs -F/u updated RD assessment and goals -Patient was previously receiving cyclic TPN when she was admitted in August, will proceed with infusing over 24 hours for now -Pt also previously required insulin in TPN, however the TPN she was receiving at the SNF did not have insulin in it -Monitor cbgs     Hughes Better, PharmD, BCPS Clinical Pharmacist 12/18/2016 9:28 AM    Addendum -Received consult to discontinue TPN -Discussed with MD, pt is taking in soft diet -MD has discussed with family and they are agreeable -Will discontinue TPN orders for tonight   Harvel Quale  12/18/2016 2:14 PM

## 2016-12-18 NOTE — ED Notes (Addendum)
Attempted report x1.  Receiving RN unable to take report at this.  Requests this RN call back in 5 minutes.

## 2016-12-19 LAB — BLOOD CULTURE ID PANEL (REFLEXED)
Acinetobacter baumannii: NOT DETECTED
CANDIDA GLABRATA: NOT DETECTED
CANDIDA PARAPSILOSIS: NOT DETECTED
CANDIDA TROPICALIS: NOT DETECTED
Candida albicans: NOT DETECTED
Candida krusei: NOT DETECTED
Carbapenem resistance: NOT DETECTED
ENTEROCOCCUS SPECIES: NOT DETECTED
Enterobacter cloacae complex: NOT DETECTED
Enterobacteriaceae species: NOT DETECTED
Escherichia coli: NOT DETECTED
HAEMOPHILUS INFLUENZAE: NOT DETECTED
Klebsiella oxytoca: NOT DETECTED
Klebsiella pneumoniae: NOT DETECTED
LISTERIA MONOCYTOGENES: NOT DETECTED
METHICILLIN RESISTANCE: DETECTED — AB
Neisseria meningitidis: NOT DETECTED
PROTEUS SPECIES: NOT DETECTED
Pseudomonas aeruginosa: NOT DETECTED
SERRATIA MARCESCENS: NOT DETECTED
STREPTOCOCCUS PYOGENES: NOT DETECTED
Staphylococcus aureus (BCID): NOT DETECTED
Staphylococcus species: DETECTED — AB
Streptococcus agalactiae: NOT DETECTED
Streptococcus pneumoniae: NOT DETECTED
Streptococcus species: NOT DETECTED
Vancomycin resistance: NOT DETECTED

## 2016-12-19 LAB — COMPREHENSIVE METABOLIC PANEL
ALT: 14 U/L (ref 14–54)
AST: 18 U/L (ref 15–41)
Albumin: 2.5 g/dL — ABNORMAL LOW (ref 3.5–5.0)
Alkaline Phosphatase: 84 U/L (ref 38–126)
Anion gap: 4 — ABNORMAL LOW (ref 5–15)
BILIRUBIN TOTAL: 0.7 mg/dL (ref 0.3–1.2)
BUN: 17 mg/dL (ref 6–20)
CALCIUM: 8.4 mg/dL — AB (ref 8.9–10.3)
CO2: 24 mmol/L (ref 22–32)
CREATININE: 0.75 mg/dL (ref 0.44–1.00)
Chloride: 105 mmol/L (ref 101–111)
GFR calc Af Amer: >60 mL/min (ref >60)
Glucose, Bld: 62 mg/dL — ABNORMAL LOW (ref 65–99)
Potassium: 3.4 mmol/L — ABNORMAL LOW (ref 3.5–5.1)
Sodium: 133 mmol/L — ABNORMAL LOW (ref 135–145)
TOTAL PROTEIN: 6.4 g/dL — AB (ref 6.5–8.1)

## 2016-12-19 LAB — DIFFERENTIAL
Basophils Absolute: 0 10*3/uL (ref 0.0–0.1)
Basophils Relative: 0 %
EOS ABS: 0.1 10*3/uL (ref 0.0–0.7)
EOS PCT: 1 %
LYMPHS ABS: 2.3 10*3/uL (ref 0.7–4.0)
Lymphocytes Relative: 30 %
Monocytes Absolute: 1.3 10*3/uL — ABNORMAL HIGH (ref 0.1–1.0)
Monocytes Relative: 17 %
NEUTROS PCT: 52 %
Neutro Abs: 4 10*3/uL (ref 1.7–7.7)

## 2016-12-19 LAB — GLUCOSE, CAPILLARY
GLUCOSE-CAPILLARY: 150 mg/dL — AB (ref 65–99)
GLUCOSE-CAPILLARY: 54 mg/dL — AB (ref 65–99)
GLUCOSE-CAPILLARY: 67 mg/dL (ref 65–99)
Glucose-Capillary: 68 mg/dL (ref 65–99)
Glucose-Capillary: 91 mg/dL (ref 65–99)
Glucose-Capillary: 71 mg/dL (ref 65–99)

## 2016-12-19 LAB — CBC
HCT: 26.6 % — ABNORMAL LOW (ref 36.0–46.0)
Hemoglobin: 8.7 g/dL — ABNORMAL LOW (ref 12.0–15.0)
MCH: 28.5 pg (ref 26.0–34.0)
MCHC: 32.7 g/dL (ref 30.0–36.0)
MCV: 87.2 fL (ref 78.0–100.0)
PLATELETS: 286 10*3/uL (ref 150–400)
RBC: 3.05 MIL/uL — ABNORMAL LOW (ref 3.87–5.11)
RDW: 13.3 % (ref 11.5–15.5)
WBC: 7.7 10*3/uL (ref 4.0–10.5)

## 2016-12-19 LAB — MAGNESIUM: MAGNESIUM: 1.9 mg/dL (ref 1.7–2.4)

## 2016-12-19 LAB — PHOSPHORUS: Phosphorus: 3.1 mg/dL (ref 2.5–4.6)

## 2016-12-19 MED ORDER — ENSURE ENLIVE PO LIQD
237.0000 mL | Freq: Three times a day (TID) | ORAL | Status: DC
  Administered 2016-12-19 – 2016-12-25 (×17): 237 mL via ORAL

## 2016-12-19 MED ORDER — POTASSIUM CHLORIDE CRYS ER 20 MEQ PO TBCR
40.0000 meq | EXTENDED_RELEASE_TABLET | Freq: Once | ORAL | Status: AC
  Administered 2016-12-19: 40 meq via ORAL
  Filled 2016-12-19: qty 2

## 2016-12-19 NOTE — Progress Notes (Signed)
PROGRESS NOTE    Rhonda Spencer  OZD:[id number] DOB: 1940/09/10 DOA: 12/17/2016 PCP: Iona Beard, MD    Brief Narrative:  Rhonda Spencer is a 76 y.o. female with medical history significant of  adenocarcinoma of the pancreas s/p pancreaticoduodenectomy, diastolic CHF, DM type 2; who presents with complains of fever for the last 2 days. She was found to have hcap and uti and coag neg staph in the urine and one set of blood cultures.   Assessment & Plan:   Principal Problem:   Sepsis ([place]) Active Problems:   HTN (hypertension)   Essential hypertension   Adenocarcinoma of head of pancreas ([place])   HCAP (healthcare-associated pneumonia)   DM type 2 (diabetes mellitus, type 2) ([place])   Protein-calorie malnutrition, severe   Sepsis possibly from health care associated pneumonia and a urinary tract infection: CT angio of the chest ruled out pulmonary embolism.  Urine cultures grew coag neg staph sensitivities are pending.  Blood cultures one set grew coag neg staph, methicillin resistance on the BCID.  duonebs prn.  Resume empiric IV vancomycin and IV zosyn.  Remove the PICC used for TPN, repeat blood cultures in am.      Hypertension: well controlled.    Diabetes mellitus:  CBG (last 3)   Recent Labs  12/19/16 0824 12/19/16 0844 12/19/16 1214  GLUCAP 67 68 91    Resume SSI.stop levemir as we stopped the TNA and her po intake is not great.     Diastolic heart failure:  It appears compensated.  Daily weights, strict intake and output.    Adenocarcinoma of the pancreas s/p pancreaticoduodenectomy:  Resume pancreas enzymes.  She underwent J tube placement, but couldn't tolerate tube feeds.  PICC line was placed and TPN was started.  She has been on TPN for almost a month now.  But she is able to tolerate soft diet. Will d/c TPN and PICC line.  Discussed the plan with Dr Barry Dienes.    Severe protein energy malnutrition:  Dietary consulted.    Normocytic anemia:   Anemia of chronic disease.  Hemoglobin stable.    Hyponatremia:  Improved.     DVT prophylaxis: (Lovenox/) Code Status: (Full) Family Communication: daughter at bedside.  Disposition Plan: home in 2 days.   Consultants:   None.   Procedures: none.  Antimicrobials: vancomycin and zosyn since admission.   Subjective: Comfortable no new complaints.   Objective: Vitals:   12/18/16 2122 12/19/16 0440 12/19/16 1257 12/19/16 1424  BP: (!) 126/52 138/67 133/62 111/77  Pulse: 71 70  72  Resp: 18 18  16   Temp: 97.6 F (36.4 C) 98.2 F (36.8 C)  98.3 F (36.8 C)  TempSrc: Oral Oral  Oral  SpO2: 100% 100%  100%  Weight:      Height:        Intake/Output Summary (Last 24 hours) at 12/19/16 1716 Last data filed at 12/19/16 2595  Gross per 24 hour  Intake              430 ml  Output                0 ml  Net              430 ml   Filed Weights   12/17/16 2212  Weight: 55.3 kg (122 lb)    Examination:  General exam: Appears calm and comfortable , appears malnourished.  Respiratory system: Clear to auscultation. Respiratory effort normal. No wheezing or  rhonchi.  Cardiovascular system: S1 & S2 heard, RRR. No JVD.  Gastrointestinal system: Abdomen is soft NT ND bs +  heard. J  tube in place.  Central nervous system: Alert and oriented. No focal neurological deficits. Extremities: Symmetric 5 x 5 power. No pedal edema.  Skin: No rashes, lesions or ulcers Psychiatry: Judgement and insight appear normal. Mood & affect appropriate.     Data Reviewed: I have personally reviewed following labs and imaging studies  CBC:  Recent Labs Lab 12/17/16 2204 12/19/16 0449  WBC 8.3 7.7  NEUTROABS 6.1 4.0  HGB 9.4* 8.7*  HCT 28.6* 26.6*  MCV 87.5 87.2  PLT 283 756   Basic Metabolic Panel:  Recent Labs Lab 12/17/16 2204 12/18/16 0513 12/19/16 0449  NA 127* 131* 133*  K 4.9 4.4 3.4*  CL 100* 102 105  CO2 19* 22 24  GLUCOSE 149* 169* 62*  BUN 20 23* 17    CREATININE 0.67 0.71 0.75  CALCIUM 7.8* 8.1* 8.4*  MG  --  1.8 1.9  PHOS  --  3.8 3.1   GFR: Estimated Creatinine Clearance: 50.6 mL/min (by C-G formula based on SCr of 0.75 mg/dL). Liver Function Tests:  Recent Labs Lab 12/17/16 2204 12/18/16 0513 12/19/16 0449  AST 27 18 18   ALT 19 15 14   ALKPHOS 101 95 84  BILITOT 1.4* 1.1 0.7  PROT 6.5 6.7 6.4*  ALBUMIN 2.7* 2.6* 2.5*   No results for input(s): LIPASE, AMYLASE in the last 168 hours. No results for input(s): AMMONIA in the last 168 hours. Coagulation Profile:  Recent Labs Lab 12/17/16 2204  INR 1.16   Cardiac Enzymes: No results for input(s): CKTOTAL, CKMB, CKMBINDEX, TROPONINI in the last 168 hours. BNP (last 3 results) No results for input(s): PROBNP in the last 8760 hours. HbA1C: No results for input(s): HGBA1C in the last 72 hours. CBG:  Recent Labs Lab 12/19/16 0013 12/19/16 0743 12/19/16 0824 12/19/16 0844 12/19/16 1214  GLUCAP 150* 54* 67 68 91   Lipid Profile: No results for input(s): CHOL, HDL, LDLCALC, TRIG, CHOLHDL, LDLDIRECT in the last 72 hours. Thyroid Function Tests: No results for input(s): TSH, T4TOTAL, FREET4, T3FREE, THYROIDAB in the last 72 hours. Anemia Panel: No results for input(s): VITAMINB12, FOLATE, FERRITIN, TIBC, IRON, RETICCTPCT in the last 72 hours. Sepsis Labs:  Recent Labs Lab 12/17/16 2232 12/18/16 0513  PROCALCITON  --  0.54  LATICACIDVEN 1.41  --     Recent Results (from the past 240 hour(s))  Urine culture     Status: Abnormal (Preliminary result)   Collection Time: 12/17/16 11:45 PM  Result Value Ref Range Status   Specimen Description URINE, CLEAN CATCH  Final   Special Requests Normal  Final   Culture (A)  Final    >=100,000 COLONIES/mL STAPHYLOCOCCUS SPECIES (COAGULASE NEGATIVE)   Report Status PENDING  Incomplete  Culture, blood (Routine x 2)     Status: None (Preliminary result)   Collection Time: 12/18/16 12:00 AM  Result Value Ref Range Status    Specimen Description BLOOD RIGHT HAND RIGHT THUMB  Final   Special Requests   Final    BOTTLES DRAWN AEROBIC AND ANAEROBIC Blood Culture adequate volume   Culture  Setup Time   Final    GRAM POSITIVE COCCI IN CLUSTERS IN BOTH AEROBIC AND ANAEROBIC BOTTLES T.DANG PHARMD 12/19/16 0052 L.CHAMPION    Culture   Final    GRAM POSITIVE COCCI IN CLUSTERS CULTURE REINCUBATED FOR BETTER GROWTH    Report  Status PENDING  Incomplete  Blood Culture ID Panel (Reflexed)     Status: Abnormal   Collection Time: 12/18/16 12:00 AM  Result Value Ref Range Status   Enterococcus species NOT DETECTED NOT DETECTED Final   Vancomycin resistance NOT DETECTED NOT DETECTED Final   Listeria monocytogenes NOT DETECTED NOT DETECTED Final   Staphylococcus species DETECTED (A) NOT DETECTED Final    Comment: CRITICAL RESULT CALLED TO, READ BACK BY AND VERIFIED WITH: T.DANG PHARMD 12/19/16 0052 L.CHAMPION    Staphylococcus aureus NOT DETECTED NOT DETECTED Final   Methicillin resistance DETECTED (A) NOT DETECTED Final    Comment: CRITICAL RESULT CALLED TO, READ BACK BY AND VERIFIED WITH: T.DANG PHARMD 12/19/16 0052 L.CHAMPION    Streptococcus species NOT DETECTED NOT DETECTED Final   Streptococcus agalactiae NOT DETECTED NOT DETECTED Final   Streptococcus pneumoniae NOT DETECTED NOT DETECTED Final   Streptococcus pyogenes NOT DETECTED NOT DETECTED Final   Acinetobacter baumannii NOT DETECTED NOT DETECTED Final   Enterobacteriaceae species NOT DETECTED NOT DETECTED Final   Enterobacter cloacae complex NOT DETECTED NOT DETECTED Final   Escherichia coli NOT DETECTED NOT DETECTED Final   Klebsiella oxytoca NOT DETECTED NOT DETECTED Final   Klebsiella pneumoniae NOT DETECTED NOT DETECTED Final   Proteus species NOT DETECTED NOT DETECTED Final   Serratia marcescens NOT DETECTED NOT DETECTED Final   Carbapenem resistance NOT DETECTED NOT DETECTED Final   Haemophilus influenzae NOT DETECTED NOT DETECTED Final    Neisseria meningitidis NOT DETECTED NOT DETECTED Final   Pseudomonas aeruginosa NOT DETECTED NOT DETECTED Final   Candida albicans NOT DETECTED NOT DETECTED Final   Candida glabrata NOT DETECTED NOT DETECTED Final   Candida krusei NOT DETECTED NOT DETECTED Final   Candida parapsilosis NOT DETECTED NOT DETECTED Final   Candida tropicalis NOT DETECTED NOT DETECTED Final  Culture, blood (Routine x 2)     Status: None (Preliminary result)   Collection Time: 12/18/16 12:25 AM  Result Value Ref Range Status   Specimen Description BLOOD RIGHT HAND  Final   Special Requests IN PEDIATRIC BOTTLE Blood Culture adequate volume  Final   Culture NO GROWTH 1 DAY  Final   Report Status PENDING  Incomplete         Radiology Studies: Dg Chest 2 View  Result Date: 12/17/2016 CLINICAL DATA:  76 y/o  F; PICC line verification.  Possible sepsis. EXAM: CHEST  2 VIEW COMPARISON:  10/21/2016 CT chest. FINDINGS: Stable normal cardiac silhouette. Right PICC line tip projects over the cavoatrial junction. Left lower lobe patchy consolidation. Right upper quadrant cholecystectomy clips. Bones are unremarkable. IMPRESSION: Left lower lobe patchy consolidation may represent pneumonia. Right PICC line tip projects over cavoatrial junction. Electronically Signed   By: Kristine Garbe M.D.   On: 12/17/2016 22:38   Ct Angio Chest Pe W And/or Wo Contrast  Result Date: 12/18/2016 CLINICAL DATA:  Chest and back pain. EXAM: CT ANGIOGRAPHY CHEST WITH CONTRAST TECHNIQUE: Multidetector CT imaging of the chest was performed using the standard protocol during bolus administration of intravenous contrast. Multiplanar CT image reconstructions and MIPs were obtained to evaluate the vascular anatomy. CONTRAST:  58 cc Isovue 370 IV COMPARISON:  Chest CT 10/21/2016 FINDINGS: Cardiovascular: There are no filling defects within the pulmonary arteries to suggest pulmonary embolus. Evaluation of the subsegmental right lower lobe  pulmonary arteries is limited given motion and adjacent atelectasis. Ectasia of the ascending aorta measuring 4.2 cm, no evidence of dissection allowing for phase of contrast. There is  an aberrant right subclavian artery. Mild aortic atherosclerosis. Mild multi chamber cardiomegaly is new from prior exam. No pericardial effusion. Mediastinum/Nodes: No mediastinal or hilar adenopathy. The esophagus decompressed. Left thyroid nodule on prior exam not as well-defined currently. Right upper extremity PICC in place. Lungs/Pleura: Dependent lower lobe opacities with air bronchograms, favoring atelectasis. Tiny subpleural nodules in the right upper lobe (image 11 and 35) are unchanged from prior exam. Reticulonodular opacity in the right upper lobe is new (image 39). Linear 8 x 4 mm subpleural opacity in the right upper lobe image 25 is also new from prior. No pleural fluid. No pulmonary edema. Upper Abdomen: Multiple hepatic cysts. Musculoskeletal: Degenerative change in the spine without acute abnormality. Review of the MIP images confirms the above findings. IMPRESSION: 1. No pulmonary embolus. 2. New reticulonodular opacity in the right upper lobe is likely infectious or inflammatory. Small subpleural 8 x 4 mm nodular opacity in the right upper lobe was also new, nonspecific. Recommend short-term follow-up CT in 3 months to evaluate for resolution or change. 3. Bilateral lower lobe opacities with air bronchograms, favor atelectasis. 4. Mild cardiomegaly is new from prior CT. Unchanged ectasia of the ascending aorta 4.2 cm. Aortic Atherosclerosis (ICD10-I70.0). Aortic aneurysm NOS (ICD10-I71.9). Electronically Signed   By: Jeb Levering M.D.   On: 12/18/2016 07:08        Scheduled Meds: . aspirin EC  81 mg Oral Daily  . enoxaparin (LOVENOX) injection  40 mg Subcutaneous Q24H  . feeding supplement (ENSURE ENLIVE)  237 mL Oral TID BM  . felodipine  5 mg Oral Daily  . guaiFENesin  600 mg Oral BID  . insulin  aspart  0-10 Units Subcutaneous TID WC  . insulin detemir  10 Units Subcutaneous BID  . lipase/protease/amylase  36,000 Units Oral TID WC  . pantoprazole  40 mg Oral BID   Continuous Infusions: . methocarbamol (ROBAXIN)  IV    . piperacillin-tazobactam (ZOSYN)  IV 3.375 g (12/19/16 1552)  . vancomycin Stopped (12/19/16 0012)     LOS: 1 day    Time spent: 35 minutes.     Hosie Poisson, MD [facility] Pager [phone] If 7PM-7AM, please contact night-coverage [email or web address] Password [facility] 12/19/2016, 5:16 PM

## 2016-12-19 NOTE — Progress Notes (Signed)
PHARMACY - PHYSICIAN COMMUNICATION CRITICAL VALUE ALERT - BLOOD CULTURE IDENTIFICATION (BCID)  Results for orders placed or performed during the hospital encounter of 12/17/16  Blood Culture ID Panel (Reflexed) (Collected: 12/18/2016 12:00 AM)  Result Value Ref Range   Enterococcus species NOT DETECTED NOT DETECTED   Vancomycin resistance NOT DETECTED NOT DETECTED   Listeria monocytogenes NOT DETECTED NOT DETECTED   Staphylococcus species DETECTED (A) NOT DETECTED   Staphylococcus aureus NOT DETECTED NOT DETECTED   Methicillin resistance DETECTED (A) NOT DETECTED   Streptococcus species NOT DETECTED NOT DETECTED   Streptococcus agalactiae NOT DETECTED NOT DETECTED   Streptococcus pneumoniae NOT DETECTED NOT DETECTED   Streptococcus pyogenes NOT DETECTED NOT DETECTED   Acinetobacter baumannii NOT DETECTED NOT DETECTED   Enterobacteriaceae species NOT DETECTED NOT DETECTED   Enterobacter cloacae complex NOT DETECTED NOT DETECTED   Escherichia coli NOT DETECTED NOT DETECTED   Klebsiella oxytoca NOT DETECTED NOT DETECTED   Klebsiella pneumoniae NOT DETECTED NOT DETECTED   Proteus species NOT DETECTED NOT DETECTED   Serratia marcescens NOT DETECTED NOT DETECTED   Carbapenem resistance NOT DETECTED NOT DETECTED   Haemophilus influenzae NOT DETECTED NOT DETECTED   Neisseria meningitidis NOT DETECTED NOT DETECTED   Pseudomonas aeruginosa NOT DETECTED NOT DETECTED   Candida albicans NOT DETECTED NOT DETECTED   Candida glabrata NOT DETECTED NOT DETECTED   Candida krusei NOT DETECTED NOT DETECTED   Candida parapsilosis NOT DETECTED NOT DETECTED   Candida tropicalis NOT DETECTED NOT DETECTED    Name of physician (or Provider) Contacted: X. Blount  Changes to prescribed antibiotics required: no change for now   Meleane Selinger D. Mina Marble, PharmD, BCPS Pager:  [phone] 12/19/2016, 1:33 AM

## 2016-12-19 NOTE — Progress Notes (Signed)
Inpatient Diabetes Program Recommendations  AACE/ADA: New Consensus Statement on Inpatient Glycemic Control (2015)  Target Ranges:  Prepandial:   less than 140 mg/dL      Peak postprandial:   less than 180 mg/dL (1-2 hours)      Critically ill patients:  140 - 180 mg/dL   Lab Results  Component Value Date   GLUCAP 91 12/19/2016    Review of Glycemic Control  Diabetes history: DM2 Outpatient Diabetes medications: Levemir 10 units bid, Novolog 0-15 units Q4H Current orders for Inpatient glycemic control: Levemir 10 units bid, Novolog 0-10 units tidwc  On TNA.  Inpatient Diabetes Program Recommendations:    Change Novolog to 0-9 units Q4H. Decrease Levemir to 5 units bid.  Will continue to follow.  Thank you. Lorenda Peck, RD, LDN, CDE Inpatient Diabetes Coordinator [phone]

## 2016-12-19 NOTE — Progress Notes (Signed)
Patient and her family stating that she will go home at discharge instead of back to SNF.  CSW signing off. RNCM aware.  Percell Locus Atanacio Melnyk LCSWA [phone]

## 2016-12-19 NOTE — Progress Notes (Signed)
Brief Nutrition Follow-Up Note  Noted order for calorie count. Pt has not received lunch tray at time of visit, so unable to quantify lunch meal.   MD visiting with pt and family at time of visit.   Per MD notes, plan to d/c TPN, as pt is tolerating an oral diet. Intake has historically been poor, related to early satiety. No meal completion noted, however, pt consumed only about 20% of grits yesterday AM. RD will add Ensure Enlive po TID, each supplement provides 350 kcal and 20 grams of protein.   RD will follow up on Monday, 12/22/16, for calorie count results.   Drexel Ivey A. Jimmye Norman, RD, LDN, CDE Pager: [phone] After hours Pager: [phone]

## 2016-12-19 NOTE — Evaluation (Signed)
Physical Therapy Evaluation Patient Details Name: Rhonda Spencer MRN: [id number] DOB: [birth year] Today's Date: 12/19/2016   History of Present Illness   76 y.o. female from SNF with medical history significant of  adenocarcinoma of the pancreas s/p pancreaticoduodenectomy, diastolic CHF, DM type 2; who presents with complains of fever for the last 2 days. Admission 8/9-8/30/18 for pancreaticoduodenectomy,  gastric tube was placed. CXR left lower lobe patchy consolidation may represent pneumonia. Chest CT new reticulonodular opacity in the right upper lobe is likely infectious or inflammatory. Small subpleural 8 x 4 mm nodular opacity in the right upper lobe was also new, nonspecific   Clinical Impression  Pt is modified independent with mobility, she ambulated 200' with RW with no loss of balance. No further PT indicated, no follow up PT needed. Pt feels she at baseline (prior to surgery) with mobility. Will sign off.     Follow Up Recommendations No PT follow up    Equipment Recommendations  None recommended by PT    Recommendations for Other Services       Precautions / Restrictions Precautions Precautions: Other (comment) Precaution Comments: PICC RUE Restrictions Weight Bearing Restrictions: No      Mobility  Bed Mobility Overal bed mobility: Modified Independent             General bed mobility comments: HOB  up, used rail  Transfers Overall transfer level: Modified independent Equipment used: Rolling walker (2 wheeled)                Ambulation/Gait Ambulation/Gait assistance: Modified independent (Device/Increase time) Ambulation Distance (Feet): 200 Feet Assistive device: Rolling walker (2 wheeled) Gait Pattern/deviations: WFL(Within Functional Limits)     General Gait Details: steady, no loss of balance  Stairs            Wheelchair Mobility    Modified Rankin (Stroke Patients Only)       Balance Overall balance assessment: Modified  Independent (pt denies h/o falls in past 1 year)                                           Pertinent Vitals/Pain Pain Assessment: 0-10 Pain Score: 7  Pain Location: mid back Pain Descriptors / Indicators: Spasm Pain Intervention(s): Limited activity within patient's tolerance;Monitored during session;Premedicated before session    Home Living Family/patient expects to be discharged to:: Private residence Living Arrangements: Children Available Help at Discharge: Family Type of Home: House       Home Layout: Two level Home Equipment: Environmental consultant - 4 wheels;Cane - single point Additional Comments: used rollator for going out, cane in home prior to recent surgery; used RW at SNF prior to this admission    Prior Function Level of Independence: Independent with assistive device(s)         Comments: was doing ADLs independently at SNF, walked with RW there, stated she was about to be DCed from PT     Hand Dominance        Extremity/Trunk Assessment   Upper Extremity Assessment Upper Extremity Assessment: Overall WFL for tasks assessed    Lower Extremity Assessment Lower Extremity Assessment: Overall WFL for tasks assessed       Communication   Communication: No difficulties  Cognition Arousal/Alertness: Awake/alert Behavior During Therapy: WFL for tasks assessed/performed Overall Cognitive Status: Within Functional Limits for tasks assessed  General Comments      Exercises     Assessment/Plan    PT Assessment Patent does not need any further PT services  PT Problem List         PT Treatment Interventions      PT Goals (Current goals can be found in the Care Plan section)  Acute Rehab PT Goals Patient Stated Goal: play with her 3 great grandkids PT Goal Formulation: All assessment and education complete, DC therapy    Frequency     Barriers to discharge        Co-evaluation                AM-PAC PT "6 Clicks" Daily Activity  Outcome Measure Difficulty turning over in bed (including adjusting bedclothes, sheets and blankets)?: None Difficulty moving from lying on back to sitting on the side of the bed? : None Difficulty sitting down on and standing up from a chair with arms (e.g., wheelchair, bedside commode, etc,.)?: None Help needed moving to and from a bed to chair (including a wheelchair)?: None Help needed walking in hospital room?: None Help needed climbing 3-5 steps with a railing? : None 6 Click Score: 24    End of Session Equipment Utilized During Treatment: Gait belt Activity Tolerance: Patient tolerated treatment well Patient left: in chair;with call bell/phone within reach;with family/visitor present Nurse Communication: Mobility status      Time: 0100-7121 PT Time Calculation (min) (ACUTE ONLY): 22 min   Charges:   PT Evaluation $PT Eval Low Complexity: 1 Low     PT G CodesPhilomena Doheny 12/19/2016, 12:06 PM  [phone]

## 2016-12-20 ENCOUNTER — Encounter (HOSPITAL_COMMUNITY): Payer: Self-pay

## 2016-12-20 LAB — URINE CULTURE: SPECIAL REQUESTS: NORMAL

## 2016-12-20 LAB — GLUCOSE, CAPILLARY
GLUCOSE-CAPILLARY: 153 mg/dL — AB (ref 65–99)
GLUCOSE-CAPILLARY: 153 mg/dL — AB (ref 65–99)
GLUCOSE-CAPILLARY: 69 mg/dL (ref 65–99)
Glucose-Capillary: 56 mg/dL — ABNORMAL LOW (ref 65–99)
Glucose-Capillary: 80 mg/dL (ref 65–99)

## 2016-12-20 LAB — POTASSIUM: Potassium: 4 mmol/L (ref 3.5–5.1)

## 2016-12-20 LAB — MAGNESIUM: Magnesium: 1.8 mg/dL (ref 1.7–2.4)

## 2016-12-20 LAB — PHOSPHORUS: Phosphorus: 3.4 mg/dL (ref 2.5–4.6)

## 2016-12-20 MED ORDER — INFLUENZA VAC SPLIT HIGH-DOSE 0.5 ML IM SUSY
0.5000 mL | PREFILLED_SYRINGE | INTRAMUSCULAR | Status: AC
Start: 2016-12-21 — End: 2016-12-21
  Administered 2016-12-21: 0.5 mL via INTRAMUSCULAR
  Filled 2016-12-20: qty 0.5

## 2016-12-20 MED ORDER — SULFAMETHOXAZOLE-TRIMETHOPRIM 800-160 MG PO TABS
1.0000 | ORAL_TABLET | Freq: Two times a day (BID) | ORAL | Status: DC
  Administered 2016-12-20: 1 via ORAL
  Filled 2016-12-20: qty 1

## 2016-12-20 MED ORDER — MAGNESIUM SULFATE 50 % IJ SOLN
3.0000 g | Freq: Once | INTRAVENOUS | Status: AC
  Administered 2016-12-20: 3 g via INTRAVENOUS
  Filled 2016-12-20: qty 6

## 2016-12-20 MED ORDER — VANCOMYCIN HCL IN DEXTROSE 1-5 GM/200ML-% IV SOLN
1000.0000 mg | INTRAVENOUS | Status: AC
  Administered 2016-12-20 – 2016-12-23 (×4): 1000 mg via INTRAVENOUS
  Filled 2016-12-20 (×4): qty 200

## 2016-12-20 NOTE — Progress Notes (Signed)
PROGRESS NOTE    Rhonda Spencer  WGN:[id number] DOB: 1940/07/12 DOA: 12/17/2016 PCP: Iona Beard, MD    Brief Narrative:  Rhonda Spencer is a 76 y.o. female with medical history significant of  adenocarcinoma of the pancreas s/p pancreaticoduodenectomy, diastolic CHF, DM type 2; who presents with complains of fever for the last 2 days. She was found to have hcap and uti and coag neg staph in the urine and one set of blood cultures.   Assessment & Plan:   Principal Problem:   Sepsis ([place]) Active Problems:   HTN (hypertension)   Essential hypertension   Adenocarcinoma of head of pancreas ([place])   HCAP (healthcare-associated pneumonia)   DM type 2 (diabetes mellitus, type 2) ([place])   Protein-calorie malnutrition, severe   Sepsis possibly from health care associated pneumonia and a urinary tract infection: CT angio of the chest ruled out pulmonary embolism.  Urine cultures grew coag neg staph sensitive to bactrim.  Blood cultures one set grew coag neg staph, methicillin resistance on the BCID. Possibly a contaminant.  duonebs prn.  Remove the PICC used for TPN, repeat blood cultures done and if they are negative can be discharged home on Monday with oral bactrim.      Hypertension: well controlled.    Diabetes mellitus:  CBG (last 3)   Recent Labs  12/20/16 0814 12/20/16 0900 12/20/16 1232  GLUCAP 56* 80 153*    Resume SSI.stop levemir as we stopped the TNA and her po intake is not great.  Encourage po intake.     Diastolic heart failure:  It appears compensated.  Daily weights, strict intake and output.    Adenocarcinoma of the pancreas s/p pancreaticoduodenectomy:  Resume pancreas enzymes.  She underwent J tube placement, but couldn't tolerate tube feeds.  PICC line was placed and TPN was started.  She has been on TPN for almost a month now.  But she is able to tolerate soft diet. Will d/c TPN and PICC line.  Discussed the plan with Dr Barry Dienes.    Severe  protein energy malnutrition:  Dietary consulted.    Normocytic anemia:  Anemia of chronic disease.  Hemoglobin stable.   Back spasms; unclear etiology. Her mag level is 1.7, ordered IV mag sulfate to see if it will improve. Her calcium is 8.4. Will get TSH.    Hyponatremia:  Improved.   Abnormal CT ; New reticulonodular opacity in the right upper lobe is likely infectious or inflammatory. Small subpleural 8 x 4 mm nodular opacity in the right upper lobe was also new, nonspecific. Recommend short-term follow-up CT in 3 months to evaluate for resolution or Change.     DVT prophylaxis: (Lovenox/) Code Status: (Full) Family Communication: son at bedside.  Disposition Plan: home in 2 days.   Consultants:   None.   Procedures: none.  Antimicrobials: vancomycin and zosyn since admission.   Subjective: Comfortable , some muscle spasms.  Objective: Vitals:   12/19/16 1257 12/19/16 1424 12/19/16 2106 12/20/16 0438  BP: 133/62 111/77 137/61 134/68  Pulse:  72 77 97  Resp:  16 18 18   Temp:  98.3 F (36.8 C) 98.7 F (37.1 C) 98.8 F (37.1 C)  TempSrc:  Oral Oral Oral  SpO2:  100% 100% 100%  Weight:      Height:        Intake/Output Summary (Last 24 hours) at 12/20/16 1417 Last data filed at 12/20/16 1000  Gross per 24 hour  Intake  710 ml  Output              800 ml  Net              -90 ml   Filed Weights   12/17/16 2212  Weight: 55.3 kg (122 lb)    Examination:  General exam: Appears calm and comfortable , appears malnourished. No distress.  Respiratory system: good air entry, no wheezing orr honchi.  Cardiovascular system: S1 & S2 heard,regular rate , no JVD.  Gastrointestinal system: Abdomen is SOFT non tender non distended bowel sounds heard,  Central nervous system: Alert and oriented. No focal neurological deficits. Extremities: Symmetric 5 x 5 power. No pedal edema. No cyanosis or clubbing.  Skin: No rashes, lesions or  ulcers Psychiatry: Judgement and insight appear normal. Mood & affect appropriate.     Data Reviewed: I have personally reviewed following labs and imaging studies  CBC:  Recent Labs Lab 12/17/16 2204 12/19/16 0449  WBC 8.3 7.7  NEUTROABS 6.1 4.0  HGB 9.4* 8.7*  HCT 28.6* 26.6*  MCV 87.5 87.2  PLT 283 673   Basic Metabolic Panel:  Recent Labs Lab 12/17/16 2204 12/18/16 0513 12/19/16 0449 12/20/16 1115  NA 127* 131* 133*  --   K 4.9 4.4 3.4* 4.0  CL 100* 102 105  --   CO2 19* 22 24  --   GLUCOSE 149* 169* 62*  --   BUN 20 23* 17  --   CREATININE 0.67 0.71 0.75  --   CALCIUM 7.8* 8.1* 8.4*  --   MG  --  1.8 1.9 1.8  PHOS  --  3.8 3.1 3.4   GFR: Estimated Creatinine Clearance: 50.6 mL/min (by C-G formula based on SCr of 0.75 mg/dL). Liver Function Tests:  Recent Labs Lab 12/17/16 2204 12/18/16 0513 12/19/16 0449  AST 27 18 18   ALT 19 15 14   ALKPHOS 101 95 84  BILITOT 1.4* 1.1 0.7  PROT 6.5 6.7 6.4*  ALBUMIN 2.7* 2.6* 2.5*   No results for input(s): LIPASE, AMYLASE in the last 168 hours. No results for input(s): AMMONIA in the last 168 hours. Coagulation Profile:  Recent Labs Lab 12/17/16 2204  INR 1.16   Cardiac Enzymes: No results for input(s): CKTOTAL, CKMB, CKMBINDEX, TROPONINI in the last 168 hours. BNP (last 3 results) No results for input(s): PROBNP in the last 8760 hours. HbA1C: No results for input(s): HGBA1C in the last 72 hours. CBG:  Recent Labs Lab 12/19/16 1722 12/20/16 0003 12/20/16 0814 12/20/16 0900 12/20/16 1232  GLUCAP 71 69 56* 80 153*   Lipid Profile: No results for input(s): CHOL, HDL, LDLCALC, TRIG, CHOLHDL, LDLDIRECT in the last 72 hours. Thyroid Function Tests: No results for input(s): TSH, T4TOTAL, FREET4, T3FREE, THYROIDAB in the last 72 hours. Anemia Panel: No results for input(s): VITAMINB12, FOLATE, FERRITIN, TIBC, IRON, RETICCTPCT in the last 72 hours. Sepsis Labs:  Recent Labs Lab 12/17/16 2232  12/18/16 0513  PROCALCITON  --  0.54  LATICACIDVEN 1.41  --     Recent Results (from the past 240 hour(s))  Urine culture     Status: Abnormal   Collection Time: 12/17/16 11:45 PM  Result Value Ref Range Status   Specimen Description URINE, CLEAN CATCH  Final   Special Requests Normal  Final   Culture (A)  Final    >=100,000 COLONIES/mL STAPHYLOCOCCUS SPECIES (COAGULASE NEGATIVE)   Report Status 12/20/2016 FINAL  Final   Organism ID, Bacteria STAPHYLOCOCCUS SPECIES (COAGULASE NEGATIVE) (  A)  Final      Susceptibility   Staphylococcus species (coagulase negative) - MIC*    CIPROFLOXACIN 4 RESISTANT Resistant     GENTAMICIN <=0.5 SENSITIVE Sensitive     NITROFURANTOIN <=16 SENSITIVE Sensitive     OXACILLIN >=4 RESISTANT Resistant     TETRACYCLINE >=16 RESISTANT Resistant     VANCOMYCIN 2 SENSITIVE Sensitive     TRIMETH/SULFA <=10 SENSITIVE Sensitive     CLINDAMYCIN >=8 RESISTANT Resistant     RIFAMPIN <=0.5 SENSITIVE Sensitive     Inducible Clindamycin NEGATIVE Sensitive     * >=100,000 COLONIES/mL STAPHYLOCOCCUS SPECIES (COAGULASE NEGATIVE)  Culture, blood (Routine x 2)     Status: Abnormal   Collection Time: 12/18/16 12:00 AM  Result Value Ref Range Status   Specimen Description BLOOD RIGHT HAND RIGHT THUMB  Final   Special Requests   Final    BOTTLES DRAWN AEROBIC AND ANAEROBIC Blood Culture adequate volume   Culture  Setup Time   Final    GRAM POSITIVE COCCI IN CLUSTERS IN BOTH AEROBIC AND ANAEROBIC BOTTLES T.DANG PHARMD 12/19/16 0052 L.CHAMPION    Culture (A)  Final    STAPHYLOCOCCUS SPECIES (COAGULASE NEGATIVE) THE SIGNIFICANCE OF ISOLATING THIS ORGANISM FROM A SINGLE SET OF BLOOD CULTURES WHEN MULTIPLE SETS ARE DRAWN IS UNCERTAIN. PLEASE NOTIFY THE MICROBIOLOGY DEPARTMENT WITHIN ONE WEEK IF SPECIATION AND SENSITIVITIES ARE REQUIRED.    Report Status 12/20/2016 FINAL  Final  Blood Culture ID Panel (Reflexed)     Status: Abnormal   Collection Time: 12/18/16 12:00 AM   Result Value Ref Range Status   Enterococcus species NOT DETECTED NOT DETECTED Final   Vancomycin resistance NOT DETECTED NOT DETECTED Final   Listeria monocytogenes NOT DETECTED NOT DETECTED Final   Staphylococcus species DETECTED (A) NOT DETECTED Final    Comment: CRITICAL RESULT CALLED TO, READ BACK BY AND VERIFIED WITH: T.DANG PHARMD 12/19/16 0052 L.CHAMPION    Staphylococcus aureus NOT DETECTED NOT DETECTED Final   Methicillin resistance DETECTED (A) NOT DETECTED Final    Comment: CRITICAL RESULT CALLED TO, READ BACK BY AND VERIFIED WITH: T.DANG PHARMD 12/19/16 0052 L.CHAMPION    Streptococcus species NOT DETECTED NOT DETECTED Final   Streptococcus agalactiae NOT DETECTED NOT DETECTED Final   Streptococcus pneumoniae NOT DETECTED NOT DETECTED Final   Streptococcus pyogenes NOT DETECTED NOT DETECTED Final   Acinetobacter baumannii NOT DETECTED NOT DETECTED Final   Enterobacteriaceae species NOT DETECTED NOT DETECTED Final   Enterobacter cloacae complex NOT DETECTED NOT DETECTED Final   Escherichia coli NOT DETECTED NOT DETECTED Final   Klebsiella oxytoca NOT DETECTED NOT DETECTED Final   Klebsiella pneumoniae NOT DETECTED NOT DETECTED Final   Proteus species NOT DETECTED NOT DETECTED Final   Serratia marcescens NOT DETECTED NOT DETECTED Final   Carbapenem resistance NOT DETECTED NOT DETECTED Final   Haemophilus influenzae NOT DETECTED NOT DETECTED Final   Neisseria meningitidis NOT DETECTED NOT DETECTED Final   Pseudomonas aeruginosa NOT DETECTED NOT DETECTED Final   Candida albicans NOT DETECTED NOT DETECTED Final   Candida glabrata NOT DETECTED NOT DETECTED Final   Candida krusei NOT DETECTED NOT DETECTED Final   Candida parapsilosis NOT DETECTED NOT DETECTED Final   Candida tropicalis NOT DETECTED NOT DETECTED Final  Culture, blood (Routine x 2)     Status: None (Preliminary result)   Collection Time: 12/18/16 12:25 AM  Result Value Ref Range Status   Specimen Description  BLOOD RIGHT HAND  Final   Special Requests IN PEDIATRIC  BOTTLE Blood Culture adequate volume  Final   Culture NO GROWTH 2 DAYS  Final   Report Status PENDING  Incomplete         Radiology Studies: No results found.      Scheduled Meds: . aspirin EC  81 mg Oral Daily  . enoxaparin (LOVENOX) injection  40 mg Subcutaneous Q24H  . feeding supplement (ENSURE ENLIVE)  237 mL Oral TID BM  . felodipine  5 mg Oral Daily  . guaiFENesin  600 mg Oral BID  . [START ON 12/21/2016] Influenza vac split quadrivalent PF  0.5 mL Intramuscular Tomorrow-1000  . insulin aspart  0-10 Units Subcutaneous TID WC  . lipase/protease/amylase  36,000 Units Oral TID WC  . pantoprazole  40 mg Oral BID   Continuous Infusions: . methocarbamol (ROBAXIN)  IV 500 mg (12/20/16 1100)  . piperacillin-tazobactam (ZOSYN)  IV Stopped (12/20/16 1155)  . vancomycin Stopped (12/19/16 2306)     LOS: 2 days    Time spent: 35 minutes.     Hosie Poisson, MD [facility] Pager [phone] If 7PM-7AM, please contact night-coverage [email or web address] Password [email or web address] 12/20/2016, 2:17 PM

## 2016-12-20 NOTE — Evaluation (Signed)
Occupational Therapy Evaluation Patient Details Name: Rhonda Spencer MRN: [id number] DOB: [birth year] Today's Date: 12/20/2016    History of Present Illness  76 y.o. female with medical history significant of  adenocarcinoma of the pancreas s/p pancreaticoduodenectomy, diastolic CHF, DM type 2; who presents with complains of fever for the last 2 days. Admission 8/9-8/30/18 for pancreaticoduodenectomy,  gastric tube was placed. CXR left lower lobe patchy consolidation may represent pneumonia. Chest CT new reticulonodular opacity in the right upper lobe is likely infectious or inflammatory. Small subpleural 8 x 4 mm nodular opacity in the right upper lobe was also new, nonspecific    Clinical Impression   Pt admitted with the above diagnoses and presents with below problem list. PTA pt was mod I with ADLs. Pt is currently close to baseline with ADLs. Provided education on energy conservation strategies and using a shower seat. No further OT needs indicated at this time. OT signing off.     Follow Up Recommendations  No OT follow up    Equipment Recommendations  3 in 1 bedside commode    Recommendations for Other Services       Precautions / Restrictions Restrictions Weight Bearing Restrictions: No      Mobility Bed Mobility               General bed mobility comments: up in chair  Transfers Overall transfer level: Modified independent Equipment used: 1 person hand held assist             General transfer comment: from recliner    Balance Overall balance assessment: Modified Independent                                         ADL either performed or assessed with clinical judgement   ADL Overall ADL's : Needs assistance/impaired Eating/Feeding: Set up;Sitting   Grooming: Set up;Supervision/safety;Standing   Upper Body Bathing: Set up;Sitting   Lower Body Bathing: Sit to/from stand;Supervison/ safety   Upper Body Dressing : Set up;Sitting    Lower Body Dressing: Supervision/safety;Sit to/from stand   Toilet Transfer: Supervision/safety;Ambulation   Toileting- Clothing Manipulation and Hygiene: Supervision/safety   Tub/ Shower Transfer: Walk-in shower   Functional mobility during ADLs: Supervision/safety General ADL Comments: Provided energy conservation handout. Discussed 3n1 as shower seat recommendation. Pt had just finished bathing/dressing upon OT arrival.      Vision         Perception     Praxis      Pertinent Vitals/Pain Pain Assessment: Faces Faces Pain Scale: Hurts whole lot Pain Location: mid back Pain Descriptors / Indicators: Spasm Pain Intervention(s): Limited activity within patient's tolerance;Monitored during session;Repositioned;Heat applied;Other (comment) (RN on the way into to give med as OT leaving. )     Hand Dominance     Extremity/Trunk Assessment Upper Extremity Assessment Upper Extremity Assessment: Overall WFL for tasks assessed   Lower Extremity Assessment Lower Extremity Assessment: Defer to PT evaluation;Overall WFL for tasks assessed       Communication Communication Communication: No difficulties   Cognition Arousal/Alertness: Awake/alert Behavior During Therapy: WFL for tasks assessed/performed Overall Cognitive Status: Within Functional Limits for tasks assessed                                     General Comments  Exercises     Shoulder Instructions      Home Living Family/patient expects to be discharged to:: Private residence Living Arrangements: Children Available Help at Discharge: Family Type of Home: House       Home Layout: Two level Alternate Level Stairs-Number of Steps: 8   Bathroom Shower/Tub: Walk-in shower         Home Equipment: Environmental consultant - 4 wheels;Cane - single point   Additional Comments: used rollator for going out, cane in home prior to recent surgery; used RW at [facility] prior to this admission. At SNF PTA due to  PICC line which has been d/c. Lives with daughter.       Prior Functioning/Environment Level of Independence: Independent with assistive device(s)        Comments: was doing ADLs independently at SNF, walked with RW there, stated she was about to be DCed from PT        OT Problem List: Decreased activity tolerance;Decreased knowledge of precautions;Decreased knowledge of use of DME or AE;Pain      OT Treatment/Interventions:      OT Goals(Current goals can be found in the care plan section) Acute Rehab OT Goals Patient Stated Goal: play with her 3 great grandkids OT Goal Formulation: With patient  OT Frequency:     Barriers to D/C:            Co-evaluation              AM-PAC PT "6 Clicks" Daily Activity     Outcome Measure Help from another person eating meals?: None Help from another person taking care of personal grooming?: None Help from another person toileting, which includes using toliet, bedpan, or urinal?: None Help from another person bathing (including washing, rinsing, drying)?: A Little Help from another person to put on and taking off regular upper body clothing?: None Help from another person to put on and taking off regular lower body clothing?: None 6 Click Score: 23   End of Session Nurse Communication: Other (comment) (heat pack applied to mid back)  Activity Tolerance: Patient limited by pain;Other (comment) (muscle spasms in mid back) Patient left: in chair;with call bell/phone within reach;with family/visitor present  OT Visit Diagnosis: Pain;Unsteadiness on feet (R26.81)                Time: 7209-4709 OT Time Calculation (min): 16 min Charges:  OT General Charges $OT Visit: 1 Visit OT Evaluation $OT Eval Low Complexity: 1 Low G-Codes:       Hortencia Pilar 12/20/2016, 11:09 AM

## 2016-12-21 LAB — TSH: TSH: 1.517 u[IU]/mL (ref 0.350–4.500)

## 2016-12-21 LAB — GLUCOSE, CAPILLARY
GLUCOSE-CAPILLARY: 122 mg/dL — AB (ref 65–99)
GLUCOSE-CAPILLARY: 154 mg/dL — AB (ref 65–99)
GLUCOSE-CAPILLARY: 152 mg/dL — AB (ref 65–99)

## 2016-12-21 NOTE — Progress Notes (Signed)
PROGRESS NOTE    Rhonda Spencer  LGX:[id number] DOB: 28-Feb-1941 DOA: 12/17/2016 PCP: Iona Beard, MD    Brief Narrative:  Rhonda Spencer is a 76 y.o. female with medical history significant of  adenocarcinoma of the pancreas s/p pancreaticoduodenectomy, diastolic CHF, DM type 2; who presents with complains of fever for the last 2 days. She was found to have hcap and uti and coag neg staph in one set of blood cultures, possibly a contaminant. Discontinue the PICC line.  Repeat blood cultures ordered, one set showed coag neg staph , unclear if its the same species as the previous one.   Assessment & Plan:   Principal Problem:   Sepsis ([place]) Active Problems:   HTN (hypertension)   Essential hypertension   Adenocarcinoma of head of pancreas ([place])   HCAP (healthcare-associated pneumonia)   DM type 2 (diabetes mellitus, type 2) ([place])   Protein-calorie malnutrition, severe   Sepsis possibly from health care associated pneumonia and a urinary tract infection: CT angio of the chest ruled out pulmonary embolism.  Urine cultures grew coag neg staph sensitive to bactrim.  Blood cultures one set grew coag neg staph, methicillin resistance on the BCID. Possibly a contaminant.  duonebs prn.  Removed the PICC used for TPN, repeat blood cultures done , but unfortunately, one set of it showed coag negative staph, suspect this is also a contaminant, awaiting species identification.  Meanwhile she will be started on IV vancomycin empirically.     Hypertension: well controlled.    Diabetes mellitus:  CBG (last 3)   Recent Labs  12/21/16 0825 12/21/16 1249 12/21/16 1715  GLUCAP 122* 154* 152*    Resume SSI, her cbgs are improving.  We have discontinued her leveimir.  We will get hgba1c.    Diastolic heart failure:  It appears compensated.  Daily weights, strict intake and output.    Adenocarcinoma of the pancreas s/p pancreaticoduodenectomy:  Resume pancreas enzymes.  She  underwent J tube placement, but couldn't tolerate tube feeds.  PICC line was placed and TPN was started.  She has been on TPN for almost a month now.  But she is able to tolerate soft diet. Will d/c TPN and PICC line.  Discussed the plan with Dr Barry Dienes.    Severe protein energy malnutrition:  Dietary consulted.    Normocytic anemia:  Anemia of chronic disease.  Hemoglobin stable.   Back spasms; unclear etiology.magnesium replaced.  tsh wnl.  Calcium slghtly low.   Hyponatremia:  Improved.   Abnormal CT ; New reticulonodular opacity in the right upper lobe is likely infectious or inflammatory. Small subpleural 8 x 4 mm nodular opacity in the right upper lobe was also new, nonspecific. Recommend short-term follow-up CT in 3 months to evaluate for resolution or Change.     DVT prophylaxis: (Lovenox/) Code Status: (Full) Family Communication: none at bedside.  Disposition Plan: home tomorrow.   Consultants:   None.   Procedures: none.  Antimicrobials: vancomycin and zosyn since admission.   Subjective: Muscle spasms have improved.  Back discomfort present.   Objective: Vitals:   12/20/16 2222 12/21/16 0341 12/21/16 0457 12/21/16 1523  BP: (!) 144/58  138/73 (!) 140/57  Pulse: 87  82 79  Resp: 18  16 18   Temp: 98.4 F (36.9 C)  98.6 F (37 C) 99.4 F (37.4 C)  TempSrc: Oral  Oral Oral  SpO2: 99%  98% 99%  Weight:  60.1 kg (132 lb 9.6 oz)    Height:  Intake/Output Summary (Last 24 hours) at 12/21/16 1851 Last data filed at 12/21/16 0635  Gross per 24 hour  Intake              310 ml  Output             1800 ml  Net            -1490 ml   Filed Weights   12/17/16 2212 12/21/16 0341  Weight: 55.3 kg (122 lb) 60.1 kg (132 lb 9.6 oz)    Examination:  General exam: calm and comfortable. Malnourished. [name]  Respiratory system: clear to auscultation, no wheezing or rhonchi.  Cardiovascular system: S1 & S2 , RRR, no JVD,  Gastrointestinal system:  Abdomen is soft non tender non distended bowel sounds heard. j tube in place.   Central nervous system: Alert, oriented. No deficits.  Extremities: no cyanosis or clubbing.  Skin: No rashes, lesions or ulcers Psychiatry: Judgement and insight appear normal. Mood & affect appropriate.     Data Reviewed: I have personally reviewed following labs and imaging studies  CBC:  Recent Labs Lab 12/17/16 2204 12/19/16 0449  WBC 8.3 7.7  NEUTROABS 6.1 4.0  HGB 9.4* 8.7*  HCT 28.6* 26.6*  MCV 87.5 87.2  PLT 283 818   Basic Metabolic Panel:  Recent Labs Lab 12/17/16 2204 12/18/16 0513 12/19/16 0449 12/20/16 1115  NA 127* 131* 133*  --   K 4.9 4.4 3.4* 4.0  CL 100* 102 105  --   CO2 19* 22 24  --   GLUCOSE 149* 169* 62*  --   BUN 20 23* 17  --   CREATININE 0.67 0.71 0.75  --   CALCIUM 7.8* 8.1* 8.4*  --   MG  --  1.8 1.9 1.8  PHOS  --  3.8 3.1 3.4   GFR: Estimated Creatinine Clearance: 52.2 mL/min (by C-G formula based on SCr of 0.75 mg/dL). Liver Function Tests:  Recent Labs Lab 12/17/16 2204 12/18/16 0513 12/19/16 0449  AST 27 18 18   ALT 19 15 14   ALKPHOS 101 95 84  BILITOT 1.4* 1.1 0.7  PROT 6.5 6.7 6.4*  ALBUMIN 2.7* 2.6* 2.5*   No results for input(s): LIPASE, AMYLASE in the last 168 hours. No results for input(s): AMMONIA in the last 168 hours. Coagulation Profile:  Recent Labs Lab 12/17/16 2204  INR 1.16   Cardiac Enzymes: No results for input(s): CKTOTAL, CKMB, CKMBINDEX, TROPONINI in the last 168 hours. BNP (last 3 results) No results for input(s): PROBNP in the last 8760 hours. HbA1C: No results for input(s): HGBA1C in the last 72 hours. CBG:  Recent Labs Lab 12/20/16 1232 12/20/16 1705 12/21/16 0825 12/21/16 1249 12/21/16 1715  GLUCAP 153* 153* 122* 154* 152*   Lipid Profile: No results for input(s): CHOL, HDL, LDLCALC, TRIG, CHOLHDL, LDLDIRECT in the last 72 hours. Thyroid Function Tests:  Recent Labs  12/21/16 0643  TSH 1.517     Anemia Panel: No results for input(s): VITAMINB12, FOLATE, FERRITIN, TIBC, IRON, RETICCTPCT in the last 72 hours. Sepsis Labs:  Recent Labs Lab 12/17/16 2232 12/18/16 0513  PROCALCITON  --  0.54  LATICACIDVEN 1.41  --     Recent Results (from the past 240 hour(s))  Urine culture     Status: Abnormal   Collection Time: 12/17/16 11:45 PM  Result Value Ref Range Status   Specimen Description URINE, CLEAN CATCH  Final   Special Requests Normal  Final   Culture (A)  Final    >=100,000 COLONIES/mL STAPHYLOCOCCUS SPECIES (COAGULASE NEGATIVE)   Report Status 12/20/2016 FINAL  Final   Organism ID, Bacteria STAPHYLOCOCCUS SPECIES (COAGULASE NEGATIVE) (A)  Final      Susceptibility   Staphylococcus species (coagulase negative) - MIC*    CIPROFLOXACIN 4 RESISTANT Resistant     GENTAMICIN <=0.5 SENSITIVE Sensitive     NITROFURANTOIN <=16 SENSITIVE Sensitive     OXACILLIN >=4 RESISTANT Resistant     TETRACYCLINE >=16 RESISTANT Resistant     VANCOMYCIN 2 SENSITIVE Sensitive     TRIMETH/SULFA <=10 SENSITIVE Sensitive     CLINDAMYCIN >=8 RESISTANT Resistant     RIFAMPIN <=0.5 SENSITIVE Sensitive     Inducible Clindamycin NEGATIVE Sensitive     * >=100,000 COLONIES/mL STAPHYLOCOCCUS SPECIES (COAGULASE NEGATIVE)  Culture, blood (Routine x 2)     Status: Abnormal (Preliminary result)   Collection Time: 12/18/16 12:00 AM  Result Value Ref Range Status   Specimen Description BLOOD RIGHT HAND RIGHT THUMB  Final   Special Requests   Final    BOTTLES DRAWN AEROBIC AND ANAEROBIC Blood Culture adequate volume   Culture  Setup Time   Final    GRAM POSITIVE COCCI IN CLUSTERS IN BOTH AEROBIC AND ANAEROBIC BOTTLES T.DANG PHARMD 12/19/16 0052 L.CHAMPION    Culture STAPHYLOCOCCUS SPECIES (COAGULASE NEGATIVE) (A)  Final   Report Status PENDING  Incomplete  Blood Culture ID Panel (Reflexed)     Status: Abnormal   Collection Time: 12/18/16 12:00 AM  Result Value Ref Range Status   Enterococcus  species NOT DETECTED NOT DETECTED Final   Vancomycin resistance NOT DETECTED NOT DETECTED Final   Listeria monocytogenes NOT DETECTED NOT DETECTED Final   Staphylococcus species DETECTED (A) NOT DETECTED Final    Comment: CRITICAL RESULT CALLED TO, READ BACK BY AND VERIFIED WITH: T.DANG PHARMD 12/19/16 0052 L.CHAMPION    Staphylococcus aureus NOT DETECTED NOT DETECTED Final   Methicillin resistance DETECTED (A) NOT DETECTED Final    Comment: CRITICAL RESULT CALLED TO, READ BACK BY AND VERIFIED WITH: T.DANG PHARMD 12/19/16 0052 L.CHAMPION    Streptococcus species NOT DETECTED NOT DETECTED Final   Streptococcus agalactiae NOT DETECTED NOT DETECTED Final   Streptococcus pneumoniae NOT DETECTED NOT DETECTED Final   Streptococcus pyogenes NOT DETECTED NOT DETECTED Final   Acinetobacter baumannii NOT DETECTED NOT DETECTED Final   Enterobacteriaceae species NOT DETECTED NOT DETECTED Final   Enterobacter cloacae complex NOT DETECTED NOT DETECTED Final   Escherichia coli NOT DETECTED NOT DETECTED Final   Klebsiella oxytoca NOT DETECTED NOT DETECTED Final   Klebsiella pneumoniae NOT DETECTED NOT DETECTED Final   Proteus species NOT DETECTED NOT DETECTED Final   Serratia marcescens NOT DETECTED NOT DETECTED Final   Carbapenem resistance NOT DETECTED NOT DETECTED Final   Haemophilus influenzae NOT DETECTED NOT DETECTED Final   Neisseria meningitidis NOT DETECTED NOT DETECTED Final   Pseudomonas aeruginosa NOT DETECTED NOT DETECTED Final   Candida albicans NOT DETECTED NOT DETECTED Final   Candida glabrata NOT DETECTED NOT DETECTED Final   Candida krusei NOT DETECTED NOT DETECTED Final   Candida parapsilosis NOT DETECTED NOT DETECTED Final   Candida tropicalis NOT DETECTED NOT DETECTED Final  Culture, blood (Routine x 2)     Status: None (Preliminary result)   Collection Time: 12/18/16 12:25 AM  Result Value Ref Range Status   Specimen Description BLOOD RIGHT HAND  Final   Special Requests IN  PEDIATRIC BOTTLE Blood Culture adequate volume  Final   Culture  NO GROWTH 3 DAYS  Final   Report Status PENDING  Incomplete  Culture, blood (Routine X 2) w Reflex to ID Panel     Status: Abnormal (Preliminary result)   Collection Time: 12/20/16 12:15 AM  Result Value Ref Range Status   Specimen Description BLOOD RIGHT ARM  Final   Special Requests IN PEDIATRIC BOTTLE Blood Culture adequate volume  Final   Culture  Setup Time   Final    GRAM POSITIVE COCCI IN CLUSTERS IN PEDIATRIC BOTTLE CRITICAL RESULT CALLED TO, READ BACK BY AND VERIFIED WITH: L POWELL,PHARMD AT 2044 12/20/16 BY L BENFIELD    Culture (A)  Final    STAPHYLOCOCCUS SPECIES (COAGULASE NEGATIVE) SUSCEPTIBILITIES TO FOLLOW    Report Status PENDING  Incomplete  Culture, blood (Routine X 2) w Reflex to ID Panel     Status: None (Preliminary result)   Collection Time: 12/20/16 12:15 AM  Result Value Ref Range Status   Specimen Description BLOOD RIGHT HAND  Final   Special Requests IN PEDIATRIC BOTTLE Blood Culture adequate volume  Final   Culture NO GROWTH 1 DAY  Final   Report Status PENDING  Incomplete         Radiology Studies: No results found.      Scheduled Meds: . aspirin EC  81 mg Oral Daily  . enoxaparin (LOVENOX) injection  40 mg Subcutaneous Q24H  . feeding supplement (ENSURE ENLIVE)  237 mL Oral TID BM  . felodipine  5 mg Oral Daily  . guaiFENesin  600 mg Oral BID  . insulin aspart  0-10 Units Subcutaneous TID WC  . lipase/protease/amylase  36,000 Units Oral TID WC  . pantoprazole  40 mg Oral BID   Continuous Infusions: . methocarbamol (ROBAXIN)  IV 500 mg (12/21/16 1736)  . vancomycin Stopped (12/20/16 2246)     LOS: 3 days    Time spent: 35 minutes.     Hosie Poisson, MD [facility] Pager [phone] If 7PM-7AM, please contact night-coverage [email or web address] Password [facility] 12/21/2016, 6:51 PM

## 2016-12-22 ENCOUNTER — Inpatient Hospital Stay (HOSPITAL_COMMUNITY): Payer: Medicare Other

## 2016-12-22 DIAGNOSIS — I351 Nonrheumatic aortic (valve) insufficiency: Secondary | ICD-10-CM

## 2016-12-22 DIAGNOSIS — K5900 Constipation, unspecified: Secondary | ICD-10-CM

## 2016-12-22 DIAGNOSIS — I361 Nonrheumatic tricuspid (valve) insufficiency: Secondary | ICD-10-CM

## 2016-12-22 DIAGNOSIS — I340 Nonrheumatic mitral (valve) insufficiency: Secondary | ICD-10-CM

## 2016-12-22 LAB — GLUCOSE, CAPILLARY
GLUCOSE-CAPILLARY: 164 mg/dL — AB (ref 65–99)
Glucose-Capillary: 95 mg/dL (ref 65–99)
Glucose-Capillary: 185 mg/dL — ABNORMAL HIGH (ref 65–99)

## 2016-12-22 LAB — CULTURE, BLOOD (ROUTINE X 2)
SPECIAL REQUESTS: ADEQUATE
Special Requests: ADEQUATE

## 2016-12-22 LAB — ECHOCARDIOGRAM LIMITED
Height: 65 in
Weight: 2118.4 oz

## 2016-12-22 NOTE — Consult Note (Signed)
[facility] Infectious Disease  Date of Admission:  12/17/2016  Date of Consult:  12/22/2016  Reason for Consult: MRSE bacteremia Referring Physician: Karleen Hampshire  Impression/Recommendation MRSE Bacteremia due to [facility] for IV feeding.  MRSE in UCx  Pancreatic CA  RUL opacity on CT  Constipation (no BM since adm)  Would Remove PIC til BCx negative Repeat BCx Continue vanco for 14 days from negative BCx Defer TEE.   Comment- I explained to her and her son that she needs 2 weeks of vanco and that this can be accomplished through another [facility] line at home. As well, I am not sure what her home nutritional needs are. They wish to go home.  Upon trying to explain to her son that she needs another [facility] line, he got out of his chair, and exclaimed "this doesn't make any damn sense! I want another doctor in here, I want a second opinion!"  I left the room immediately without further comment.   I'm available for phone questions as needed.    Thank you so much for this interesting consult,   Bobby Rumpf (pager) [phone] [email or web address]  Rhonda Spencer is an 76 y.o. female.  HPI: 76 yo F with hx of adenoCA of pancreas, pancreaticoduodenectomy (8-9 to 8-30), DM2 adm on 9-12 with fever (102 at SNF).  In ED she was found to have RLL infiltrate on CXR and was dx as HCAP. She was started on vanco/zosyn.  Her BCx grew 1/2 MRSE as did her urine. Her PIC was removed.  She had f/u BCx which also grew 1/2 MRSE. She was started on vanco on 9-16.   Past Medical History:  Diagnosis Date  . AI (aortic insufficiency)   . Anemia   . History of nuclear stress test 11/2008   bruce myoview; normal pattern of perfusion in allregions, no significant wall motion abnormalities; no ECG changes, no significant ischemia demonstrated, low risk scan   . Hypertension   . MR (mitral regurgitation)   . Rheumatic fever   . Rheumatic heart disease   . Rheumatoid arthritis ([place])   . Sinus  bradycardia     Past Surgical History:  Procedure Laterality Date  . ABDOMINAL HYSTERECTOMY    . CHOLECYSTECTOMY    . COLON SURGERY    . ERCP N/A 10/03/2016   Procedure: ENDOSCOPIC RETROGRADE CHOLANGIOPANCREATOGRAPHY (ERCP);  Surgeon: Carol Ada, MD;  Location: [facility];  Service: Endoscopy;  Laterality: N/A;  . EXCISIONAL HEMORRHOIDECTOMY    . HEMI-MICRODISCECTOMY LUMBAR LAMINECTOMY LEVEL 4  1995  . JEJUNOSTOMY N/A 11/13/2016   Procedure: PLACEMENT JEJUNOSTOMY TUBE;  Surgeon: [name], MD;  Location: [facility];  Service: General;  Laterality: N/A;  . LAPAROSCOPY N/A 11/13/2016   Procedure: ATTEMPTED DAGNOSTIC LAPAROSCOPY;  Surgeon: [name], MD;  Location: [facility];  Service: General;  Laterality: N/A;  . TONSILLECTOMY    . TRANSTHORACIC ECHOCARDIOGRAM  11/2011   EF=>55%; LA mild-mod dilated; mod-severe MR; mild-mod TR with normal systolic pressure; mild-mod AV regurg; mild pulm valve regurg   . UPPER ESOPHAGEAL ENDOSCOPIC ULTRASOUND (EUS) N/A 09/26/2016   Procedure: UPPER ESOPHAGEAL ENDOSCOPIC ULTRASOUND (EUS);  Surgeon: Carol Ada, MD;  Location: [facility];  Service: Endoscopy;  Laterality: N/A;  . UPPER ESOPHAGEAL ENDOSCOPIC ULTRASOUND (EUS) N/A 10/03/2016   Procedure: UPPER ESOPHAGEAL ENDOSCOPIC ULTRASOUND (EUS);  Surgeon: Carol Ada, MD;  Location: [facility];  Service: Endoscopy;  Laterality: N/A;  . WHIPPLE PROCEDURE N/A 11/13/2016   Procedure: WHIPPLE PROCEDURE;  Surgeon: Barry Dienes,  Dorris Fetch, MD;  Location: [facility];  Service: General;  Laterality: N/A;     Allergies  Allergen Reactions  . Tarka [Trandolapril-Verapamil Hcl Er] Swelling    Slowed heart rate Pt reports allergy to any cardiac medication ending in "-il"  . Atorvastatin Other (See Comments)    Stated that it lowered her heart rate  . Contrast Media [Iodinated Diagnostic Agents] Itching    Medications:  Scheduled: . aspirin EC  81 mg Oral Daily  . enoxaparin (LOVENOX) injection  40 mg Subcutaneous Q24H    . feeding supplement (ENSURE ENLIVE)  237 mL Oral TID BM  . felodipine  5 mg Oral Daily  . guaiFENesin  600 mg Oral BID  . insulin aspart  0-10 Units Subcutaneous TID WC  . lipase/protease/amylase  36,000 Units Oral TID WC  . pantoprazole  40 mg Oral BID    Abtx:  Anti-infectives    Start     Dose/Rate Route Frequency Ordered Stop   12/20/16 2200  vancomycin (VANCOCIN) IVPB 1000 mg/200 mL premix     1,000 mg 200 mL/hr over 60 Minutes Intravenous Every 24 hours 12/20/16 2100     12/20/16 1500  sulfamethoxazole-trimethoprim (BACTRIM DS,SEPTRA DS) 800-160 MG per tablet 1 tablet  Status:  Discontinued     1 tablet Oral Every 12 hours 12/20/16 1431 12/20/16 2102   12/18/16 2300  vancomycin (VANCOCIN) IVPB 1000 mg/200 mL premix  Status:  Discontinued     1,000 mg 200 mL/hr over 60 Minutes Intravenous Every 24 hours 12/17/16 2306 12/20/16 1421   12/18/16 0800  piperacillin-tazobactam (ZOSYN) IVPB 3.375 g  Status:  Discontinued     3.375 g 12.5 mL/hr over 240 Minutes Intravenous Every 8 hours 12/17/16 2306 12/20/16 1421   12/17/16 2300  piperacillin-tazobactam (ZOSYN) IVPB 3.375 g     3.375 g 100 mL/hr over 30 Minutes Intravenous  Once 12/17/16 2257 12/18/16 0115   12/17/16 2300  vancomycin (VANCOCIN) IVPB 1000 mg/200 mL premix     1,000 mg 200 mL/hr over 60 Minutes Intravenous  Once 12/17/16 2257 12/18/16 0146      Total days of antibiotics: 2 vanco          Social History:  reports that she quit smoking about 49 years ago. She quit after 8.00 years of use. She has never used smokeless tobacco. She reports that she does not drink alcohol or use drugs.  Family History  Problem Relation Age of Onset  . Hypertension Mother   . Stroke Mother   . Heart disease Mother   . Pneumonia Father   . Bone cancer Maternal Uncle   . Diabetes Sister        x2; one was step-sister  . Tuberculosis Sister        step-sister  . Diabetes Son   . Diabetes Daughter     History obtained from  chart review and the patient General ROS: no dysphagia, no fever, no abd pain, wound well healed, no problems with prev PIC, no BM in 4 days, denies dysuria,  Please see HPI. 12 point ROS o/w (-)  Blood pressure 138/64, pulse 83, temperature 98.8 F (37.1 C), resp. rate 16, height 5\' 5"  (1.651 m), weight 60.1 kg (132 lb 6.4 oz), SpO2 100 %. General appearance: alert, cooperative and no distress Eyes: negative findings: conjunctivae and sclerae normal and pupils equal, round, reactive to light and accomodation Throat: normal findings: oropharynx pink & moist without lesions or evidence of thrush and abnormal findings:  dentition: poor Neck: no adenopathy and supple, symmetrical, trachea midline Lungs: clear to auscultation bilaterally Heart: regular rate and rhythm Abdomen: normal findings: bowel sounds normal and soft, non-tender and LUQ drain in place.  Extremities: edema none and RUE prev pic site is clean. no cordis, no d/c.    Results for orders placed or performed during the hospital encounter of 12/17/16 (from the past 48 hour(s))  Glucose, capillary     Status: Abnormal   Collection Time: 12/20/16  5:05 PM  Result Value Ref Range   Glucose-Capillary 153 (H) 65 - 99 mg/dL  TSH     Status: None   Collection Time: 12/21/16  6:43 AM  Result Value Ref Range   TSH 1.517 0.350 - 4.500 uIU/mL    Comment: Performed by a 3rd Generation assay with a functional sensitivity of <=0.01 uIU/mL.  Glucose, capillary     Status: Abnormal   Collection Time: 12/21/16  8:25 AM  Result Value Ref Range   Glucose-Capillary 122 (H) 65 - 99 mg/dL  Glucose, capillary     Status: Abnormal   Collection Time: 12/21/16 12:49 PM  Result Value Ref Range   Glucose-Capillary 154 (H) 65 - 99 mg/dL  Glucose, capillary     Status: Abnormal   Collection Time: 12/21/16  5:15 PM  Result Value Ref Range   Glucose-Capillary 152 (H) 65 - 99 mg/dL  Glucose, capillary     Status: None   Collection Time: 12/22/16  8:13  AM  Result Value Ref Range   Glucose-Capillary 95 65 - 99 mg/dL  Glucose, capillary     Status: Abnormal   Collection Time: 12/22/16  1:04 PM  Result Value Ref Range   Glucose-Capillary 164 (H) 65 - 99 mg/dL      Component Value Date/Time   SDES BLOOD RIGHT ARM 12/20/2016 0015   SDES BLOOD RIGHT HAND 12/20/2016 0015   SPECREQUEST IN PEDIATRIC BOTTLE Blood Culture adequate volume 12/20/2016 0015   SPECREQUEST IN PEDIATRIC BOTTLE Blood Culture adequate volume 12/20/2016 0015   CULT STAPHYLOCOCCUS EPIDERMIDIS (A) 12/20/2016 0015   CULT NO GROWTH 2 DAYS 12/20/2016 0015   REPTSTATUS 12/22/2016 FINAL 12/20/2016 0015   REPTSTATUS PENDING 12/20/2016 0015   No results found. Recent Results (from the past 240 hour(s))  Urine culture     Status: Abnormal   Collection Time: 12/17/16 11:45 PM  Result Value Ref Range Status   Specimen Description URINE, CLEAN CATCH  Final   Special Requests Normal  Final   Culture (A)  Final    >=100,000 COLONIES/mL STAPHYLOCOCCUS SPECIES (COAGULASE NEGATIVE)   Report Status 12/20/2016 FINAL  Final   Organism ID, Bacteria STAPHYLOCOCCUS SPECIES (COAGULASE NEGATIVE) (A)  Final      Susceptibility   Staphylococcus species (coagulase negative) - MIC*    CIPROFLOXACIN 4 RESISTANT Resistant     GENTAMICIN <=0.5 SENSITIVE Sensitive     NITROFURANTOIN <=16 SENSITIVE Sensitive     OXACILLIN >=4 RESISTANT Resistant     TETRACYCLINE >=16 RESISTANT Resistant     VANCOMYCIN 2 SENSITIVE Sensitive     TRIMETH/SULFA <=10 SENSITIVE Sensitive     CLINDAMYCIN >=8 RESISTANT Resistant     RIFAMPIN <=0.5 SENSITIVE Sensitive     Inducible Clindamycin NEGATIVE Sensitive     * >=100,000 COLONIES/mL STAPHYLOCOCCUS SPECIES (COAGULASE NEGATIVE)  Culture, blood (Routine x 2)     Status: Abnormal   Collection Time: 12/18/16 12:00 AM  Result Value Ref Range Status   Specimen Description BLOOD RIGHT HAND RIGHT  THUMB  Final   Special Requests   Final    BOTTLES DRAWN AEROBIC AND  ANAEROBIC Blood Culture adequate volume   Culture  Setup Time   Final    GRAM POSITIVE COCCI IN CLUSTERS IN BOTH AEROBIC AND ANAEROBIC BOTTLES T.DANG PHARMD 12/19/16 0052 L.CHAMPION    Culture (A)  Final    STAPHYLOCOCCUS EPIDERMIDIS SUSCEPTIBILITIES PERFORMED ON PREVIOUS CULTURE WITHIN THE LAST 5 DAYS.    Report Status 12/22/2016 FINAL  Final  Blood Culture ID Panel (Reflexed)     Status: Abnormal   Collection Time: 12/18/16 12:00 AM  Result Value Ref Range Status   Enterococcus species NOT DETECTED NOT DETECTED Final   Vancomycin resistance NOT DETECTED NOT DETECTED Final   Listeria monocytogenes NOT DETECTED NOT DETECTED Final   Staphylococcus species DETECTED (A) NOT DETECTED Final    Comment: CRITICAL RESULT CALLED TO, READ BACK BY AND VERIFIED WITH: T.DANG PHARMD 12/19/16 0052 L.CHAMPION    Staphylococcus aureus NOT DETECTED NOT DETECTED Final   Methicillin resistance DETECTED (A) NOT DETECTED Final    Comment: CRITICAL RESULT CALLED TO, READ BACK BY AND VERIFIED WITH: T.DANG PHARMD 12/19/16 0052 L.CHAMPION    Streptococcus species NOT DETECTED NOT DETECTED Final   Streptococcus agalactiae NOT DETECTED NOT DETECTED Final   Streptococcus pneumoniae NOT DETECTED NOT DETECTED Final   Streptococcus pyogenes NOT DETECTED NOT DETECTED Final   Acinetobacter baumannii NOT DETECTED NOT DETECTED Final   Enterobacteriaceae species NOT DETECTED NOT DETECTED Final   Enterobacter cloacae complex NOT DETECTED NOT DETECTED Final   Escherichia coli NOT DETECTED NOT DETECTED Final   Klebsiella oxytoca NOT DETECTED NOT DETECTED Final   Klebsiella pneumoniae NOT DETECTED NOT DETECTED Final   Proteus species NOT DETECTED NOT DETECTED Final   Serratia marcescens NOT DETECTED NOT DETECTED Final   Carbapenem resistance NOT DETECTED NOT DETECTED Final   Haemophilus influenzae NOT DETECTED NOT DETECTED Final   Neisseria meningitidis NOT DETECTED NOT DETECTED Final   Pseudomonas aeruginosa NOT  DETECTED NOT DETECTED Final   Candida albicans NOT DETECTED NOT DETECTED Final   Candida glabrata NOT DETECTED NOT DETECTED Final   Candida krusei NOT DETECTED NOT DETECTED Final   Candida parapsilosis NOT DETECTED NOT DETECTED Final   Candida tropicalis NOT DETECTED NOT DETECTED Final  Culture, blood (Routine x 2)     Status: None (Preliminary result)   Collection Time: 12/18/16 12:25 AM  Result Value Ref Range Status   Specimen Description BLOOD RIGHT HAND  Final   Special Requests IN PEDIATRIC BOTTLE Blood Culture adequate volume  Final   Culture NO GROWTH 4 DAYS  Final   Report Status PENDING  Incomplete  Culture, blood (Routine X 2) w Reflex to ID Panel     Status: Abnormal   Collection Time: 12/20/16 12:15 AM  Result Value Ref Range Status   Specimen Description BLOOD RIGHT ARM  Final   Special Requests IN PEDIATRIC BOTTLE Blood Culture adequate volume  Final   Culture  Setup Time   Final    GRAM POSITIVE COCCI IN CLUSTERS IN PEDIATRIC BOTTLE CRITICAL RESULT CALLED TO, READ BACK BY AND VERIFIED WITH: L POWELL,PHARMD AT 2044 12/20/16 BY L BENFIELD    Culture STAPHYLOCOCCUS EPIDERMIDIS (A)  Final   Report Status 12/22/2016 FINAL  Final   Organism ID, Bacteria STAPHYLOCOCCUS EPIDERMIDIS  Final      Susceptibility   Staphylococcus epidermidis - MIC*    CIPROFLOXACIN 4 RESISTANT Resistant     ERYTHROMYCIN >=8 RESISTANT Resistant  GENTAMICIN <=0.5 SENSITIVE Sensitive     OXACILLIN >=4 RESISTANT Resistant     TETRACYCLINE >=16 RESISTANT Resistant     VANCOMYCIN 2 SENSITIVE Sensitive     TRIMETH/SULFA <=10 SENSITIVE Sensitive     CLINDAMYCIN >=8 RESISTANT Resistant     RIFAMPIN <=0.5 SENSITIVE Sensitive     Inducible Clindamycin NEGATIVE Sensitive     * STAPHYLOCOCCUS EPIDERMIDIS  Culture, blood (Routine X 2) w Reflex to ID Panel     Status: None (Preliminary result)   Collection Time: 12/20/16 12:15 AM  Result Value Ref Range Status   Specimen Description BLOOD RIGHT HAND   Final   Special Requests IN PEDIATRIC BOTTLE Blood Culture adequate volume  Final   Culture NO GROWTH 2 DAYS  Final   Report Status PENDING  Incomplete      12/22/2016, 3:32 PM     LOS: 4 days    Records and images were personally reviewed where available.  Bobby Rumpf, MD [facility] [phone] 12/22/2016, 3:32 PM

## 2016-12-22 NOTE — Progress Notes (Addendum)
Nutrition Follow-up  DOCUMENTATION CODES:   Severe malnutrition in context of chronic illness  INTERVENTION:  Continue Ensure Enlive po TID, each supplement provides 350 kcal and 20 grams of protein.  Discontinue calorie count.   Recommend Ensure Enlive po TID once discharged home to aid in adequate nutrition.   NUTRITION DIAGNOSIS:   Malnutrition (Severe) related to chronic illness (pancreatic cancer s/p whipple) as evidenced by mild depletion of body fat, moderate depletion of body fat, moderate depletions of muscle mass, severe depletion of muscle mass, percent weight loss; ongoing  GOAL:   Patient will meet greater than or equal to 90% of their needs; met  MONITOR:   PO intake, Supplement acceptance, Labs, Weight trends, Skin, I & O's  REASON FOR ASSESSMENT:   Malnutrition Screening Tool, Consult New TPN/TNA  ASSESSMENT:   Rhonda Spencer is a 76 y.o. female with medical history significant of  adenocarcinoma of the pancreas s/p pancreaticoduodenectomy, diastolic CHF, DM type 2; who presents with complains of fever for the last 2 days. Fever went up to 102F at the nursing facility.  TPN discontinue 9/14.   RD to discontinue calorie count order as insufficient meal tickets/meal consumption information provided. RD unable to calculate calorie count results. Pt reports appetite has improved and she is starting to eat better. Pt reports no abdominal pains. Pt reports eating at least 50% of meals each time. Pt with 90% intake this AM for breakfast. Pt currently has Ensure ordered and has been consuming them. RD to continue with current orders. Pt reports plans for discharge today. She reports her Son has already gone out to purchase Ensure shakes for her post discharge. Pt educated to consume at least 2-3 shakes a day to aid in adequate nutrition. Pt expressed understanding. Labs and medications reviewed.   Diet Order:  DIET SOFT Room service appropriate? Yes; Fluid consistency:  Thin  Skin:   (Incision on abdomen)  Last BM:  9/12  Height:   Ht Readings from Last 1 Encounters:  12/17/16 5' 5"  (1.651 m)    Weight:   Wt Readings from Last 1 Encounters:  12/22/16 132 lb 6.4 oz (60.1 kg)    Ideal Body Weight:  56.8 kg  BMI:  Body mass index is 22.03 kg/m.  Estimated Nutritional Needs:   Kcal:  1700-1900  Protein:  95-110 grams  Fluid:  1.7-1.9 L  EDUCATION NEEDS:   Education needs addressed  Corrin Parker, MS, RD, LDN Pager # [phone] After hours/ weekend pager # [phone]

## 2016-12-22 NOTE — Progress Notes (Signed)
PROGRESS NOTE    Rhonda Spencer  JHE:[id number] DOB: 1940/10/21 DOA: 12/17/2016 PCP: Iona Beard, MD    Brief Narrative:  Rhonda Spencer is a 76 y.o. female with medical history significant of  adenocarcinoma of the pancreas s/p pancreaticoduodenectomy, diastolic CHF, DM type 2; who presents with complains of fever for the last 2 days. She was found to have hcap and uti and coag neg staph in one set of blood cultures, possibly a contaminant. Discontinue the PICC line.  Repeat blood cultures ordered, one set showed coag neg staph , unclear if its the same species as the previous one.   Assessment & Plan:   Principal Problem:   Sepsis ([place]) Active Problems:   HTN (hypertension)   Essential hypertension   Adenocarcinoma of head of pancreas ([place])   HCAP (healthcare-associated pneumonia)   DM type 2 (diabetes mellitus, type 2) ([place])   Protein-calorie malnutrition, severe   Sepsis possibly from health care associated pneumonia and a urinary tract infection: CT angio of the chest ruled out pulmonary embolism.  Urine cultures grew coag neg staph sensitive to bactrim.  Blood cultures on 2 separate times grew coag neg staph epidermididis. ID consulted and recommendations given.  Echocardiogram ordered.  duonebs prn.  Removed the PICC used for TPN, . Will await for blood cultures to be negative before inserting a new PICC line.   Patient's son was visibly upset when talked about getting another PICC, discussed in detail the need for the PICC, risks and benefits , and he agreed to it.    Hypertension: well controlled.    Diabetes mellitus:  CBG (last 3)   Recent Labs  12/21/16 1715 12/22/16 0813 12/22/16 1304  GLUCAP 152* 95 164*    Resume SSI, her cbgs are improving.  We have discontinued her leveimir.  hgba1c is pending.   Diastolic heart failure:  It appears compensated.  Daily weights, strict intake and output.    Adenocarcinoma of the pancreas s/p  pancreaticoduodenectomy:  Resume pancreas enzymes.  She underwent J tube placement, but couldn't tolerate tube feeds.  PICC line was placed and TPN was started.  She has been on TPN for almost a month now.  But she is able to tolerate soft diet. Will d/c TPN and PICC line.  Discussed the plan with Dr Barry Dienes.    Severe protein energy malnutrition:  Dietary consulted.    Normocytic anemia:  Anemia of chronic disease.  Hemoglobin stable.   Back spasms; unclear etiology.magnesium replaced.  tsh wnl.  Calcium slghtly low.  She reports persistent back pain, even with pain meds, in view of her recent pancretic cancer will get CT lumbar spine and thoracic spine.   Hyponatremia:  Improved.   Abnormal CT ; New reticulonodular opacity in the right upper lobe is likely infectious or inflammatory. Small subpleural 8 x 4 mm nodular opacity in the right upper lobe was also new, nonspecific. Recommend short-term follow-up CT in 3 months to evaluate for resolution or Change.     DVT prophylaxis: (Lovenox/) Code Status: (Full) Family Communication: none at bedside.  Disposition Plan: home tomorrow.   Consultants:   None.   Procedures: none.  Antimicrobials: vancomycin and zosyn since admission.   Subjective: Back pain worsened.  Objective: Vitals:   12/21/16 1523 12/21/16 2055 12/22/16 0436 12/22/16 1529  BP: (!) 140/57 (!) 147/70 (!) 149/64 138/64  Pulse: 79 95 88 83  Resp: 18 18 18 16   Temp: 99.4 F (37.4 C) (!) 101.1 F (  38.4 C) 98.5 F (36.9 C) 98.8 F (37.1 C)  TempSrc: Oral Oral Oral   SpO2: 99% 98% 100% 100%  Weight:   60.1 kg (132 lb 6.4 oz)   Height:        Intake/Output Summary (Last 24 hours) at 12/22/16 1634 Last data filed at 12/22/16 0551  Gross per 24 hour  Intake                0 ml  Output              600 ml  Net             -600 ml   Filed Weights   12/17/16 2212 12/21/16 0341 12/22/16 0436  Weight: 55.3 kg (122 lb) 60.1 kg (132 lb 9.6 oz)  60.1 kg (132 lb 6.4 oz)    Examination:  General exam: calm and comfortable. Malnourished. In pain   Respiratory system: clear to auscultation, no wheezing or rhonchi. Air entry fair.  Cardiovascular system: S1 & S2 , RRR, no JVD, no murmer.  Gastrointestinal system: Abdomen is soft NT ND BS+ j tube in place.  Central nervous system: Alert, oriented. No deficits.  Extremities: no cyanosis or clubbing.  Skin: No rashes, lesions or ulcers Psychiatry: Judgement and insight appear normal. Mood & affect appropriate.  Back:  Tenderness in the paraspinal area.     Data Reviewed: I have personally reviewed following labs and imaging studies  CBC:  Recent Labs Lab 12/17/16 2204 12/19/16 0449  WBC 8.3 7.7  NEUTROABS 6.1 4.0  HGB 9.4* 8.7*  HCT 28.6* 26.6*  MCV 87.5 87.2  PLT 283 824   Basic Metabolic Panel:  Recent Labs Lab 12/17/16 2204 12/18/16 0513 12/19/16 0449 12/20/16 1115  NA 127* 131* 133*  --   K 4.9 4.4 3.4* 4.0  CL 100* 102 105  --   CO2 19* 22 24  --   GLUCOSE 149* 169* 62*  --   BUN 20 23* 17  --   CREATININE 0.67 0.71 0.75  --   CALCIUM 7.8* 8.1* 8.4*  --   MG  --  1.8 1.9 1.8  PHOS  --  3.8 3.1 3.4   GFR: Estimated Creatinine Clearance: 52.2 mL/min (by C-G formula based on SCr of 0.75 mg/dL). Liver Function Tests:  Recent Labs Lab 12/17/16 2204 12/18/16 0513 12/19/16 0449  AST 27 18 18   ALT 19 15 14   ALKPHOS 101 95 84  BILITOT 1.4* 1.1 0.7  PROT 6.5 6.7 6.4*  ALBUMIN 2.7* 2.6* 2.5*   No results for input(s): LIPASE, AMYLASE in the last 168 hours. No results for input(s): AMMONIA in the last 168 hours. Coagulation Profile:  Recent Labs Lab 12/17/16 2204  INR 1.16   Cardiac Enzymes: No results for input(s): CKTOTAL, CKMB, CKMBINDEX, TROPONINI in the last 168 hours. BNP (last 3 results) No results for input(s): PROBNP in the last 8760 hours. HbA1C: No results for input(s): HGBA1C in the last 72 hours. CBG:  Recent Labs Lab  12/21/16 0825 12/21/16 1249 12/21/16 1715 12/22/16 0813 12/22/16 1304  GLUCAP 122* 154* 152* 95 164*   Lipid Profile: No results for input(s): CHOL, HDL, LDLCALC, TRIG, CHOLHDL, LDLDIRECT in the last 72 hours. Thyroid Function Tests:  Recent Labs  12/21/16 0643  TSH 1.517   Anemia Panel: No results for input(s): VITAMINB12, FOLATE, FERRITIN, TIBC, IRON, RETICCTPCT in the last 72 hours. Sepsis Labs:  Recent Labs Lab 12/17/16 2232 12/18/16 [place]  --  0.54  LATICACIDVEN 1.41  --     Recent Results (from the past 240 hour(s))  Urine culture     Status: Abnormal   Collection Time: 12/17/16 11:45 PM  Result Value Ref Range Status   Specimen Description URINE, CLEAN CATCH  Final   Special Requests Normal  Final   Culture (A)  Final    >=100,000 COLONIES/mL STAPHYLOCOCCUS SPECIES (COAGULASE NEGATIVE)   Report Status 12/20/2016 FINAL  Final   Organism ID, Bacteria STAPHYLOCOCCUS SPECIES (COAGULASE NEGATIVE) (A)  Final      Susceptibility   Staphylococcus species (coagulase negative) - MIC*    CIPROFLOXACIN 4 RESISTANT Resistant     GENTAMICIN <=0.5 SENSITIVE Sensitive     NITROFURANTOIN <=16 SENSITIVE Sensitive     OXACILLIN >=4 RESISTANT Resistant     TETRACYCLINE >=16 RESISTANT Resistant     VANCOMYCIN 2 SENSITIVE Sensitive     TRIMETH/SULFA <=10 SENSITIVE Sensitive     CLINDAMYCIN >=8 RESISTANT Resistant     RIFAMPIN <=0.5 SENSITIVE Sensitive     Inducible Clindamycin NEGATIVE Sensitive     * >=100,000 COLONIES/mL STAPHYLOCOCCUS SPECIES (COAGULASE NEGATIVE)  Culture, blood (Routine x 2)     Status: Abnormal   Collection Time: 12/18/16 12:00 AM  Result Value Ref Range Status   Specimen Description BLOOD RIGHT HAND RIGHT THUMB  Final   Special Requests   Final    BOTTLES DRAWN AEROBIC AND ANAEROBIC Blood Culture adequate volume   Culture  Setup Time   Final    GRAM POSITIVE COCCI IN CLUSTERS IN BOTH AEROBIC AND ANAEROBIC BOTTLES T.DANG PHARMD 12/19/16  0052 L.CHAMPION    Culture (A)  Final    STAPHYLOCOCCUS EPIDERMIDIS SUSCEPTIBILITIES PERFORMED ON PREVIOUS CULTURE WITHIN THE LAST 5 DAYS.    Report Status 12/22/2016 FINAL  Final  Blood Culture ID Panel (Reflexed)     Status: Abnormal   Collection Time: 12/18/16 12:00 AM  Result Value Ref Range Status   Enterococcus species NOT DETECTED NOT DETECTED Final   Vancomycin resistance NOT DETECTED NOT DETECTED Final   Listeria monocytogenes NOT DETECTED NOT DETECTED Final   Staphylococcus species DETECTED (A) NOT DETECTED Final    Comment: CRITICAL RESULT CALLED TO, READ BACK BY AND VERIFIED WITH: T.DANG PHARMD 12/19/16 0052 L.CHAMPION    Staphylococcus aureus NOT DETECTED NOT DETECTED Final   Methicillin resistance DETECTED (A) NOT DETECTED Final    Comment: CRITICAL RESULT CALLED TO, READ BACK BY AND VERIFIED WITH: T.DANG PHARMD 12/19/16 0052 L.CHAMPION    Streptococcus species NOT DETECTED NOT DETECTED Final   Streptococcus agalactiae NOT DETECTED NOT DETECTED Final   Streptococcus pneumoniae NOT DETECTED NOT DETECTED Final   Streptococcus pyogenes NOT DETECTED NOT DETECTED Final   Acinetobacter baumannii NOT DETECTED NOT DETECTED Final   Enterobacteriaceae species NOT DETECTED NOT DETECTED Final   Enterobacter cloacae complex NOT DETECTED NOT DETECTED Final   Escherichia coli NOT DETECTED NOT DETECTED Final   Klebsiella oxytoca NOT DETECTED NOT DETECTED Final   Klebsiella pneumoniae NOT DETECTED NOT DETECTED Final   Proteus species NOT DETECTED NOT DETECTED Final   Serratia marcescens NOT DETECTED NOT DETECTED Final   Carbapenem resistance NOT DETECTED NOT DETECTED Final   Haemophilus influenzae NOT DETECTED NOT DETECTED Final   Neisseria meningitidis NOT DETECTED NOT DETECTED Final   Pseudomonas aeruginosa NOT DETECTED NOT DETECTED Final   Candida albicans NOT DETECTED NOT DETECTED Final   Candida glabrata NOT DETECTED NOT DETECTED Final   Candida krusei NOT DETECTED NOT  DETECTED  Final   Candida parapsilosis NOT DETECTED NOT DETECTED Final   Candida tropicalis NOT DETECTED NOT DETECTED Final  Culture, blood (Routine x 2)     Status: None (Preliminary result)   Collection Time: 12/18/16 12:25 AM  Result Value Ref Range Status   Specimen Description BLOOD RIGHT HAND  Final   Special Requests IN PEDIATRIC BOTTLE Blood Culture adequate volume  Final   Culture NO GROWTH 4 DAYS  Final   Report Status PENDING  Incomplete  Culture, blood (Routine X 2) w Reflex to ID Panel     Status: Abnormal   Collection Time: 12/20/16 12:15 AM  Result Value Ref Range Status   Specimen Description BLOOD RIGHT ARM  Final   Special Requests IN PEDIATRIC BOTTLE Blood Culture adequate volume  Final   Culture  Setup Time   Final    GRAM POSITIVE COCCI IN CLUSTERS IN PEDIATRIC BOTTLE CRITICAL RESULT CALLED TO, READ BACK BY AND VERIFIED WITH: L POWELL,PHARMD AT 2044 12/20/16 BY L BENFIELD    Culture STAPHYLOCOCCUS EPIDERMIDIS (A)  Final   Report Status 12/22/2016 FINAL  Final   Organism ID, Bacteria STAPHYLOCOCCUS EPIDERMIDIS  Final      Susceptibility   Staphylococcus epidermidis - MIC*    CIPROFLOXACIN 4 RESISTANT Resistant     ERYTHROMYCIN >=8 RESISTANT Resistant     GENTAMICIN <=0.5 SENSITIVE Sensitive     OXACILLIN >=4 RESISTANT Resistant     TETRACYCLINE >=16 RESISTANT Resistant     VANCOMYCIN 2 SENSITIVE Sensitive     TRIMETH/SULFA <=10 SENSITIVE Sensitive     CLINDAMYCIN >=8 RESISTANT Resistant     RIFAMPIN <=0.5 SENSITIVE Sensitive     Inducible Clindamycin NEGATIVE Sensitive     * STAPHYLOCOCCUS EPIDERMIDIS  Culture, blood (Routine X 2) w Reflex to ID Panel     Status: None (Preliminary result)   Collection Time: 12/20/16 12:15 AM  Result Value Ref Range Status   Specimen Description BLOOD RIGHT HAND  Final   Special Requests IN PEDIATRIC BOTTLE Blood Culture adequate volume  Final   Culture NO GROWTH 2 DAYS  Final   Report Status PENDING  Incomplete          Radiology Studies: No results found.      Scheduled Meds: . aspirin EC  81 mg Oral Daily  . enoxaparin (LOVENOX) injection  40 mg Subcutaneous Q24H  . feeding supplement (ENSURE ENLIVE)  237 mL Oral TID BM  . felodipine  5 mg Oral Daily  . guaiFENesin  600 mg Oral BID  . insulin aspart  0-10 Units Subcutaneous TID WC  . lipase/protease/amylase  36,000 Units Oral TID WC  . pantoprazole  40 mg Oral BID   Continuous Infusions: . methocarbamol (ROBAXIN)  IV 500 mg (12/21/16 1736)  . vancomycin Stopped (12/21/16 2302)     LOS: 4 days    Time spent: 35 minutes.     Hosie Poisson, MD [facility] Pager [phone] If 7PM-7AM, please contact night-coverage [email or web address] Password [facility] 12/22/2016, 4:34 PM

## 2016-12-22 NOTE — Progress Notes (Signed)
  Echocardiogram 2D Echocardiogram has been performed.  Rhonda Spencer 12/22/2016, 2:09 PM

## 2016-12-22 NOTE — Care Management Important Message (Signed)
Important Message  Patient Details  Name: Rhonda Spencer MRN: [id number] Date of Birth: Jul 02, 1940   Medicare Important Message Given:  Yes    Nathen May 12/22/2016, 11:22 AM

## 2016-12-22 NOTE — Consult Note (Signed)
   [facility] CM Inpatient Consult   12/22/2016  Rhonda Spencer October 18, 1940 [id number]  Patient was evaluated for community based chronic disease management services with [facility] as a benefit of patient's Medicare Insurance on Friday December 19, 2016.[name] Spoke with patient and her daughter, Rhonda Spencer at [phone].  at bedside to explain [facility] services. Patient interested in [facility] for post hospital follow up. Patient endorses Dr. Iona Beard as her primary care provider. Consent received for [facility] services she endorses her daughter Rhonda Spencer and her son, Rhonda Spencer [phone] as her primary contacts.  Patient will receive post hospital discharge call and will be evaluated for monthly home visits for assessments and disease process education.  Left contact information and [facility] literature at bedside. Went by to see patient who is currently off the unit.  Made Inpatient Case Manager aware that [place] following. Of note, [facility] Management services does not replace or interfere with any services that are arranged by inpatient case management or social work.  For additional questions or referrals please contact:     Natividad Brood, RN BSN [facility] Liaison  [phone] business mobile phone Toll free office [phone]

## 2016-12-23 LAB — GLUCOSE, CAPILLARY
GLUCOSE-CAPILLARY: 157 mg/dL — AB (ref 65–99)
GLUCOSE-CAPILLARY: 154 mg/dL — AB (ref 65–99)
Glucose-Capillary: 147 mg/dL — ABNORMAL HIGH (ref 65–99)
Glucose-Capillary: 136 mg/dL — ABNORMAL HIGH (ref 65–99)

## 2016-12-23 LAB — CULTURE, BLOOD (ROUTINE X 2)
Culture: NO GROWTH
SPECIAL REQUESTS: ADEQUATE

## 2016-12-23 LAB — HEMOGLOBIN A1C
Hgb A1c MFr Bld: 7.1 % — ABNORMAL HIGH (ref 4.8–5.6)
Mean Plasma Glucose: 157.07 mg/dL

## 2016-12-23 LAB — VANCOMYCIN, TROUGH: Vancomycin Tr: 9 ug/mL — ABNORMAL LOW (ref 15–20)

## 2016-12-23 MED ORDER — VANCOMYCIN HCL IN DEXTROSE 750-5 MG/150ML-% IV SOLN
750.0000 mg | Freq: Two times a day (BID) | INTRAVENOUS | Status: DC
  Administered 2016-12-24 – 2016-12-25 (×3): 750 mg via INTRAVENOUS
  Filled 2016-12-23 (×4): qty 150

## 2016-12-23 NOTE — Progress Notes (Signed)
Pharmacy Antibiotic Note  Rhonda Spencer is a 76 y.o. female admitted on 12/17/2016 with fever and back pain.  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  Pharmacy has been consulted for vancomycin and Zosyn dosing for sepsis.   The patient's Vancomycin trough this evening is SUBtherapeutic (VT 9 mcg/ml, goal of 15-20 mcg/ml). Last SCr was on 9/14 - 0.75, CrCl~50-60 ml/min.   To avoid delays - will give the 1g dose this evening since RN already has on the floor and start the new dosing on 9/19 AM.  Plan: 1. Vancomycin 1g IV x 1 more dose tonight 2. Adjust the Vancomycin dose to 750 mg IV every 12 hours starting on 9/19 AM 3. Will continue to follow renal function, culture results, LOT, and antibiotic de-escalation plans   Height: 5\' 5"  (165.1 cm) Weight: 132 lb 6.4 oz (60.1 kg) IBW/kg (Calculated) : 57  Temp (24hrs), Avg:98.5 F (36.9 C), Min:98.2 F (36.8 C), Max:98.7 F (37.1 C)   Recent Labs Lab 12/17/16 2204 12/17/16 2232 12/18/16 0513 12/19/16 0449 12/23/16 2043  WBC 8.3  --   --  7.7  --   CREATININE 0.67  --  0.71 0.75  --   LATICACIDVEN  --  1.41  --   --   --   VANCOTROUGH  --   --   --   --  9*    Estimated Creatinine Clearance: 52.2 mL/min (by C-G formula based on SCr of 0.75 mg/dL).    Allergies  Allergen Reactions  . Tarka [Trandolapril-Verapamil Hcl Er] Swelling    Slowed heart rate Pt reports allergy to any cardiac medication ending in "-il"  . Atorvastatin Other (See Comments)    Stated that it lowered her heart rate  . Contrast Media [Iodinated Diagnostic Agents] Itching   Antimicrobials this admission:  Vanc 9/12 >> Zosyn 9/12 >>9/15  Dose adjustments this admission:  9/18: VT 9 mcg/ml on 1g/24h >> adjusted to 750 mg/12h  Microbiology results:  9/17 BCx: pending 9/15 BCx: staph epidermidis in 1/2  9/13 BCx: staph epidermidis in 1/2 9/13 BCID w/ MRSE 9/12 UCx: >=100,000 COLONIES/mL CoNS sens Bactrim  or Vanc   Thank you for allowing pharmacy to be a part of this patient's care.  Alycia Rossetti, PharmD, BCPS Clinical Pharmacist Pager: [phone] 12/23/2016 10:30 PM

## 2016-12-23 NOTE — Progress Notes (Signed)
PROGRESS NOTE    Rhonda Spencer  IRJ:[id number] DOB: 20-Nov-1940 DOA: 12/17/2016 PCP: Iona Beard, MD    Brief Narrative:  Rhonda Spencer is a 76 y.o. female with medical history significant of  adenocarcinoma of the pancreas s/p pancreaticoduodenectomy, diastolic CHF, DM type 2; who presents with complains of fever for the last 2 days prior to admission. On arrival to ED she was found to have hcap and uti , staph epidermidis on one set of blood cultures and urine cultures. Her PICC line was taken out and repeat blood cultures showed staph epidermidis. ID consulted and echocardiogram ordered. 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 TEE is recommended. Cardiology consulted for possible TEE . Meanwhile patient remains on IV vancomycin.   Assessment & Plan:   Principal Problem:   Sepsis ([place]) Active Problems:   HTN (hypertension)   Essential hypertension   Adenocarcinoma of head of pancreas ([place])   HCAP (healthcare-associated pneumonia)   DM type 2 (diabetes mellitus, type 2) ([place])   Protein-calorie malnutrition, severe   Sepsis possibly from health care associated pneumonia and a urinary tract infection: CT angio of the chest ruled out pulmonary embolism.  Urine cultures MRSE  Blood cultures on 2 separate times grew coag neg staph epidermididis. ID consulted and recommendations given.  Echocardiogram ordered, showed a possible vegetation on the outflow side of the aortic valve, possible endocarditis. TEE to be scheduled. Cardiology made aware of the need for TEE. Discussed the results with the patient.  Removed the PICC used for TPN, . Will await for blood cultures to be negative before inserting a new PICC line.  Patient's son was visibly upset when talked about getting another PICC on 9/18, discussed in detail the need for the PICC, risks and  benefits , and he agreed to it.  Will repeat blood cultures after TEE and PICC line after 48 hours of negative blood cultures.    Hypertension: well controlled.    Diabetes mellitus:  CBG (last 3)   Recent Labs  12/22/16 1304 12/22/16 1631 12/23/16 0924  GLUCAP 164* 185* 147*    Resume SSI, her cbgs are improving.  We have discontinued her leveimir.  hgba1c is pending.   Diastolic heart failure:  It appears compensated.  Daily weights, strict intake and output.    Adenocarcinoma of the pancreas s/p pancreaticoduodenectomy:  Resume pancreas enzymes.  She underwent J tube placement, but couldn't tolerate tube feeds.  PICC line was placed and TPN was started piror to discharge one month ago.  She has been on TPN for almost a month now, but she is able to tolerate soft diet. Discussed with Dr Barry Dienes over the phone and discontinued the TPN and PICC line.  Dr Barry Dienes  Recommended her low fat diet as tolerated.     Severe protein energy malnutrition:  Dietary consulted and recommendations given.    Normocytic anemia:  Anemia of chronic disease.  Hemoglobin stable. Repeat CBC in am.   Back spasms persistent back pain; unclear etiology. magnesium replaced.  tsh wnl.  Calcium slghtly low.  She reports persistent back pain, even with pain meds, in view of her recent pancretic cancer will get CT lumbar spine and thoracic spine. 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 Discussed the results with the patient.  Recommend PT and pain medication for now.   Hyponatremia:  Improved. Repeat BMP in am.   Abnormal CT ; New reticulonodular opacity in the right upper lobe is likely infectious or inflammatory. Small subpleural 8 x 4 mm nodular opacity in the right  upper lobe was also new, nonspecific. Recommend short-term follow-up CT in 3 months to evaluate for resolution or Change.     DVT prophylaxis: (Lovenox/) Code Status: (Full) Family Communication: none at bedside.  Disposition Plan: pending evaluation with a TEE, possibly home by the end of the week.   Consultants:   None.   Procedures: echocardiogram  TEE to be scheduled.   Antimicrobials: vancomycin since admission.  Subjective: Some anxiety and chest discomfort on deep inspiration.  No nausea or vomiting.  No sob.  Back pain improved with pain medications.   Objective: Vitals:   12/22/16 0436 12/22/16 1529 12/22/16 2116 12/23/16 0638  BP: (!) 149/64 138/64 129/62 (!) 143/65  Pulse: 88 83 82 85  Resp: 18 16 20 18   Temp: 98.5 F (36.9 C) 98.8 F (37.1 C) 98.4 F (36.9 C) 98.7 F (37.1 C)  TempSrc: Oral  Oral Oral  SpO2: 100% 100% 100% 98%  Weight: 60.1 kg (132 lb 6.4 oz)     Height:        Intake/Output Summary (Last 24 hours) at 12/23/16 1127 Last data filed at 12/23/16 0900  Gross per 24 hour  Intake              120 ml  Output                0 ml  Net              120 ml   Filed Weights   12/17/16 2212 12/21/16 0341 12/22/16 0436  Weight: 55.3 kg (122 lb) 60.1 kg (132 lb 9.6 oz) 60.1 kg (132 lb 6.4 oz)    Examination:  General exam: moderately malnourished , but appears calm. Not in any distress.  Respiratory system: clear to auscultation, no wheezing or rhonchi.  Cardiovascular system: S1 & S2 , RRR, no JVD, no murmer. No pedal edema.  Gastrointestinal system: Abdomen is soft non tender non distended bowel sounds heard.  Central nervous system: Alert, oriented. Non focal.  Extremities: no cyanosis or clubbing. No pedal edema.  Skin: No rashes, lesions or ulcers Psychiatry: Judgement and insight appear normal. Mood & affect appropriate.  Back: improvedTenderness in the paraspinal area.     Data Reviewed: I have personally reviewed following labs  and imaging studies  CBC:  Recent Labs Lab 12/17/16 2204 12/19/16 0449  WBC 8.3 7.7  NEUTROABS 6.1 4.0  HGB 9.4* 8.7*  HCT 28.6* 26.6*  MCV 87.5 87.2  PLT 283 497   Basic Metabolic Panel:  Recent Labs Lab 12/17/16 2204 12/18/16 0513 12/19/16 0449 12/20/16 1115  NA 127* 131* 133*  --   K 4.9 4.4 3.4* 4.0  CL 100* 102 105  --   CO2 19* 22 24  --   GLUCOSE 149* 169* 62*  --   BUN 20 23* 17  --   CREATININE 0.67 0.71 0.75  --   CALCIUM 7.8* 8.1* 8.4*  --   MG  --  1.8 1.9 1.8  PHOS  --  3.8 3.1 3.4   GFR: Estimated Creatinine Clearance: 52.2 mL/min (by C-G formula based on SCr of  0.75 mg/dL). Liver Function Tests:  Recent Labs Lab 12/17/16 2204 12/18/16 0513 12/19/16 0449  AST 27 18 18   ALT 19 15 14   ALKPHOS 101 95 84  BILITOT 1.4* 1.1 0.7  PROT 6.5 6.7 6.4*  ALBUMIN 2.7* 2.6* 2.5*   No results for input(s): LIPASE, AMYLASE in the last 168 hours. No results for input(s): AMMONIA in the last 168 hours. Coagulation Profile:  Recent Labs Lab 12/17/16 2204  INR 1.16   Cardiac Enzymes: No results for input(s): CKTOTAL, CKMB, CKMBINDEX, TROPONINI in the last 168 hours. BNP (last 3 results) No results for input(s): PROBNP in the last 8760 hours. HbA1C: No results for input(s): HGBA1C in the last 72 hours. CBG:  Recent Labs Lab 12/21/16 1715 12/22/16 0813 12/22/16 1304 12/22/16 1631 12/23/16 0924  GLUCAP 152* 95 164* 185* 147*   Lipid Profile: No results for input(s): CHOL, HDL, LDLCALC, TRIG, CHOLHDL, LDLDIRECT in the last 72 hours. Thyroid Function Tests:  Recent Labs  12/21/16 0643  TSH 1.517   Anemia Panel: No results for input(s): VITAMINB12, FOLATE, FERRITIN, TIBC, IRON, RETICCTPCT in the last 72 hours. Sepsis Labs:  Recent Labs Lab 12/17/16 2232 12/18/16 0513  PROCALCITON  --  0.54  LATICACIDVEN 1.41  --     Recent Results (from the past 240 hour(s))  Urine culture     Status: Abnormal   Collection Time: 12/17/16 11:45 PM    Result Value Ref Range Status   Specimen Description URINE, CLEAN CATCH  Final   Special Requests Normal  Final   Culture (A)  Final    >=100,000 COLONIES/mL STAPHYLOCOCCUS SPECIES (COAGULASE NEGATIVE)   Report Status 12/20/2016 FINAL  Final   Organism ID, Bacteria STAPHYLOCOCCUS SPECIES (COAGULASE NEGATIVE) (A)  Final      Susceptibility   Staphylococcus species (coagulase negative) - MIC*    CIPROFLOXACIN 4 RESISTANT Resistant     GENTAMICIN <=0.5 SENSITIVE Sensitive     NITROFURANTOIN <=16 SENSITIVE Sensitive     OXACILLIN >=4 RESISTANT Resistant     TETRACYCLINE >=16 RESISTANT Resistant     VANCOMYCIN 2 SENSITIVE Sensitive     TRIMETH/SULFA <=10 SENSITIVE Sensitive     CLINDAMYCIN >=8 RESISTANT Resistant     RIFAMPIN <=0.5 SENSITIVE Sensitive     Inducible Clindamycin NEGATIVE Sensitive     * >=100,000 COLONIES/mL STAPHYLOCOCCUS SPECIES (COAGULASE NEGATIVE)  Culture, blood (Routine x 2)     Status: Abnormal   Collection Time: 12/18/16 12:00 AM  Result Value Ref Range Status   Specimen Description BLOOD RIGHT HAND RIGHT THUMB  Final   Special Requests   Final    BOTTLES DRAWN AEROBIC AND ANAEROBIC Blood Culture adequate volume   Culture  Setup Time   Final    GRAM POSITIVE COCCI IN CLUSTERS IN BOTH AEROBIC AND ANAEROBIC BOTTLES T.DANG PHARMD 12/19/16 0052 L.CHAMPION    Culture (A)  Final    STAPHYLOCOCCUS EPIDERMIDIS SUSCEPTIBILITIES PERFORMED ON PREVIOUS CULTURE WITHIN THE LAST 5 DAYS.    Report Status 12/22/2016 FINAL  Final  Blood Culture ID Panel (Reflexed)     Status: Abnormal   Collection Time: 12/18/16 12:00 AM  Result Value Ref Range Status   Enterococcus species NOT DETECTED NOT DETECTED Final   Vancomycin resistance NOT DETECTED NOT DETECTED Final   Listeria monocytogenes NOT DETECTED NOT DETECTED Final   Staphylococcus species DETECTED (A) NOT DETECTED Final    Comment: CRITICAL RESULT CALLED TO, READ BACK BY AND VERIFIED WITH: T.DANG PHARMD 12/19/16 0052  L.CHAMPION  Staphylococcus aureus NOT DETECTED NOT DETECTED Final   Methicillin resistance DETECTED (A) NOT DETECTED Final    Comment: CRITICAL RESULT CALLED TO, READ BACK BY AND VERIFIED WITH: T.DANG PHARMD 12/19/16 0052 L.CHAMPION    Streptococcus species NOT DETECTED NOT DETECTED Final   Streptococcus agalactiae NOT DETECTED NOT DETECTED Final   Streptococcus pneumoniae NOT DETECTED NOT DETECTED Final   Streptococcus pyogenes NOT DETECTED NOT DETECTED Final   Acinetobacter baumannii NOT DETECTED NOT DETECTED Final   Enterobacteriaceae species NOT DETECTED NOT DETECTED Final   Enterobacter cloacae complex NOT DETECTED NOT DETECTED Final   Escherichia coli NOT DETECTED NOT DETECTED Final   Klebsiella oxytoca NOT DETECTED NOT DETECTED Final   Klebsiella pneumoniae NOT DETECTED NOT DETECTED Final   Proteus species NOT DETECTED NOT DETECTED Final   Serratia marcescens NOT DETECTED NOT DETECTED Final   Carbapenem resistance NOT DETECTED NOT DETECTED Final   Haemophilus influenzae NOT DETECTED NOT DETECTED Final   Neisseria meningitidis NOT DETECTED NOT DETECTED Final   Pseudomonas aeruginosa NOT DETECTED NOT DETECTED Final   Candida albicans NOT DETECTED NOT DETECTED Final   Candida glabrata NOT DETECTED NOT DETECTED Final   Candida krusei NOT DETECTED NOT DETECTED Final   Candida parapsilosis NOT DETECTED NOT DETECTED Final   Candida tropicalis NOT DETECTED NOT DETECTED Final  Culture, blood (Routine x 2)     Status: None (Preliminary result)   Collection Time: 12/18/16 12:25 AM  Result Value Ref Range Status   Specimen Description BLOOD RIGHT HAND  Final   Special Requests IN PEDIATRIC BOTTLE Blood Culture adequate volume  Final   Culture NO GROWTH 4 DAYS  Final   Report Status PENDING  Incomplete  Culture, blood (Routine X 2) w Reflex to ID Panel     Status: Abnormal   Collection Time: 12/20/16 12:15 AM  Result Value Ref Range Status   Specimen Description BLOOD RIGHT ARM   Final   Special Requests IN PEDIATRIC BOTTLE Blood Culture adequate volume  Final   Culture  Setup Time   Final    GRAM POSITIVE COCCI IN CLUSTERS IN PEDIATRIC BOTTLE CRITICAL RESULT CALLED TO, READ BACK BY AND VERIFIED WITH: L POWELL,PHARMD AT 2044 12/20/16 BY L BENFIELD    Culture STAPHYLOCOCCUS EPIDERMIDIS (A)  Final   Report Status 12/22/2016 FINAL  Final   Organism ID, Bacteria STAPHYLOCOCCUS EPIDERMIDIS  Final      Susceptibility   Staphylococcus epidermidis - MIC*    CIPROFLOXACIN 4 RESISTANT Resistant     ERYTHROMYCIN >=8 RESISTANT Resistant     GENTAMICIN <=0.5 SENSITIVE Sensitive     OXACILLIN >=4 RESISTANT Resistant     TETRACYCLINE >=16 RESISTANT Resistant     VANCOMYCIN 2 SENSITIVE Sensitive     TRIMETH/SULFA <=10 SENSITIVE Sensitive     CLINDAMYCIN >=8 RESISTANT Resistant     RIFAMPIN <=0.5 SENSITIVE Sensitive     Inducible Clindamycin NEGATIVE Sensitive     * STAPHYLOCOCCUS EPIDERMIDIS  Culture, blood (Routine X 2) w Reflex to ID Panel     Status: None (Preliminary result)   Collection Time: 12/20/16 12:15 AM  Result Value Ref Range Status   Specimen Description BLOOD RIGHT HAND  Final   Special Requests IN PEDIATRIC BOTTLE Blood Culture adequate volume  Final   Culture NO GROWTH 2 DAYS  Final   Report Status PENDING  Incomplete         Radiology Studies: Ct Thoracic Spine Wo Contrast  Result Date: 12/22/2016 CLINICAL DATA:  Initial evaluation  for back pain localized between the shoulder blades for several days. History of remote lumbar surgery. EXAM: CT THORACIC AND LUMBAR SPINE WITHOUT CONTRAST TECHNIQUE: Multidetector CT imaging of the thoracic and lumbar spine was performed without contrast. Multiplanar CT image reconstructions were also generated. COMPARISON:  Comparison made with previous radiograph from 08/21/2015. FINDINGS: CT THORACIC SPINE FINDINGS Alignment: 3 mm anterolisthesis of C7 on T1, likely due to chronic facet degeneration. Vertebral bodies  otherwise normally aligned with preservation of the normal thoracic kyphosis. Vertebrae: Vertebral body heights well maintained. No evidence for acute or chronic fracture. No discrete worrisome lytic or blastic osseous lesions. Paraspinal and other soft tissues: Paraspinous soft tissues demonstrate no acute abnormality. Small layering bilateral pleural effusions with associated atelectasis. Irregular nodule measuring 16 x 11 mm at the posterior aspect of the right upper lobe (series 5, image 47). Cardiomegaly partially visualized. Large hepatic cysts with sequelae of prior cholecystectomy and probable pneumobilia partially visualized as well. Disc levels: Degenerative spondylolysis noted within the visualized lower cervical spine mild-to-moderate right C6-7 and C7-T1 foraminal stenosis, partially visualized. No significant canal narrowing. T1-2:  Negative disc.  Minimal facet hypertrophy.  No stenosis. T2-3: Negative disc. Mild facet hypertrophy. No significant stenosis. T3-4:  Negative disc.  Mild facet hypertrophy.  No stenosis. T4-5: Degenerative intervertebral disc space narrowing with reactive endplate changes. Bilateral facet arthrosis. No significant canal stenosis. Mild bilateral bony foraminal narrowing. T5-6: Degenerative intervertebral disc space narrowing with probable mild disc bulge. Bilateral facet arthrosis. No canal stenosis. Mild bilateral bony foraminal narrowing, greater on the left. T6-7: Degenerative intervertebral disc space narrowing with reactive endplate changes and endplate osteophytic spurring. Mild facet degeneration. Mild left bony foraminal narrowing. No significant canal stenosis. T7-8: Mild disc bulge with bony endplate spurring. Mild facet hypertrophy. No stenosis. T8-9: Mild disc bulge with intervertebral disc space narrowing and endplate osteophytosis. Mild facet hypertrophy. No significant stenosis. T9-10: Degenerative intervertebral disc space narrowing with endplate sclerosis  and osteophytosis. Minimal disc bulge. No canal stenosis. Mild bilateral foraminal narrowing, left greater than right. T10-11:  Minimal disc bulge.  No stenosis. T11-12:  Mild facet hypertrophy.  No stenosis. T12-L1:  Bilateral facet arthrosis.  No stenosis. CT LUMBAR SPINE FINDINGS Segmentation: Normal segmentation. Lowest well-formed disc labeled the L5-S1 level. Alignment: 5 mm anterolisthesis of L4 on L5. Vertebral bodies otherwise normally aligned with preservation of the normal lumbar lordosis. Vertebrae: Vertebral body heights maintained. No evidence for acute or chronic fracture. No discrete lytic or blastic osseous lesions. Visualized sacrum and pelvis intact and unremarkable. Paraspinal and other soft tissues: Paraspinous soft tissues within normal limits. Hepatic cysts with pneumobilia and post cholecystectomy changes noted. Enteric catheter partially visualized. Disc levels: L1-2: Right eccentric disc bulge. Mild facet hypertrophy. No significant canal stenosis. Bulging disc closely approximates the exiting right L1 nerve root without frank impingement. No significant foraminal encroachment. L2-3: Diffuse degenerative disc bulge. Moderate facet arthrosis with ligamentum flavum hypertrophy. Mild canal with moderate subarticular stenosis. Mild left foraminal narrowing. L3-4: Diffuse disc bulge with disc desiccation. Advanced facet arthropathy with ligamentum flavum thickening. Resultant severe canal and bilateral subarticular stenosis. Moderate bilateral foraminal stenosis, right worse than left. L4-5: 5 mm anterolisthesis. Chronic degenerative intervertebral disc space narrowing with disc desiccation and diffuse disc bulge. A advanced bilateral facet arthrosis. Patient is status post decompressive laminectomy at this level. No definite residual canal stenosis, although there is suspected residual and least moderate bilateral subarticular narrowing. Severe right with moderate left foraminal stenosis.  L5-S1: Diffuse degenerative disc  bulge with disc desiccation and intervertebral disc space narrowing. Associated chronic reactive endplate changes with marginal endplate osteophytosis. Moderate bilateral facet arthrosis left greater than right. Patient status post remote decompressive laminectomy. No significant canal stenosis or definite neural impingement. Moderate bilateral L5 foraminal narrowing. IMPRESSION: CT THORACIC SPINE IMPRESSION 1. No acute abnormality within the thoracic spine. 2. 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 Changes most notable at the T6-7 and T9-10 levels. No significant canal stenosis. 3. Irregular 16 x 11 mm nodule within the posterior right upper lobe, increased in size relative to recent CT, and suspected to be either infectious or inflammatory nature. Follow-up chest CT in 3 months as previously recommended. 4. Small layering bilateral pleural effusions with associated atelectasis. CT LUMBAR SPINE IMPRESSION 1. No acute abnormality within the lumbar spine. 2. 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 3. 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 4. Postoperative changes from remote decompressive laminectomy at L4-5 and L5-S1. 5. Fairly advanced bilateral facet arthropathy at L3-4 through L5-S1. Electronically Signed   By: Jeannine Boga M.D.   On: 12/22/2016 21:17   Ct Lumbar Spine Wo Contrast  Result Date: 12/22/2016 CLINICAL DATA:  Initial evaluation for back pain localized between the shoulder blades for several days. History of remote lumbar surgery. EXAM: CT THORACIC AND LUMBAR SPINE WITHOUT CONTRAST TECHNIQUE: Multidetector CT  imaging of the thoracic and lumbar spine was performed without contrast. Multiplanar CT image reconstructions were also generated. COMPARISON:  Comparison made with previous radiograph from 08/21/2015. FINDINGS: CT THORACIC SPINE FINDINGS Alignment: 3 mm anterolisthesis of C7 on T1, likely due to chronic facet degeneration. Vertebral bodies otherwise normally aligned with preservation of the normal thoracic kyphosis. Vertebrae: Vertebral body heights well maintained. No evidence for acute or chronic fracture. No discrete worrisome lytic or blastic osseous lesions. Paraspinal and other soft tissues: Paraspinous soft tissues demonstrate no acute abnormality. Small layering bilateral pleural effusions with associated atelectasis. Irregular nodule measuring 16 x 11 mm at the posterior aspect of the right upper lobe (series 5, image 47). Cardiomegaly partially visualized. Large hepatic cysts with sequelae of prior cholecystectomy and probable pneumobilia partially visualized as well. Disc levels: Degenerative spondylolysis noted within the visualized lower cervical spine mild-to-moderate right C6-7 and C7-T1 foraminal stenosis, partially visualized. No significant canal narrowing. T1-2:  Negative disc.  Minimal facet hypertrophy.  No stenosis. T2-3: Negative disc. Mild facet hypertrophy. No significant stenosis. T3-4:  Negative disc.  Mild facet hypertrophy.  No stenosis. T4-5: Degenerative intervertebral disc space narrowing with reactive endplate changes. Bilateral facet arthrosis. No significant canal stenosis. Mild bilateral bony foraminal narrowing. T5-6: Degenerative intervertebral disc space narrowing with probable mild disc bulge. Bilateral facet arthrosis. No canal stenosis. Mild bilateral bony foraminal narrowing, greater on the left. T6-7: Degenerative intervertebral disc space narrowing with reactive endplate changes and endplate osteophytic spurring. Mild facet degeneration. Mild left bony foraminal  narrowing. No significant canal stenosis. T7-8: Mild disc bulge with bony endplate spurring. Mild facet hypertrophy. No stenosis. T8-9: Mild disc bulge with intervertebral disc space narrowing and endplate osteophytosis. Mild facet hypertrophy. No significant stenosis. T9-10: Degenerative intervertebral disc space narrowing with endplate sclerosis and osteophytosis. Minimal disc bulge. No canal stenosis. Mild bilateral foraminal narrowing, left greater than right.  T10-11:  Minimal disc bulge.  No stenosis. T11-12:  Mild facet hypertrophy.  No stenosis. T12-L1:  Bilateral facet arthrosis.  No stenosis. CT LUMBAR SPINE FINDINGS Segmentation: Normal segmentation. Lowest well-formed disc labeled the L5-S1 level. Alignment: 5 mm anterolisthesis of L4 on L5. Vertebral bodies otherwise normally aligned with preservation of the normal lumbar lordosis. Vertebrae: Vertebral body heights maintained. No evidence for acute or chronic fracture. No discrete lytic or blastic osseous lesions. Visualized sacrum and pelvis intact and unremarkable. Paraspinal and other soft tissues: Paraspinous soft tissues within normal limits. Hepatic cysts with pneumobilia and post cholecystectomy changes noted. Enteric catheter partially visualized. Disc levels: L1-2: Right eccentric disc bulge. Mild facet hypertrophy. No significant canal stenosis. Bulging disc closely approximates the exiting right L1 nerve root without frank impingement. No significant foraminal encroachment. L2-3: Diffuse degenerative disc bulge. Moderate facet arthrosis with ligamentum flavum hypertrophy. Mild canal with moderate subarticular stenosis. Mild left foraminal narrowing. L3-4: Diffuse disc bulge with disc desiccation. Advanced facet arthropathy with ligamentum flavum thickening. Resultant severe canal and bilateral subarticular stenosis. Moderate bilateral foraminal stenosis, right worse than left. L4-5: 5 mm anterolisthesis. Chronic degenerative intervertebral  disc space narrowing with disc desiccation and diffuse disc bulge. A advanced bilateral facet arthrosis. Patient is status post decompressive laminectomy at this level. No definite residual canal stenosis, although there is suspected residual and least moderate bilateral subarticular narrowing. Severe right with moderate left foraminal stenosis. L5-S1: Diffuse degenerative disc bulge with disc desiccation and intervertebral disc space narrowing. Associated chronic reactive endplate changes with marginal endplate osteophytosis. Moderate bilateral facet arthrosis left greater than right. Patient status post remote decompressive laminectomy. No significant canal stenosis or definite neural impingement. Moderate bilateral L5 foraminal narrowing. IMPRESSION: CT THORACIC SPINE IMPRESSION 1. No acute abnormality within the thoracic spine. 2. 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 Changes most notable at the T6-7 and T9-10 levels. No significant canal stenosis. 3. Irregular 16 x 11 mm nodule within the posterior right upper lobe, increased in size relative to recent CT, and suspected to be either infectious or inflammatory nature. Follow-up chest CT in 3 months as previously recommended. 4. Small layering bilateral pleural effusions with associated atelectasis. CT LUMBAR SPINE IMPRESSION 1. No acute abnormality within the lumbar spine. 2. 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 3. 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 4. Postoperative changes from remote decompressive laminectomy at L4-5 and L5-S1. 5. Fairly advanced bilateral facet arthropathy at L3-4 through L5-S1.  Electronically Signed   By: Jeannine Boga M.D.   On: 12/22/2016 21:17        Scheduled Meds: . aspirin EC  81 mg Oral Daily  . enoxaparin (LOVENOX) injection  40 mg Subcutaneous Q24H  . feeding supplement (ENSURE ENLIVE)  237 mL Oral TID BM  . felodipine  5 mg Oral Daily  . guaiFENesin  600 mg Oral BID  . insulin aspart  0-10 Units Subcutaneous TID WC  . lipase/protease/amylase  36,000 Units Oral TID WC  . pantoprazole  40 mg Oral BID   Continuous Infusions: . methocarbamol (ROBAXIN)  IV 500 mg (12/21/16 1736)  . vancomycin Stopped (12/22/16 2208)     LOS: 5 days    Time spent: 40  minutes.  Hosie Poisson, MD [facility] Pager [phone] If 7PM-7AM, please contact night-coverage [email or web address] Password [email or web address] 12/23/2016, 11:27 AM

## 2016-12-24 DIAGNOSIS — I330 Acute and subacute infective endocarditis: Secondary | ICD-10-CM

## 2016-12-24 LAB — COMPREHENSIVE METABOLIC PANEL
ALBUMIN: 2.6 g/dL — AB (ref 3.5–5.0)
ALK PHOS: 89 U/L (ref 38–126)
ALT: 15 U/L (ref 14–54)
AST: 21 U/L (ref 15–41)
Anion gap: 8 (ref 5–15)
BILIRUBIN TOTAL: 0.6 mg/dL (ref 0.3–1.2)
BUN: 8 mg/dL (ref 6–20)
CALCIUM: 8.6 mg/dL — AB (ref 8.9–10.3)
CO2: 25 mmol/L (ref 22–32)
CREATININE: 0.67 mg/dL (ref 0.44–1.00)
Chloride: 99 mmol/L — ABNORMAL LOW (ref 101–111)
GFR calc Af Amer: >60 mL/min (ref >60)
GLUCOSE: 109 mg/dL — AB (ref 65–99)
Potassium: 4 mmol/L (ref 3.5–5.1)
Sodium: 132 mmol/L — ABNORMAL LOW (ref 135–145)
TOTAL PROTEIN: 6.5 g/dL (ref 6.5–8.1)

## 2016-12-24 LAB — GLUCOSE, CAPILLARY
GLUCOSE-CAPILLARY: 138 mg/dL — AB (ref 65–99)
GLUCOSE-CAPILLARY: 108 mg/dL — AB (ref 65–99)
Glucose-Capillary: 105 mg/dL — ABNORMAL HIGH (ref 65–99)
Glucose-Capillary: 147 mg/dL — ABNORMAL HIGH (ref 65–99)

## 2016-12-24 LAB — CBC
HEMATOCRIT: 29.4 % — AB (ref 36.0–46.0)
HEMOGLOBIN: 9.4 g/dL — AB (ref 12.0–15.0)
MCH: 28.2 pg (ref 26.0–34.0)
MCHC: 32 g/dL (ref 30.0–36.0)
MCV: 88.3 fL (ref 78.0–100.0)
Platelets: 422 10*3/uL — ABNORMAL HIGH (ref 150–400)
RBC: 3.33 MIL/uL — ABNORMAL LOW (ref 3.87–5.11)
RDW: 13.7 % (ref 11.5–15.5)
WBC: 6.4 10*3/uL (ref 4.0–10.5)

## 2016-12-24 NOTE — Progress Notes (Signed)
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 Upon ED evaluation she was found to have HCAP and UTI, and blood cultures staph epi was found. PICC line was taken and on repeated blood culture should staph epi again. ID was consulted and ordered an echocardiogram which was consistent with an aortic valve vegetation and endocarditis. TEE was recommended. Patient remains in IV vancomycin.  Subjective: Patient seen and examined doing well has no complaints. Remains afebrile. Denies chest pain, shortness of breath and palpitations.  Assessment & Plan:   Principal Problem:   Sepsis ([place]) Active Problems:   HTN (hypertension)   Essential hypertension   Adenocarcinoma of head of pancreas ([place])   HCAP (healthcare-associated pneumonia)   DM type 2 (diabetes mellitus, type 2) ([place])   Protein-calorie malnutrition, severe  Sepsis secondary to healthcare associated pneumonia and urinary tract infection. 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  TEE has been deferred, patient is advised to have a course of vancomycin for 2 weeks. Plan is to insert PICC line after repeated blood cultures are negative for 48 hours. Check vancomycin level per pharmacy recommendation  Adenocarcinoma of the pancreas status post pancreaticoduodenectomy Continue pancreas enzymes She was on TPN for almost a month but she is able to  tolerate soft diet. Case was discussed with Dr. Barry Dienes recommended to discontinue TPN for the moment. Patient started on low-fat diet as tolerated.   Hypertension BP well-controlled Continue current management  Diabetes mellitus CBGs stable Continue SSI A1c 7.1  Back spasms persistent back pain; unclear etiology. She reports persistent back pain, even with pain meds, in view of her recent pancretic cancer CT lumbar spine and thoracic spine was done. 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 Recommend PT and pain medication for now.   Abnormal CT ; New reticulonodular opacity in the right upper lobe is likely infectious or inflammatory. Small subpleural 8 x 4 mm nodular opacity in the right upper lobe was also new, nonspecific. Recommend short-term follow-up CT in 3 months to evaluate for resolution or Change.  DVT prophylaxis: Lovenox Code Status: full code Family Communication: none at bedside Disposition Plan: home in the next 24-48 hours  Consultants:   Infectious disease  Procedures:   Echocardiogram 12/22/16 ------------------------------------------------------------------- Study Conclusions  - Left ventricle: The cavity size was normal. Wall thickness was   increased in a pattern of mild LVH. There was moderate focal   basal hypertrophy of the septum. Systolic function was normal.   The estimated ejection fraction was in the range of 60% to 65%.   Doppler parameters are consistent with abnormal left ventricular   relaxation (grade 1 diastolic dysfunction). - Aortic valve: Trileaflet. Sclerosis without stenosis. There is a   possible mobile filamentous structure on the outflow side of the  aortic valve (seen in image 17 and 22), which could be  consistent   with vegetation. Mild to moderate regurgitation. - Mitral valve: Mildly thickened leaflets . There was mild   regurgitation. - Left atrium: The atrium was normal in size. - Tricuspid valve: There was moderate regurgitation. - Pulmonary arteries: PA peak pressure: 40 mm Hg (S). - Inferior vena cava: The vessel was normal in size. The   respirophasic diameter changes were in the normal range (>= 50%),   consistent with normal central venous pressure. - Pericardium, extracardiac: There was no pericardial effusion.  Impressions:  - Compared to a prior study in 10/2016, the LVEF is higher. 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 There is a suggestion of a   filmatenous structure associated with the outflow tract of the   aortic valve which may represent vegetation in a patient with   endocarditis. Recommend TEE for better visualization of the   aortic valve.  Antimicrobials: Anti-infectives    Start     Dose/Rate Route Frequency Ordered Stop   12/24/16 1100  vancomycin (VANCOCIN) IVPB 750 mg/150 ml premix     750 mg 150 mL/hr over 60 Minutes Intravenous Every 12 hours 12/23/16 2231     12/20/16 2200  vancomycin (VANCOCIN) IVPB 1000 mg/200 mL premix     1,000 mg 200 mL/hr over 60 Minutes Intravenous Every 24 hours 12/20/16 2100 12/24/16 0016   12/20/16 1500  sulfamethoxazole-trimethoprim (BACTRIM DS,SEPTRA DS) 800-160 MG per tablet 1 tablet  Status:  Discontinued     1 tablet Oral Every 12 hours 12/20/16 1431 12/20/16 2102   12/18/16 2300  vancomycin (VANCOCIN) IVPB 1000 mg/200 mL premix  Status:  Discontinued     1,000 mg 200 mL/hr over 60 Minutes Intravenous Every 24 hours 12/17/16 2306 12/20/16 1421   12/18/16 0800  piperacillin-tazobactam (ZOSYN) IVPB 3.375 g  Status:  Discontinued     3.375 g 12.5 mL/hr over 240 Minutes Intravenous Every 8 hours 12/17/16 2306 12/20/16 1421   12/17/16 2300  piperacillin-tazobactam (ZOSYN) IVPB 3.375 g     3.375 g 100 mL/hr over  30 Minutes Intravenous  Once 12/17/16 2257 12/18/16 0115   12/17/16 2300  vancomycin (VANCOCIN) IVPB 1000 mg/200 mL premix     1,000 mg 200 mL/hr over 60 Minutes Intravenous  Once 12/17/16 2257 12/18/16 0146       Objective: Vitals:   12/24/16 0516 12/24/16 0845 12/24/16 0900 12/24/16 1300  BP: 127/64 (!) 131/59 (!) 133/56 136/61  Pulse: 91 83  84  Resp: 20 18  20   Temp: 98.6 F (37 C) 99 F (37.2 C)  98.5 F (36.9 C)  TempSrc: Oral Oral  Oral  SpO2: 100% 100%  100%  Weight:      Height:        Intake/Output Summary (Last 24 hours) at 12/24/16 1708 Last data filed at 12/24/16 1500  Gross per 24 hour  Intake              325 ml  Output                0 ml  Net              325 ml   Filed Weights   12/17/16 2212 12/21/16 0341 12/22/16 0436  Weight: 55.3 kg (122 lb) 60.1 kg (132 lb 9.6 oz) 60.1 kg (132 lb 6.4 oz)    Examination:  General exam: Appears calm and comfortable  HEENT: AC/AT,  PERRLA, OP moist and clear Respiratory system: Clear to auscultation. No wheezes,crackle or rhonchi Cardiovascular system: S1 & S2 heard, RRR. No JVD, Positive systolic murmur Gastrointestinal system: Abdomen is nondistended, soft and nontender. No organomegaly or masses felt. Normal bowel sounds heard. Central nervous system: Alert and oriented. No focal neurological deficits. Extremities: No pedal edema.  Skin: No rashes, lesions or ulcers Psychiatry: Judgement and insight appear normal. Mood & affect appropriate.    Data Reviewed: I have personally reviewed following labs and imaging studies  CBC:  Recent Labs Lab 12/17/16 2204 12/19/16 0449 12/24/16 0427  WBC 8.3 7.7 6.4  NEUTROABS 6.1 4.0  --   HGB 9.4* 8.7* 9.4*  HCT 28.6* 26.6* 29.4*  MCV 87.5 87.2 88.3  PLT 283 286 619*   Basic Metabolic Panel:  Recent Labs Lab 12/17/16 2204 12/18/16 0513 12/19/16 0449 12/20/16 1115 12/24/16 0427  NA 127* 131* 133*  --  132*  K 4.9 4.4 3.4* 4.0 4.0  CL 100* 102 105  --   99*  CO2 19* 22 24  --  25  GLUCOSE 149* 169* 62*  --  109*  BUN 20 23* 17  --  8  CREATININE 0.67 0.71 0.75  --  0.67  CALCIUM 7.8* 8.1* 8.4*  --  8.6*  MG  --  1.8 1.9 1.8  --   PHOS  --  3.8 3.1 3.4  --    GFR: Estimated Creatinine Clearance: 52.2 mL/min (by C-G formula based on SCr of 0.67 mg/dL). Liver Function Tests:  Recent Labs Lab 12/17/16 2204 12/18/16 0513 12/19/16 0449 12/24/16 0427  AST 27 18 18 21   ALT 19 15 14 15   ALKPHOS 101 95 84 89  BILITOT 1.4* 1.1 0.7 0.6  PROT 6.5 6.7 6.4* 6.5  ALBUMIN 2.7* 2.6* 2.5* 2.6*   No results for input(s): LIPASE, AMYLASE in the last 168 hours. No results for input(s): AMMONIA in the last 168 hours. Coagulation Profile:  Recent Labs Lab 12/17/16 2204  INR 1.16   Cardiac Enzymes: No results for input(s): CKTOTAL, CKMB, CKMBINDEX, TROPONINI in the last 168 hours. BNP (last 3 results) No results for input(s): PROBNP in the last 8760 hours. HbA1C:  Recent Labs  12/23/16 1148  HGBA1C 7.1*   CBG:  Recent Labs Lab 12/23/16 1220 12/23/16 1711 12/23/16 2059 12/24/16 0802 12/24/16 1210  GLUCAP 157* 136* 154* 105* 147*   Lipid Profile: No results for input(s): CHOL, HDL, LDLCALC, TRIG, CHOLHDL, LDLDIRECT in the last 72 hours. Thyroid Function Tests: No results for input(s): TSH, T4TOTAL, FREET4, T3FREE, THYROIDAB in the last 72 hours. Anemia Panel: No results for input(s): VITAMINB12, FOLATE, FERRITIN, TIBC, IRON, RETICCTPCT in the last 72 hours. Sepsis Labs:  Recent Labs Lab 12/17/16 2232 12/18/16 0513  PROCALCITON  --  0.54  LATICACIDVEN 1.41  --     Recent Results (from the past 240 hour(s))  Urine culture     Status: Abnormal   Collection Time: 12/17/16 11:45 PM  Result Value Ref Range Status   Specimen Description URINE, CLEAN CATCH  Final   Special Requests Normal  Final   Culture (A)  Final    >=100,000 COLONIES/mL STAPHYLOCOCCUS SPECIES (COAGULASE NEGATIVE)   Report Status 12/20/2016 FINAL   Final   Organism ID, Bacteria STAPHYLOCOCCUS SPECIES (COAGULASE NEGATIVE) (A)  Final      Susceptibility   Staphylococcus species (coagulase negative) - MIC*    CIPROFLOXACIN 4 RESISTANT Resistant     GENTAMICIN <=0.5 SENSITIVE Sensitive  NITROFURANTOIN <=16 SENSITIVE Sensitive     OXACILLIN >=4 RESISTANT Resistant     TETRACYCLINE >=16 RESISTANT Resistant     VANCOMYCIN 2 SENSITIVE Sensitive     TRIMETH/SULFA <=10 SENSITIVE Sensitive     CLINDAMYCIN >=8 RESISTANT Resistant     RIFAMPIN <=0.5 SENSITIVE Sensitive     Inducible Clindamycin NEGATIVE Sensitive     * >=100,000 COLONIES/mL STAPHYLOCOCCUS SPECIES (COAGULASE NEGATIVE)  Culture, blood (Routine x 2)     Status: Abnormal   Collection Time: 12/18/16 12:00 AM  Result Value Ref Range Status   Specimen Description BLOOD RIGHT HAND RIGHT THUMB  Final   Special Requests   Final    BOTTLES DRAWN AEROBIC AND ANAEROBIC Blood Culture adequate volume   Culture  Setup Time   Final    GRAM POSITIVE COCCI IN CLUSTERS IN BOTH AEROBIC AND ANAEROBIC BOTTLES T.DANG PHARMD 12/19/16 0052 L.CHAMPION    Culture (A)  Final    STAPHYLOCOCCUS EPIDERMIDIS SUSCEPTIBILITIES PERFORMED ON PREVIOUS CULTURE WITHIN THE LAST 5 DAYS.    Report Status 12/22/2016 FINAL  Final  Blood Culture ID Panel (Reflexed)     Status: Abnormal   Collection Time: 12/18/16 12:00 AM  Result Value Ref Range Status   Enterococcus species NOT DETECTED NOT DETECTED Final   Vancomycin resistance NOT DETECTED NOT DETECTED Final   Listeria monocytogenes NOT DETECTED NOT DETECTED Final   Staphylococcus species DETECTED (A) NOT DETECTED Final    Comment: CRITICAL RESULT CALLED TO, READ BACK BY AND VERIFIED WITH: T.DANG PHARMD 12/19/16 0052 L.CHAMPION    Staphylococcus aureus NOT DETECTED NOT DETECTED Final   Methicillin resistance DETECTED (A) NOT DETECTED Final    Comment: CRITICAL RESULT CALLED TO, READ BACK BY AND VERIFIED WITH: T.DANG PHARMD 12/19/16 0052 L.CHAMPION     Streptococcus species NOT DETECTED NOT DETECTED Final   Streptococcus agalactiae NOT DETECTED NOT DETECTED Final   Streptococcus pneumoniae NOT DETECTED NOT DETECTED Final   Streptococcus pyogenes NOT DETECTED NOT DETECTED Final   Acinetobacter baumannii NOT DETECTED NOT DETECTED Final   Enterobacteriaceae species NOT DETECTED NOT DETECTED Final   Enterobacter cloacae complex NOT DETECTED NOT DETECTED Final   Escherichia coli NOT DETECTED NOT DETECTED Final   Klebsiella oxytoca NOT DETECTED NOT DETECTED Final   Klebsiella pneumoniae NOT DETECTED NOT DETECTED Final   Proteus species NOT DETECTED NOT DETECTED Final   Serratia marcescens NOT DETECTED NOT DETECTED Final   Carbapenem resistance NOT DETECTED NOT DETECTED Final   Haemophilus influenzae NOT DETECTED NOT DETECTED Final   Neisseria meningitidis NOT DETECTED NOT DETECTED Final   Pseudomonas aeruginosa NOT DETECTED NOT DETECTED Final   Candida albicans NOT DETECTED NOT DETECTED Final   Candida glabrata NOT DETECTED NOT DETECTED Final   Candida krusei NOT DETECTED NOT DETECTED Final   Candida parapsilosis NOT DETECTED NOT DETECTED Final   Candida tropicalis NOT DETECTED NOT DETECTED Final  Culture, blood (Routine x 2)     Status: None   Collection Time: 12/18/16 12:25 AM  Result Value Ref Range Status   Specimen Description BLOOD RIGHT HAND  Final   Special Requests IN PEDIATRIC BOTTLE Blood Culture adequate volume  Final   Culture NO GROWTH 5 DAYS  Final   Report Status 12/23/2016 FINAL  Final  Culture, blood (Routine X 2) w Reflex to ID Panel     Status: Abnormal   Collection Time: 12/20/16 12:15 AM  Result Value Ref Range Status   Specimen Description BLOOD RIGHT ARM  Final  Special Requests IN PEDIATRIC BOTTLE Blood Culture adequate volume  Final   Culture  Setup Time   Final    GRAM POSITIVE COCCI IN CLUSTERS IN PEDIATRIC BOTTLE CRITICAL RESULT CALLED TO, READ BACK BY AND VERIFIED WITH: L POWELL,PHARMD AT 2044 12/20/16 BY  L BENFIELD    Culture STAPHYLOCOCCUS EPIDERMIDIS (A)  Final   Report Status 12/22/2016 FINAL  Final   Organism ID, Bacteria STAPHYLOCOCCUS EPIDERMIDIS  Final      Susceptibility   Staphylococcus epidermidis - MIC*    CIPROFLOXACIN 4 RESISTANT Resistant     ERYTHROMYCIN >=8 RESISTANT Resistant     GENTAMICIN <=0.5 SENSITIVE Sensitive     OXACILLIN >=4 RESISTANT Resistant     TETRACYCLINE >=16 RESISTANT Resistant     VANCOMYCIN 2 SENSITIVE Sensitive     TRIMETH/SULFA <=10 SENSITIVE Sensitive     CLINDAMYCIN >=8 RESISTANT Resistant     RIFAMPIN <=0.5 SENSITIVE Sensitive     Inducible Clindamycin NEGATIVE Sensitive     * STAPHYLOCOCCUS EPIDERMIDIS  Culture, blood (Routine X 2) w Reflex to ID Panel     Status: None (Preliminary result)   Collection Time: 12/20/16 12:15 AM  Result Value Ref Range Status   Specimen Description BLOOD RIGHT HAND  Final   Special Requests IN PEDIATRIC BOTTLE Blood Culture adequate volume  Final   Culture NO GROWTH 4 DAYS  Final   Report Status PENDING  Incomplete  Culture, blood (Routine X 2) w Reflex to ID Panel     Status: None (Preliminary result)   Collection Time: 12/22/16  4:21 PM  Result Value Ref Range Status   Specimen Description BLOOD RIGHT HAND  Final   Special Requests IN PEDIATRIC BOTTLE Blood Culture adequate volume  Final   Culture NO GROWTH 2 DAYS  Final   Report Status PENDING  Incomplete      Radiology Studies: Ct Thoracic Spine Wo Contrast  Result Date: 12/22/2016 CLINICAL DATA:  Initial evaluation for back pain localized between the shoulder blades for several days. History of remote lumbar surgery. EXAM: CT THORACIC AND LUMBAR SPINE WITHOUT CONTRAST TECHNIQUE: Multidetector CT imaging of the thoracic and lumbar spine was performed without contrast. Multiplanar CT image reconstructions were also generated. COMPARISON:  Comparison made with previous radiograph from 08/21/2015. FINDINGS: CT THORACIC SPINE FINDINGS Alignment: 3 mm  anterolisthesis of C7 on T1, likely due to chronic facet degeneration. Vertebral bodies otherwise normally aligned with preservation of the normal thoracic kyphosis. Vertebrae: Vertebral body heights well maintained. No evidence for acute or chronic fracture. No discrete worrisome lytic or blastic osseous lesions. Paraspinal and other soft tissues: Paraspinous soft tissues demonstrate no acute abnormality. Small layering bilateral pleural effusions with associated atelectasis. Irregular nodule measuring 16 x 11 mm at the posterior aspect of the right upper lobe (series 5, image 47). Cardiomegaly partially visualized. Large hepatic cysts with sequelae of prior cholecystectomy and probable pneumobilia partially visualized as well. Disc levels: Degenerative spondylolysis noted within the visualized lower cervical spine mild-to-moderate right C6-7 and C7-T1 foraminal stenosis, partially visualized. No significant canal narrowing. T1-2:  Negative disc.  Minimal facet hypertrophy.  No stenosis. T2-3: Negative disc. Mild facet hypertrophy. No significant stenosis. T3-4:  Negative disc.  Mild facet hypertrophy.  No stenosis. T4-5: Degenerative intervertebral disc space narrowing with reactive endplate changes. Bilateral facet arthrosis. No significant canal stenosis. Mild bilateral bony foraminal narrowing. T5-6: Degenerative intervertebral disc space narrowing with probable mild disc bulge. Bilateral facet arthrosis. No canal stenosis. Mild bilateral bony foraminal narrowing,  greater on the left. T6-7: Degenerative intervertebral disc space narrowing with reactive endplate changes and endplate osteophytic spurring. Mild facet degeneration. Mild left bony foraminal narrowing. No significant canal stenosis. T7-8: Mild disc bulge with bony endplate spurring. Mild facet hypertrophy. No stenosis. T8-9: Mild disc bulge with intervertebral disc space narrowing and endplate osteophytosis. Mild facet hypertrophy. No significant  stenosis. T9-10: Degenerative intervertebral disc space narrowing with endplate sclerosis and osteophytosis. Minimal disc bulge. No canal stenosis. Mild bilateral foraminal narrowing, left greater than right. T10-11:  Minimal disc bulge.  No stenosis. T11-12:  Mild facet hypertrophy.  No stenosis. T12-L1:  Bilateral facet arthrosis.  No stenosis. CT LUMBAR SPINE FINDINGS Segmentation: Normal segmentation. Lowest well-formed disc labeled the L5-S1 level. Alignment: 5 mm anterolisthesis of L4 on L5. Vertebral bodies otherwise normally aligned with preservation of the normal lumbar lordosis. Vertebrae: Vertebral body heights maintained. No evidence for acute or chronic fracture. No discrete lytic or blastic osseous lesions. Visualized sacrum and pelvis intact and unremarkable. Paraspinal and other soft tissues: Paraspinous soft tissues within normal limits. Hepatic cysts with pneumobilia and post cholecystectomy changes noted. Enteric catheter partially visualized. Disc levels: L1-2: Right eccentric disc bulge. Mild facet hypertrophy. No significant canal stenosis. Bulging disc closely approximates the exiting right L1 nerve root without frank impingement. No significant foraminal encroachment. L2-3: Diffuse degenerative disc bulge. Moderate facet arthrosis with ligamentum flavum hypertrophy. Mild canal with moderate subarticular stenosis. Mild left foraminal narrowing. L3-4: Diffuse disc bulge with disc desiccation. Advanced facet arthropathy with ligamentum flavum thickening. Resultant severe canal and bilateral subarticular stenosis. Moderate bilateral foraminal stenosis, right worse than left. L4-5: 5 mm anterolisthesis. Chronic degenerative intervertebral disc space narrowing with disc desiccation and diffuse disc bulge. A advanced bilateral facet arthrosis. Patient is status post decompressive laminectomy at this level. No definite residual canal stenosis, although there is suspected residual and least moderate  bilateral subarticular narrowing. Severe right with moderate left foraminal stenosis. L5-S1: Diffuse degenerative disc bulge with disc desiccation and intervertebral disc space narrowing. Associated chronic reactive endplate changes with marginal endplate osteophytosis. Moderate bilateral facet arthrosis left greater than right. Patient status post remote decompressive laminectomy. No significant canal stenosis or definite neural impingement. Moderate bilateral L5 foraminal narrowing. IMPRESSION: CT THORACIC SPINE IMPRESSION 1. No acute abnormality within the thoracic spine. 2. 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 Changes most notable at the T6-7 and T9-10 levels. No significant canal stenosis. 3. Irregular 16 x 11 mm nodule within the posterior right upper lobe, increased in size relative to recent CT, and suspected to be either infectious or inflammatory nature. Follow-up chest CT in 3 months as previously recommended. 4. Small layering bilateral pleural effusions with associated atelectasis. CT LUMBAR SPINE IMPRESSION 1. No acute abnormality within the lumbar spine. 2. 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 3. 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 4. Postoperative changes from remote decompressive laminectomy at L4-5 and L5-S1. 5. Fairly advanced bilateral facet arthropathy at L3-4 through L5-S1. Electronically Signed   By: Jeannine Boga M.D.   On: 12/22/2016 21:17   Ct Lumbar Spine Wo Contrast  Result Date: 12/22/2016 CLINICAL DATA:  Initial evaluation for back pain localized between the shoulder blades for several days. History of remote lumbar  surgery.  EXAM: CT THORACIC AND LUMBAR SPINE WITHOUT CONTRAST TECHNIQUE: Multidetector CT imaging of the thoracic and lumbar spine was performed without contrast. Multiplanar CT image reconstructions were also generated. COMPARISON:  Comparison made with previous radiograph from 08/21/2015. FINDINGS: CT THORACIC SPINE FINDINGS Alignment: 3 mm anterolisthesis of C7 on T1, likely due to chronic facet degeneration. Vertebral bodies otherwise normally aligned with preservation of the normal thoracic kyphosis. Vertebrae: Vertebral body heights well maintained. No evidence for acute or chronic fracture. No discrete worrisome lytic or blastic osseous lesions. Paraspinal and other soft tissues: Paraspinous soft tissues demonstrate no acute abnormality. Small layering bilateral pleural effusions with associated atelectasis. Irregular nodule measuring 16 x 11 mm at the posterior aspect of the right upper lobe (series 5, image 47). Cardiomegaly partially visualized. Large hepatic cysts with sequelae of prior cholecystectomy and probable pneumobilia partially visualized as well. Disc levels: Degenerative spondylolysis noted within the visualized lower cervical spine mild-to-moderate right C6-7 and C7-T1 foraminal stenosis, partially visualized. No significant canal narrowing. T1-2:  Negative disc.  Minimal facet hypertrophy.  No stenosis. T2-3: Negative disc. Mild facet hypertrophy. No significant stenosis. T3-4:  Negative disc.  Mild facet hypertrophy.  No stenosis. T4-5: Degenerative intervertebral disc space narrowing with reactive endplate changes. Bilateral facet arthrosis. No significant canal stenosis. Mild bilateral bony foraminal narrowing. T5-6: Degenerative intervertebral disc space narrowing with probable mild disc bulge. Bilateral facet arthrosis. No canal stenosis. Mild bilateral bony foraminal narrowing, greater on the left. T6-7: Degenerative intervertebral disc space narrowing with reactive endplate changes and endplate  osteophytic spurring. Mild facet degeneration. Mild left bony foraminal narrowing. No significant canal stenosis. T7-8: Mild disc bulge with bony endplate spurring. Mild facet hypertrophy. No stenosis. T8-9: Mild disc bulge with intervertebral disc space narrowing and endplate osteophytosis. Mild facet hypertrophy. No significant stenosis. T9-10: Degenerative intervertebral disc space narrowing with endplate sclerosis and osteophytosis. Minimal disc bulge. No canal stenosis. Mild bilateral foraminal narrowing, left greater than right. T10-11:  Minimal disc bulge.  No stenosis. T11-12:  Mild facet hypertrophy.  No stenosis. T12-L1:  Bilateral facet arthrosis.  No stenosis. CT LUMBAR SPINE FINDINGS Segmentation: Normal segmentation. Lowest well-formed disc labeled the L5-S1 level. Alignment: 5 mm anterolisthesis of L4 on L5. Vertebral bodies otherwise normally aligned with preservation of the normal lumbar lordosis. Vertebrae: Vertebral body heights maintained. No evidence for acute or chronic fracture. No discrete lytic or blastic osseous lesions. Visualized sacrum and pelvis intact and unremarkable. Paraspinal and other soft tissues: Paraspinous soft tissues within normal limits. Hepatic cysts with pneumobilia and post cholecystectomy changes noted. Enteric catheter partially visualized. Disc levels: L1-2: Right eccentric disc bulge. Mild facet hypertrophy. No significant canal stenosis. Bulging disc closely approximates the exiting right L1 nerve root without frank impingement. No significant foraminal encroachment. L2-3: Diffuse degenerative disc bulge. Moderate facet arthrosis with ligamentum flavum hypertrophy. Mild canal with moderate subarticular stenosis. Mild left foraminal narrowing. L3-4: Diffuse disc bulge with disc desiccation. Advanced facet arthropathy with ligamentum flavum thickening. Resultant severe canal and bilateral subarticular stenosis. Moderate bilateral foraminal stenosis, right worse than  left. L4-5: 5 mm anterolisthesis. Chronic degenerative intervertebral disc space narrowing with disc desiccation and diffuse disc bulge. A advanced bilateral facet arthrosis. Patient is status post decompressive laminectomy at this level. No definite residual canal stenosis, although there is suspected residual and least moderate bilateral subarticular narrowing. Severe right with moderate left foraminal stenosis. L5-S1: Diffuse degenerative disc bulge with disc desiccation and intervertebral disc space narrowing. Associated chronic reactive endplate changes with  marginal endplate osteophytosis. Moderate bilateral facet arthrosis left greater than right. Patient status post remote decompressive laminectomy. No significant canal stenosis or definite neural impingement. Moderate bilateral L5 foraminal narrowing. IMPRESSION: CT THORACIC SPINE IMPRESSION 1. No acute abnormality within the thoracic spine. 2. 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 Changes most notable at the T6-7 and T9-10 levels. No significant canal stenosis. 3. Irregular 16 x 11 mm nodule within the posterior right upper lobe, increased in size relative to recent CT, and suspected to be either infectious or inflammatory nature. Follow-up chest CT in 3 months as previously recommended. 4. Small layering bilateral pleural effusions with associated atelectasis. CT LUMBAR SPINE IMPRESSION 1. No acute abnormality within the lumbar spine. 2. 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 3. 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 4. Postoperative changes from remote decompressive laminectomy at L4-5 and L5-S1. 5. Fairly  advanced bilateral facet arthropathy at L3-4 through L5-S1. Electronically Signed   By: Jeannine Boga M.D.   On: 12/22/2016 21:17      Scheduled Meds: . aspirin EC  81 mg Oral Daily  . enoxaparin (LOVENOX) injection  40 mg Subcutaneous Q24H  . feeding supplement (ENSURE ENLIVE)  237 mL Oral TID BM  . felodipine  5 mg Oral Daily  . guaiFENesin  600 mg Oral BID  . insulin aspart  0-10 Units Subcutaneous TID WC  . lipase/protease/amylase  36,000 Units Oral TID WC  . pantoprazole  40 mg Oral BID   Continuous Infusions: . methocarbamol (ROBAXIN)  IV 500 mg (12/21/16 1736)  . vancomycin Stopped (12/24/16 1258)     LOS: 6 days    Time spent: Total of 25 minutes spent with pt, greater than 50% of which was spent in discussion of  treatment, counseling and coordination of care  Chipper Oman, MD Pager: Text Page via [email or web address]   If 7PM-7AM, please contact night-coverage [email or web address] 12/24/2016, 5:08 PM

## 2016-12-25 LAB — CULTURE, BLOOD (ROUTINE X 2)
Culture: NO GROWTH
SPECIAL REQUESTS: ADEQUATE

## 2016-12-25 LAB — BASIC METABOLIC PANEL
ANION GAP: 11 (ref 5–15)
BUN: 11 mg/dL (ref 6–20)
CHLORIDE: 100 mmol/L — AB (ref 101–111)
CO2: 19 mmol/L — AB (ref 22–32)
Calcium: 8.4 mg/dL — ABNORMAL LOW (ref 8.9–10.3)
Creatinine, Ser: 0.64 mg/dL (ref 0.44–1.00)
GFR calc non Af Amer: >60 mL/min (ref >60)
Glucose, Bld: 99 mg/dL (ref 65–99)
POTASSIUM: 4.4 mmol/L (ref 3.5–5.1)
SODIUM: 130 mmol/L — AB (ref 135–145)

## 2016-12-25 LAB — CBC
HEMATOCRIT: 30.9 % — AB (ref 36.0–46.0)
HEMOGLOBIN: 10.4 g/dL — AB (ref 12.0–15.0)
MCH: 29.5 pg (ref 26.0–34.0)
MCHC: 33.7 g/dL (ref 30.0–36.0)
MCV: 87.5 fL (ref 78.0–100.0)
Platelets: 337 10*3/uL (ref 150–400)
RBC: 3.53 MIL/uL — ABNORMAL LOW (ref 3.87–5.11)
RDW: 13.8 % (ref 11.5–15.5)
WBC: 6.6 10*3/uL (ref 4.0–10.5)

## 2016-12-25 LAB — GLUCOSE, CAPILLARY
GLUCOSE-CAPILLARY: 171 mg/dL — AB (ref 65–99)
GLUCOSE-CAPILLARY: 110 mg/dL — AB (ref 65–99)
Glucose-Capillary: 111 mg/dL — ABNORMAL HIGH (ref 65–99)

## 2016-12-25 MED ORDER — VANCOMYCIN IV (FOR PTA / DISCHARGE USE ONLY): 750.0000 mg | Freq: Two times a day (BID) | INTRAVENOUS | 0 refills | Status: DC

## 2016-12-25 MED ORDER — SODIUM CHLORIDE 0.9% FLUSH: 10.0000 mL | INTRAVENOUS | Status: DC | PRN

## 2016-12-25 NOTE — Care Management Note (Signed)
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  PCP:  Iona Beard   Action/Plan: Plan is to d/c to home today with home health services. [facility] start of care will begin today with 10pm with ABX infusion.  Expected Discharge Date:   12/26/2106           Expected Discharge Plan:  [place]  In-House Referral:     Discharge planning Services  CM Consult  Post Acute Care Choice:  Home Health Choice offered to:  Patient  DME Arranged:   IV pump /medication(ABX) DME Agency:   [facility]  HH Arranged:  RN [facility] Agency:  [place]  Status of Service:  Completed, signed off  If discussed at [place] of Stay Meetings, dates discussed:    Additional Comments:  Sharin Mons, RN 12/25/2016, 4:50 PM

## 2016-12-25 NOTE — Discharge Summary (Signed)
Physician Discharge Summary  Rhonda Spencer  XLK:[id number]  DOB: 21-Oct-1940  DOA: 12/17/2016 PCP: Iona Beard, MD  Admit date: 12/17/2016 Discharge date: 12/25/2016  Admitted From: Home  Disposition:  Home  Recommendations for Outpatient Follow-up:  1. Follow up with PCP in 1 week  2. Please obtain BMP/CBC in one week, monitor Vanc through every 3 dose  3. Check blood cultures after completion of abx  4. Do not remove PICC line until blood cultures negative x 48 hrs from completion of abx  5. Follow up CT chest in 3 month to follow up reticular nodule.   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 Upon ED evaluation she was found to have HCAP and UTI, and blood cultures staph epi was found. PICC line was taken and on repeated blood culture should staph epi again. ID was consulted and ordered an echocardiogram which was consistent with an aortic valve vegetation and endocarditis. ID advised to treat with 2 weeks of IV vancomycin. PICC line was placed,   Subjective: Patient seen and examined on the day of discharge, have no complains   Discharge Diagnoses/Hospital Course:  Sepsis secondary to healthcare associated pneumonia and urinary tract infection. 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  TEE has been deferred, patient is advised to have a course of vancomycin for 2 weeks. Patient was  advised to follow up, with PCP after completion of abx   Adenocarcinoma of the pancreas status post pancreaticoduodenectomy - stable  Continue pancreas enzymes She was on TPN for almost a month but she is able to tolerate soft diet. Case was discussed with Dr. Barry Dienes recommended to discontinue TPN for now and recommended low-fat diet as tolerated.   Hypertension BP well-controlled Continue current management  Diabetes mellitus CBGs stable Continue home medications  A1c 7.1  Back spasms persistent back pain; unclear etiology. She reports persistent back pain, even with pain meds, in view of her recent pancretic cancer CT lumbar spine and thoracic spine was done. 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 Recommend PT and pain medication for now.   Abnormal CT ; New reticulonodular opacity in the right upper lobe is likely infectious or inflammatory. Small subpleural 8 x 4 mm nodular opacity in the right upper lobe was also new, nonspecific. Recommend short-term follow-up CT in 3 months to evaluate for resolution  All other chronic medical condition were stable during the hospitalization.  On the day of the discharge the patient's vitals were stable, and no other acute medical condition were reported by patient. Patient was felt safe to be discharge to home   Discharge Instructions  You were cared for by a hospitalist during your hospital stay. If you have any questions about your discharge medications or the care you received while you were in  the hospital after you are discharged, you can call the unit and asked to speak with the hospitalist on call if the hospitalist that took care of you is not available. Once you are discharged, your primary care physician will handle  any further medical issues. Please note that NO REFILLS for any discharge medications will be authorized once you are discharged, as it is imperative that you return to your primary care physician (or establish a relationship with a primary care physician if you do not have one) for your aftercare needs so that they can reassess your need for medications and monitor your lab values.  Discharge Instructions    AMB Referral to [place] [facility]    Complete by:  As directed    Reason for consult:  Post hospital follow up - re-admission Next Gen/Medicare   Expected date of contact:  1-3 days (reserved for hospital discharges)   Patient just discharge from skilled facility to return home.  Please assign to community nurse for transition of care calls and assess for home visits. Daughter, Sharlena Kristensen and son are contacts Questions please call:   Natividad Brood, RN BSN [facility] Liaison  [phone] business mobile phone Toll free office [phone]   Call MD for:  difficulty breathing, headache or visual disturbances    Complete by:  As directed    Call MD for:  extreme fatigue    Complete by:  As directed    Call MD for:  hives    Complete by:  As directed    Call MD for:  persistant dizziness or light-headedness    Complete by:  As directed    Call MD for:  persistant nausea and vomiting    Complete by:  As directed    Call MD for:  redness, tenderness, or signs of infection (pain, swelling, redness, odor or green/yellow discharge around incision site)    Complete by:  As directed    Call MD for:  severe uncontrolled pain    Complete by:  As directed    Call MD for:  temperature >100.4    Complete by:  As directed    Diet - low sodium heart healthy    Complete by:  As directed    Low fat diet   Home infusion instructions [facility] May follow [place] Dosing Protocol; May administer Cathflo as needed to maintain patency of vascular access device.;  Flushing of vascular access device: per [facility] Protocol: 0.9% NaCl pre/post medica...    Complete by:  As directed    Instructions:  May follow [place] Dosing Protocol   Instructions:  May administer Cathflo as needed to maintain patency of vascular access device.   Instructions:  Flushing of vascular access device: per [facility] Protocol: 0.9% NaCl pre/post medication administration and prn patency; Heparin 100 u/ml, 62m for implanted ports and Heparin 10u/ml, 54mfor all other central venous catheters.   Instructions:  May follow [facility] Anaphylaxis Protocol for First Dose Administration in the home: 0.9% NaCl at 25-50 ml/hr to maintain IV access for protocol meds. Epinephrine 0.3 ml IV/IM PRN and Benadryl 25-50 IV/IM PRN s/s of anaphylaxis.   Instructions:  [facility] Coord[facility] (RN) to assist per patient IV care needs in the home PRN.   Increase activity slowly    Complete by:  As directed      Allergies as of 12/25/2016      Reactions   Tarka [trandolapril-verapamil Hcl Er] Swelling  Slowed heart rate Pt reports allergy to any cardiac medication ending in "-il"   Atorvastatin Other (See Comments)   Stated that it lowered her heart rate   Contrast Media [iodinated Diagnostic Agents] Itching      Medication List    STOP taking these medications   dextrose 10 % infusion   Magnesium 250 MG Tabs   tpn solution (CLINIMIX 5/15) *NO Electrolytes* 5 % Soln     TAKE these medications   acetaminophen 325 MG tablet Commonly known as:  TYLENOL Take 2 tablets (650 mg total) by mouth every 4 (four) hours as needed for mild pain, moderate pain, fever or headache. What changed:  reasons to take this   aspirin EC 81 MG tablet Take 81 mg by mouth daily.   betamethasone dipropionate 0.05 % cream Commonly known as:  DIPROLENE Apply 1 application topically daily as needed (for itching/TO AFFECTED AREAS).   bisacodyl 10 MG suppository Commonly known as:  DULCOLAX Place 1  suppository (10 mg total) rectally daily as needed for moderate constipation. What changed:  reasons to take this   felodipine 5 MG 24 hr tablet Commonly known as:  PLENDIL Take 5 mg by mouth daily.   insulin aspart 100 UNIT/ML injection Commonly known as:  novoLOG Inject 0-15 Units into the skin 3 (three) times daily with meals. What changed:  how much to take  additional instructions  Another medication with the same name was removed. Continue taking this medication, and follow the directions you see here.   ketotifen 0.025 % ophthalmic solution Commonly known as:  ZADITOR Place 1 drop into the left eye every 8 (eight) hours as needed (FOR IRRITATION).   LEVEMIR FLEXPEN 100 UNIT/ML Pen Generic drug:  Insulin Detemir Inject 10 Units into the skin 2 (two) times daily.   lidocaine 5 % Commonly known as:  LIDODERM Place 1 patch onto the skin daily as needed (FOR PAIN). Remove & Discard patch within 12 hours or as directed by MD   lipase/protease/amylase 36000 UNITS Cpep capsule Commonly known as:  CREON Take 1 capsule (36,000 Units total) by mouth 3 (three) times daily with meals.   oxyCODONE 5 MG immediate release tablet Commonly known as:  Oxy IR/ROXICODONE Take 5 mg by mouth every 4 (four) hours as needed ("FOR PAIN FOR 3 DAYS").   pantoprazole 40 MG tablet Commonly known as:  PROTONIX Take 1 tablet (40 mg total) by mouth 2 (two) times daily.   prochlorperazine 10 MG tablet Commonly known as:  COMPAZINE Take 1 tablet (10 mg total) by mouth every 6 (six) hours as needed for nausea or vomiting (Use for nausea and / or vomiting unresolved with ondansetron (Zofran).). What changed:  reasons to take this   vancomycin IVPB Inject 750 mg into the vein every 12 (twelve) hours. Indication:  bacteremia Last Day of Therapy:  01/04/17 Labs - Sunday/Monday:  CBC/D, BMP, and vancomycin trough. Labs - Thursday:  BMP and vancomycin trough Labs - Every other week:  ESR and CRP             Home Infusion Instuctions        Start     Ordered   12/25/16 0000  Home infusion instructions [facility] May follow [place] Dosing Protocol; May administer Cathflo as needed to maintain patency of vascular access device.; Flushing of vascular access device: per [facility] Protocol: 0.9% NaCl pre/post medica...    Question Answer Comment  Instructions May follow [place]  Protocol   Instructions May administer Cathflo as needed to maintain patency of vascular access device.   Instructions Flushing of vascular access device: per [facility] Protocol: 0.9% NaCl pre/post medication administration and prn patency; Heparin 100 u/ml, 68m for implanted ports and Heparin 10u/ml, 586mfor all other central venous catheters.   Instructions May follow [facility] Anaphylaxis Protocol for First Dose Administration in the home: 0.9% NaCl at 25-50 ml/hr to maintain IV access for protocol meds. Epinephrine 0.3 ml IV/IM PRN and Benadryl 25-50 IV/IM PRN s/s of anaphylaxis.   Instructions [facility] Infusion Coordinator (RN) to assist per patient IV care needs in the home PRN.      12/25/16 1517       Discharge Care Instructions        Start     Ordered   12/25/16 0000  Home infusion instructions [facility] May follow [facility] Protocol; May administer Cathflo as needed to maintain patency of vascular access device.; Fl[facility] vascular [facility]: 0.9% NaCl pre/post medica...    Question Answer Comment  Instructions May follow [facility] Protocol   Instructions May administer Cathflo as needed to maintain patency of vascular access device.   Instructions Flushing of vascular access device: [facility]: 0.9% NaCl pre/post medication administration and prn patency; Heparin 100 u/ml, 6m46mor implanted ports and Heparin 10u/ml, 6ml84mr all other central venous catheters.   Instructions May follow [facility] Anaphylaxis Protocol for First Dose  Administration in the home: 0.9% NaCl at 25-50 ml/hr to maintain IV access for protocol meds. Epinephrine 0.3 ml IV/IM PRN and Benadryl 25-50 IV/IM PRN s/s of anaphylaxis.   Instructions [facility] Infusion Coordinator (RN) to assist per patient IV care needs in the home PRN.      12/25/16 1517   12/25/16 0000  vancomycin IVPB  Every 12 hours     12/25/16 1517   12/25/16 0000  Increase activity slowly     12/25/16 1719   12/25/16 0000  Diet - low sodium heart healthy    Comments:  Low fat diet   12/25/16 1719   12/25/16 0000  Call MD for:  temperature >100.4     12/25/16 1719   12/25/16 0000  Call MD for:  persistant nausea and vomiting     12/25/16 1719   12/25/16 0000  Call MD for:  severe uncontrolled pain     12/25/16 1719   12/25/16 0000  Call MD for:  redness, tenderness, or signs of infection (pain, swelling, redness, odor or green/yellow discharge around incision site)     12/25/16 1719   12/25/16 0000  Call MD for:  difficulty breathing, headache or visual disturbances     12/25/16 1719   12/25/16 0000  Call MD for:  hives     12/25/16 1719   12/25/16 0000  Call MD for:  persistant dizziness or light-headedness     12/25/16 1719   12/25/16 0000  Call MD for:  extreme fatigue     12/25/16 1719   12/19/16 0000  [facility]    Question Answer Comment  Reason for consult Post hospital follow up - re-admission Next Gen/Medicare   Expected date of contact 1-3 days (reserved for hospital discharges)      12/19/16 1454     Follow-up Information    [facility] Follow up.   Why:  home health services arranged, start of  care to begin today @ 10pm Contact information: [place] [address] [phone]          Allergies  Allergen Reactions  . Tarka [Trandolapril-Verapamil Hcl Er] Swelling    Slowed heart rate Pt reports allergy to any cardiac medication ending in "-il"  . Atorvastatin Other (See  Comments)    Stated that it lowered her heart rate  . Contrast Media [Iodinated Diagnostic Agents] Itching    Consultations:  ID    Procedures/Studies: Dg Chest 2 View  Result Date: 12/17/2016 CLINICAL DATA:  76 y/o  F; PICC line verification.  Possible sepsis. EXAM: CHEST  2 VIEW COMPARISON:  10/21/2016 CT chest. FINDINGS: Stable normal cardiac silhouette. Right PICC line tip projects over the cavoatrial junction. Left lower lobe patchy consolidation. Right upper quadrant cholecystectomy clips. Bones are unremarkable. IMPRESSION: Left lower lobe patchy consolidation may represent pneumonia. Right PICC line tip projects over cavoatrial junction. Electronically Signed   By: Kristine Garbe M.D.   On: 12/17/2016 22:38   Dg Abd 1 View  Result Date: 11/30/2016 CLINICAL DATA:  Nausea and vomiting. Recent Whipple procedure. Pancreatic cancer. EXAM: ABDOMEN - 1 VIEW COMPARISON:  11/22/2016. FINDINGS: Surgical clips. Enteric tube LEFT lower quadrant. Lumbar spondylosis. No obstruction or visible free air. IMPRESSION: Negative study. Electronically Signed   By: Staci Righter M.D.   On: 11/30/2016 14:39   Ct Angio Chest Pe W And/or Wo Contrast  Result Date: 12/18/2016 CLINICAL DATA:  Chest and back pain. EXAM: CT ANGIOGRAPHY CHEST WITH CONTRAST TECHNIQUE: Multidetector CT imaging of the chest was performed using the standard protocol during bolus administration of intravenous contrast. Multiplanar CT image reconstructions and MIPs were obtained to evaluate the vascular anatomy. CONTRAST:  58 cc Isovue 370 IV COMPARISON:  Chest CT 10/21/2016 FINDINGS: Cardiovascular: There are no filling defects within the pulmonary arteries to suggest pulmonary embolus. Evaluation of the subsegmental right lower lobe pulmonary arteries is limited given motion and adjacent atelectasis. Ectasia of the ascending aorta measuring 4.2 cm, no evidence of dissection allowing for phase of contrast. There is an aberrant  right subclavian artery. Mild aortic atherosclerosis. Mild multi chamber cardiomegaly is new from prior exam. No pericardial effusion. Mediastinum/Nodes: No mediastinal or hilar adenopathy. The esophagus decompressed. Left thyroid nodule on prior exam not as well-defined currently. Right upper extremity PICC in place. Lungs/Pleura: Dependent lower lobe opacities with air bronchograms, favoring atelectasis. Tiny subpleural nodules in the right upper lobe (image 11 and 35) are unchanged from prior exam. Reticulonodular opacity in the right upper lobe is new (image 39). Linear 8 x 4 mm subpleural opacity in the right upper lobe image 25 is also new from prior. No pleural fluid. No pulmonary edema. Upper Abdomen: Multiple hepatic cysts. Musculoskeletal: Degenerative change in the spine without acute abnormality. Review of the MIP images confirms the above findings. IMPRESSION: 1. No pulmonary embolus. 2. New reticulonodular opacity in the right upper lobe is likely infectious or inflammatory. Small subpleural 8 x 4 mm nodular opacity in the right upper lobe was also new, nonspecific. Recommend short-term follow-up CT in 3 months to evaluate for resolution or change. 3. Bilateral lower lobe opacities with air bronchograms, favor atelectasis. 4. Mild cardiomegaly is new from prior CT. Unchanged ectasia of the ascending aorta 4.2 cm. Aortic Atherosclerosis (ICD10-I70.0). Aortic aneurysm NOS (ICD10-I71.9). Electronically Signed   By: Jeb Levering M.D.   On: 12/18/2016 07:08   Ct Thoracic Spine Wo Contrast  Result Date: 12/22/2016 CLINICAL DATA:  Initial evaluation for back pain localized between the shoulder blades for several days. History of remote lumbar surgery. EXAM: CT THORACIC AND LUMBAR SPINE WITHOUT CONTRAST TECHNIQUE: Multidetector CT imaging of the thoracic and lumbar spine was performed without contrast. Multiplanar CT image reconstructions were also generated. COMPARISON:  Comparison made with previous  radiograph from 08/21/2015. FINDINGS: CT THORACIC SPINE FINDINGS Alignment: 3 mm anterolisthesis of C7 on T1, likely due to chronic facet degeneration. Vertebral bodies otherwise normally aligned with preservation of the normal thoracic kyphosis. Vertebrae: Vertebral body heights well maintained. No evidence for acute or chronic fracture. No discrete worrisome lytic or blastic osseous lesions. Paraspinal and other soft tissues: Paraspinous soft tissues demonstrate no acute abnormality. Small layering bilateral pleural effusions with associated atelectasis. Irregular nodule measuring 16 x 11 mm at the posterior aspect of the right upper lobe (series 5, image 47). Cardiomegaly partially visualized. Large hepatic cysts with sequelae of prior cholecystectomy and probable pneumobilia partially visualized as well. Disc levels: Degenerative spondylolysis noted within the visualized lower cervical spine mild-to-moderate right C6-7 and C7-T1 foraminal stenosis, partially visualized. No significant canal narrowing. T1-2:  Negative disc.  Minimal facet hypertrophy.  No stenosis. T2-3: Negative disc. Mild facet hypertrophy. No significant stenosis. T3-4:  Negative disc.  Mild facet hypertrophy.  No stenosis. T4-5: Degenerative intervertebral disc space narrowing with reactive endplate changes. Bilateral facet arthrosis. No significant canal stenosis. Mild bilateral bony foraminal narrowing. T5-6: Degenerative intervertebral disc space narrowing with probable mild disc bulge. Bilateral facet arthrosis. No canal stenosis. Mild bilateral bony foraminal narrowing, greater on the left. T6-7: Degenerative intervertebral disc space narrowing with reactive endplate changes and endplate osteophytic spurring. Mild facet degeneration. Mild left bony foraminal narrowing. No significant canal stenosis. T7-8: Mild disc bulge with bony endplate spurring. Mild facet hypertrophy. No stenosis. T8-9: Mild disc bulge with intervertebral disc space  narrowing and endplate osteophytosis. Mild facet hypertrophy. No significant stenosis. T9-10: Degenerative intervertebral disc space narrowing with endplate sclerosis and osteophytosis. Minimal disc bulge. No canal stenosis. Mild bilateral foraminal narrowing, left greater than right. T10-11:  Minimal disc bulge.  No stenosis. T11-12:  Mild facet hypertrophy.  No stenosis. T12-L1:  Bilateral facet arthrosis.  No stenosis. CT LUMBAR SPINE FINDINGS Segmentation: Normal segmentation. Lowest well-formed disc labeled the L5-S1 level. Alignment: 5 mm anterolisthesis of L4 on L5. Vertebral bodies otherwise normally aligned with preservation of the normal lumbar lordosis. Vertebrae: Vertebral body heights maintained. No evidence for acute or chronic fracture. No discrete lytic or blastic osseous lesions. Visualized sacrum and pelvis intact and unremarkable. Paraspinal and other soft tissues: Paraspinous soft tissues within normal limits. Hepatic cysts with pneumobilia and post cholecystectomy changes noted. Enteric catheter partially visualized. Disc levels: L1-2: Right eccentric disc bulge. Mild facet hypertrophy. No significant canal stenosis. Bulging disc closely approximates the exiting right L1 nerve root without frank impingement. No significant foraminal encroachment. L2-3: Diffuse degenerative disc bulge. Moderate facet arthrosis with ligamentum flavum hypertrophy. Mild canal with moderate subarticular stenosis. Mild left foraminal narrowing. L3-4: Diffuse disc bulge with disc desiccation. Advanced facet arthropathy with ligamentum flavum thickening. Resultant severe canal and bilateral subarticular stenosis. Moderate bilateral foraminal stenosis, right worse than left. L4-5: 5 mm anterolisthesis. Chronic degenerative intervertebral disc space narrowing with disc desiccation and diffuse disc bulge. A advanced bilateral facet arthrosis. Patient is status post decompressive laminectomy at this level. No definite  residual canal stenosis, although there is suspected residual and least moderate bilateral subarticular narrowing. Severe right with moderate left foraminal stenosis. L5-S1: Diffuse  degenerative disc bulge with disc desiccation and intervertebral disc space narrowing. Associated chronic reactive endplate changes with marginal endplate osteophytosis. Moderate bilateral facet arthrosis left greater than right. Patient status post remote decompressive laminectomy. No significant canal stenosis or definite neural impingement. Moderate bilateral L5 foraminal narrowing. IMPRESSION: CT THORACIC SPINE IMPRESSION 1. No acute abnormality within the thoracic spine. 2. 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 Changes most notable at the T6-7 and T9-10 levels. No significant canal stenosis. 3. Irregular 16 x 11 mm nodule within the posterior right upper lobe, increased in size relative to recent CT, and suspected to be either infectious or inflammatory nature. Follow-up chest CT in 3 months as previously recommended. 4. Small layering bilateral pleural effusions with associated atelectasis. CT LUMBAR SPINE IMPRESSION 1. No acute abnormality within the lumbar spine. 2. 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 3. 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 4. Postoperative changes from remote decompressive laminectomy at L4-5 and L5-S1. 5. Fairly advanced bilateral facet arthropathy at L3-4 through L5-S1. Electronically Signed   By: Jeannine Boga M.D.   On: 12/22/2016 21:17   Ct Lumbar Spine Wo Contrast  Result Date: 12/22/2016 CLINICAL DATA:  Initial evaluation for back pain  localized between the shoulder blades for several days. History of remote lumbar surgery. EXAM: CT THORACIC AND LUMBAR SPINE WITHOUT CONTRAST TECHNIQUE: Multidetector CT imaging of the thoracic and lumbar spine was performed without contrast. Multiplanar CT image reconstructions were also generated. COMPARISON:  Comparison made with previous radiograph from 08/21/2015. FINDINGS: CT THORACIC SPINE FINDINGS Alignment: 3 mm anterolisthesis of C7 on T1, likely due to chronic facet degeneration. Vertebral bodies otherwise normally aligned with preservation of the normal thoracic kyphosis. Vertebrae: Vertebral body heights well maintained. No evidence for acute or chronic fracture. No discrete worrisome lytic or blastic osseous lesions. Paraspinal and other soft tissues: Paraspinous soft tissues demonstrate no acute abnormality. Small layering bilateral pleural effusions with associated atelectasis. Irregular nodule measuring 16 x 11 mm at the posterior aspect of the right upper lobe (series 5, image 47). Cardiomegaly partially visualized. Large hepatic cysts with sequelae of prior cholecystectomy and probable pneumobilia partially visualized as well. Disc levels: Degenerative spondylolysis noted within the visualized lower cervical spine mild-to-moderate right C6-7 and C7-T1 foraminal stenosis, partially visualized. No significant canal narrowing. T1-2:  Negative disc.  Minimal facet hypertrophy.  No stenosis. T2-3: Negative disc. Mild facet hypertrophy. No significant stenosis. T3-4:  Negative disc.  Mild facet hypertrophy.  No stenosis. T4-5: Degenerative intervertebral disc space narrowing with reactive endplate changes. Bilateral facet arthrosis. No significant canal stenosis. Mild bilateral bony foraminal narrowing. T5-6: Degenerative intervertebral disc space narrowing with probable mild disc bulge. Bilateral facet arthrosis. No canal stenosis. Mild bilateral bony foraminal narrowing, greater on the left. T6-7:  Degenerative intervertebral disc space narrowing with reactive endplate changes and endplate osteophytic spurring. Mild facet degeneration. Mild left bony foraminal narrowing. No significant canal stenosis. T7-8: Mild disc bulge with bony endplate spurring. Mild facet hypertrophy. No stenosis. T8-9: Mild disc bulge with intervertebral disc space narrowing and endplate osteophytosis. Mild facet hypertrophy. No significant stenosis. T9-10: Degenerative intervertebral disc space narrowing with endplate sclerosis and osteophytosis. Minimal disc bulge. No canal stenosis. Mild bilateral foraminal narrowing, left greater  than right. T10-11:  Minimal disc bulge.  No stenosis. T11-12:  Mild facet hypertrophy.  No stenosis. T12-L1:  Bilateral facet arthrosis.  No stenosis. CT LUMBAR SPINE FINDINGS Segmentation: Normal segmentation. Lowest well-formed disc labeled the L5-S1 level. Alignment: 5 mm anterolisthesis of L4 on L5. Vertebral bodies otherwise normally aligned with preservation of the normal lumbar lordosis. Vertebrae: Vertebral body heights maintained. No evidence for acute or chronic fracture. No discrete lytic or blastic osseous lesions. Visualized sacrum and pelvis intact and unremarkable. Paraspinal and other soft tissues: Paraspinous soft tissues within normal limits. Hepatic cysts with pneumobilia and post cholecystectomy changes noted. Enteric catheter partially visualized. Disc levels: L1-2: Right eccentric disc bulge. Mild facet hypertrophy. No significant canal stenosis. Bulging disc closely approximates the exiting right L1 nerve root without frank impingement. No significant foraminal encroachment. L2-3: Diffuse degenerative disc bulge. Moderate facet arthrosis with ligamentum flavum hypertrophy. Mild canal with moderate subarticular stenosis. Mild left foraminal narrowing. L3-4: Diffuse disc bulge with disc desiccation. Advanced facet arthropathy with ligamentum flavum thickening. Resultant severe canal  and bilateral subarticular stenosis. Moderate bilateral foraminal stenosis, right worse than left. L4-5: 5 mm anterolisthesis. Chronic degenerative intervertebral disc space narrowing with disc desiccation and diffuse disc bulge. A advanced bilateral facet arthrosis. Patient is status post decompressive laminectomy at this level. No definite residual canal stenosis, although there is suspected residual and least moderate bilateral subarticular narrowing. Severe right with moderate left foraminal stenosis. L5-S1: Diffuse degenerative disc bulge with disc desiccation and intervertebral disc space narrowing. Associated chronic reactive endplate changes with marginal endplate osteophytosis. Moderate bilateral facet arthrosis left greater than right. Patient status post remote decompressive laminectomy. No significant canal stenosis or definite neural impingement. Moderate bilateral L5 foraminal narrowing. IMPRESSION: CT THORACIC SPINE IMPRESSION 1. No acute abnormality within the thoracic spine. 2. 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 Changes most notable at the T6-7 and T9-10 levels. No significant canal stenosis. 3. Irregular 16 x 11 mm nodule within the posterior right upper lobe, increased in size relative to recent CT, and suspected to be either infectious or inflammatory nature. Follow-up chest CT in 3 months as previously recommended. 4. Small layering bilateral pleural effusions with associated atelectasis. CT LUMBAR SPINE IMPRESSION 1. No acute abnormality within the lumbar spine. 2. 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 3. 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  4. Postoperative changes from remote decompressive laminectomy at L4-5 and L5-S1. 5. Fairly advanced bilateral facet arthropathy at L3-4 through L5-S1. Electronically Signed   By: Jeannine Boga M.D.   On: 12/22/2016 21:17  ECHO  ------------------------------------------------------------------- Study Conclusions  - Left ventricle: The cavity size was normal. Wall thickness was   increased in a pattern of mild LVH. There was moderate focal   basal hypertrophy of the septum. Systolic function was normal.   The estimated ejection fraction was in the range of 60% to 65%.   Doppler parameters are consistent with abnormal left ventricular   relaxation (grade 1 diastolic dysfunction). - Aortic valve: Trileaflet. Sclerosis without stenosis. There is a   possible mobile filamentous structure on the outflow side of the   aortic valve (seen in image 17 and 22), which could be consistent   with vegetation. Mild to  moderate regurgitation. - Mitral valve: Mildly thickened leaflets . There was mild   regurgitation. - Left atrium: The atrium was normal in size. - Tricuspid valve: There was moderate regurgitation. - Pulmonary arteries: PA peak pressure: 40 mm Hg (S). - Inferior vena cava: The vessel was normal in size. The   respirophasic diameter changes were in the normal range (>= 50%),   consistent with normal central venous pressure. - Pericardium, extracardiac: There was no pericardial effusion.  Impressions:  - Compared to a prior study in 10/2016, the LVEF is higher. 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 There is a suggestion of a   filmatenous structure associated with the outflow tract of the   aortic valve which may represent vegetation in a patient with   endocarditis. Recommend TEE for better visualization of the   aortic valve.    Discharge Exam: Vitals:   12/25/16 0613 12/25/16 1455  BP: (!) 120/56 (!) 137/59  Pulse: 89 98  Resp: 19 18  Temp: 98.8 F (37.1 C)  98.3 F (36.8 C)  SpO2: 100% 98%   Vitals:   12/24/16 1300 12/24/16 2131 12/25/16 0613 12/25/16 1455  BP: 136/61 (!) 148/67 (!) 120/56 (!) 137/59  Pulse: 84 95 89 98  Resp: _0 Temp: 98.5 F (36.9 C) 98.6 F (37 C) 98.8 F (37.1 C) 98.3 F (36.8 C)  TempSrc: Oral Oral Oral   SpO2: 100% 100% 100% 98%  Weight:      Height:        General: Pt is alert, awake, not in acute distress Cardiovascular: RRR, S1/S2 +, no rubs, no gallops Respiratory: CTA bilaterally, no wheezing, no rhonchi Abdominal: Soft, NT, ND, bowel sounds + Extremities: no edema, no cyanosis   The results of significant diagnostics from this hospitalization (including imaging, microbiology, ancillary and laboratory) are listed below for reference.     Microbiology: Recent Results (from the past 240 hour(s))  Urine culture     Status: Abnormal   Collection Time: 12/17/16 11:45 PM  Result Value Ref Range Status   Specimen Description URINE, CLEAN CATCH  Final   Special Requests Normal  Final   Culture (A)  Final    >=100,000 COLONIES/mL STAPHYLOCOCCUS SPECIES (COAGULASE NEGATIVE)   Report Status 12/20/2016 FINAL  Final   Organism ID, Bacteria STAPHYLOCOCCUS SPECIES (COAGULASE NEGATIVE) (A)  Final      Susceptibility   Staphylococcus species (coagulase negative) - MIC*    CIPROFLOXACIN 4 RESISTANT Resistant     GENTAMICIN <=0.5 SENSITIVE Sensitive     NITROFURANTOIN <=16 SENSITIVE Sensitive     OXACILLIN >=4 RESISTANT Resistant     TETRACYCLINE >=16 RESISTANT Resistant     VANCOMYCIN 2 SENSITIVE Sensitive     TRIMETH/SULFA <=10 SENSITIVE Sensitive     CLINDAMYCIN >=8 RESISTANT Resistant     RIFAMPIN <=0.5 SENSITIVE Sensitive     Inducible Clindamycin NEGATIVE Sensitive     * >=100,000 COLONIES/mL STAPHYLOCOCCUS SPECIES (COAGULASE NEGATIVE)  Culture, blood (Routine x 2)     Status: Abnormal   Collection Time: 12/18/16 12:00 AM  Result Value Ref Range Status   Specimen Description BLOOD RIGHT  HAND RIGHT THUMB  Final   Special Requests   Final    BOTTLES DRAWN AEROBIC AND ANAEROBIC Blood Culture adequate volume   Culture  Setup Time   Final    GRAM POSITIVE COCCI IN CLUSTERS IN BOTH AEROBIC AND ANAEROBIC BOTTLES T.DANG PHARMD 12/19/16 0052 L.CHAMPION  Culture (A)  Final    STAPHYLOCOCCUS EPIDERMIDIS SUSCEPTIBILITIES PERFORMED ON PREVIOUS CULTURE WITHIN THE LAST 5 DAYS.    Report Status 12/22/2016 FINAL  Final  Blood Culture ID Panel (Reflexed)     Status: Abnormal   Collection Time: 12/18/16 12:00 AM  Result Value Ref Range Status   Enterococcus species NOT DETECTED NOT DETECTED Final   Vancomycin resistance NOT DETECTED NOT DETECTED Final   Listeria monocytogenes NOT DETECTED NOT DETECTED Final   Staphylococcus species DETECTED (A) NOT DETECTED Final    Comment: CRITICAL RESULT CALLED TO, READ BACK BY AND VERIFIED WITH: T.DANG PHARMD 12/19/16 0052 L.CHAMPION    Staphylococcus aureus NOT DETECTED NOT DETECTED Final   Methicillin resistance DETECTED (A) NOT DETECTED Final    Comment: CRITICAL RESULT CALLED TO, READ BACK BY AND VERIFIED WITH: T.DANG PHARMD 12/19/16 0052 L.CHAMPION    Streptococcus species NOT DETECTED NOT DETECTED Final   Streptococcus agalactiae NOT DETECTED NOT DETECTED Final   Streptococcus pneumoniae NOT DETECTED NOT DETECTED Final   Streptococcus pyogenes NOT DETECTED NOT DETECTED Final   Acinetobacter baumannii NOT DETECTED NOT DETECTED Final   Enterobacteriaceae species NOT DETECTED NOT DETECTED Final   Enterobacter cloacae complex NOT DETECTED NOT DETECTED Final   Escherichia coli NOT DETECTED NOT DETECTED Final   Klebsiella oxytoca NOT DETECTED NOT DETECTED Final   Klebsiella pneumoniae NOT DETECTED NOT DETECTED Final   Proteus species NOT DETECTED NOT DETECTED Final   Serratia marcescens NOT DETECTED NOT DETECTED Final   Carbapenem resistance NOT DETECTED NOT DETECTED Final   Haemophilus influenzae NOT DETECTED NOT DETECTED Final    Neisseria meningitidis NOT DETECTED NOT DETECTED Final   Pseudomonas aeruginosa NOT DETECTED NOT DETECTED Final   Candida albicans NOT DETECTED NOT DETECTED Final   Candida glabrata NOT DETECTED NOT DETECTED Final   Candida krusei NOT DETECTED NOT DETECTED Final   Candida parapsilosis NOT DETECTED NOT DETECTED Final   Candida tropicalis NOT DETECTED NOT DETECTED Final  Culture, blood (Routine x 2)     Status: None   Collection Time: 12/18/16 12:25 AM  Result Value Ref Range Status   Specimen Description BLOOD RIGHT HAND  Final   Special Requests IN PEDIATRIC BOTTLE Blood Culture adequate volume  Final   Culture NO GROWTH 5 DAYS  Final   Report Status 12/23/2016 FINAL  Final  Culture, blood (Routine X 2) w Reflex to ID Panel     Status: Abnormal   Collection Time: 12/20/16 12:15 AM  Result Value Ref Range Status   Specimen Description BLOOD RIGHT ARM  Final   Special Requests IN PEDIATRIC BOTTLE Blood Culture adequate volume  Final   Culture  Setup Time   Final    GRAM POSITIVE COCCI IN CLUSTERS IN PEDIATRIC BOTTLE CRITICAL RESULT CALLED TO, READ BACK BY AND VERIFIED WITH: L POWELL,PHARMD AT 2044 12/20/16 BY L BENFIELD    Culture STAPHYLOCOCCUS EPIDERMIDIS (A)  Final   Report Status 12/22/2016 FINAL  Final   Organism ID, Bacteria STAPHYLOCOCCUS EPIDERMIDIS  Final      Susceptibility   Staphylococcus epidermidis - MIC*    CIPROFLOXACIN 4 RESISTANT Resistant     ERYTHROMYCIN >=8 RESISTANT Resistant     GENTAMICIN <=0.5 SENSITIVE Sensitive     OXACILLIN >=4 RESISTANT Resistant     TETRACYCLINE >=16 RESISTANT Resistant     VANCOMYCIN 2 SENSITIVE Sensitive     TRIMETH/SULFA <=10 SENSITIVE Sensitive     CLINDAMYCIN >=8 RESISTANT Resistant     RIFAMPIN <=0.5 SENSITIVE  Sensitive     Inducible Clindamycin NEGATIVE Sensitive     * STAPHYLOCOCCUS EPIDERMIDIS  Culture, blood (Routine X 2) w Reflex to ID Panel     Status: None   Collection Time: 12/20/16 12:15 AM  Result Value Ref Range  Status   Specimen Description BLOOD RIGHT HAND  Final   Special Requests IN PEDIATRIC BOTTLE Blood Culture adequate volume  Final   Culture NO GROWTH 5 DAYS  Final   Report Status 12/25/2016 FINAL  Final  Culture, blood (Routine X 2) w Reflex to ID Panel     Status: None (Preliminary result)   Collection Time: 12/22/16  4:21 PM  Result Value Ref Range Status   Specimen Description BLOOD RIGHT HAND  Final   Special Requests IN PEDIATRIC BOTTLE Blood Culture adequate volume  Final   Culture NO GROWTH 3 DAYS  Final   Report Status PENDING  Incomplete     Labs: BNP (last 3 results) No results for input(s): BNP in the last 8760 hours. Basic Metabolic Panel:  Recent Labs Lab 12/19/16 0449 12/20/16 1115 12/24/16 0427 12/25/16 0448  NA 133*  --  132* 130*  K 3.4* 4.0 4.0 4.4  CL 105  --  99* 100*  CO2 24  --  25 19*  GLUCOSE 62*  --  109* 99  BUN 17  --  8 11  CREATININE 0.75  --  0.67 0.64  CALCIUM 8.4*  --  8.6* 8.4*  MG 1.9 1.8  --   --   PHOS 3.1 3.4  --   --    Liver Function Tests:  Recent Labs Lab 12/19/16 0449 12/24/16 0427  AST 18 21  ALT 14 15  ALKPHOS 84 89  BILITOT 0.7 0.6  PROT 6.4* 6.5  ALBUMIN 2.5* 2.6*   No results for input(s): LIPASE, AMYLASE in the last 168 hours. No results for input(s): AMMONIA in the last 168 hours. CBC:  Recent Labs Lab 12/19/16 0449 12/24/16 0427 12/25/16 0448  WBC 7.7 6.4 6.6  NEUTROABS 4.0  --   --   HGB 8.7* 9.4* 10.4*  HCT 26.6* 29.4* 30.9*  MCV 87.2 88.3 87.5  PLT 286 422* 337   Cardiac Enzymes: No results for input(s): CKTOTAL, CKMB, CKMBINDEX, TROPONINI in the last 168 hours. BNP: Invalid input(s): POCBNP CBG:  Recent Labs Lab 12/24/16 1638 12/24/16 2141 12/25/16 0735 12/25/16 1202 12/25/16 1709  GLUCAP 138* 108* 111* 171* 110*   D-Dimer No results for input(s): DDIMER in the last 72 hours. Hgb A1c  Recent Labs  12/23/16 1148  HGBA1C 7.1*   Lipid Profile No results for input(s): CHOL,  HDL, LDLCALC, TRIG, CHOLHDL, LDLDIRECT in the last 72 hours. Thyroid function studies No results for input(s): TSH, T4TOTAL, T3FREE, THYROIDAB in the last 72 hours.  Invalid input(s): FREET3 Anemia work up No results for input(s): VITAMINB12, FOLATE, FERRITIN, TIBC, IRON, RETICCTPCT in the last 72 hours. Urinalysis    Component Value Date/Time   COLORURINE YELLOW 12/17/2016 2345   APPEARANCEUR HAZY (A) 12/17/2016 2345   LABSPEC 1.019 12/17/2016 2345   PHURINE 6.0 12/17/2016 2345   GLUCOSEU NEGATIVE 12/17/2016 2345   HGBUR NEGATIVE 12/17/2016 2345   BILIRUBINUR NEGATIVE 12/17/2016 2345   KETONESUR NEGATIVE 12/17/2016 2345   PROTEINUR 30 (A) 12/17/2016 2345   UROBILINOGEN 1.0 07/31/2013 0155   NITRITE POSITIVE (A) 12/17/2016 2345   LEUKOCYTESUR TRACE (A) 12/17/2016 2345   Sepsis Labs Invalid input(s): PROCALCITONIN,  WBC,  LACTICIDVEN Microbiology Recent Results (  from the past 240 hour(s))  Urine culture     Status: Abnormal   Collection Time: 12/17/16 11:45 PM  Result Value Ref Range Status   Specimen Description URINE, CLEAN CATCH  Final   Special Requests Normal  Final   Culture (A)  Final    >=100,000 COLONIES/mL STAPHYLOCOCCUS SPECIES (COAGULASE NEGATIVE)   Report Status 12/20/2016 FINAL  Final   Organism ID, Bacteria STAPHYLOCOCCUS SPECIES (COAGULASE NEGATIVE) (A)  Final      Susceptibility   Staphylococcus species (coagulase negative) - MIC*    CIPROFLOXACIN 4 RESISTANT Resistant     GENTAMICIN <=0.5 SENSITIVE Sensitive     NITROFURANTOIN <=16 SENSITIVE Sensitive     OXACILLIN >=4 RESISTANT Resistant     TETRACYCLINE >=16 RESISTANT Resistant     VANCOMYCIN 2 SENSITIVE Sensitive     TRIMETH/SULFA <=10 SENSITIVE Sensitive     CLINDAMYCIN >=8 RESISTANT Resistant     RIFAMPIN <=0.5 SENSITIVE Sensitive     Inducible Clindamycin NEGATIVE Sensitive     * >=100,000 COLONIES/mL STAPHYLOCOCCUS SPECIES (COAGULASE NEGATIVE)  Culture, blood (Routine x 2)     Status: Abnormal    Collection Time: 12/18/16 12:00 AM  Result Value Ref Range Status   Specimen Description BLOOD RIGHT HAND RIGHT THUMB  Final   Special Requests   Final    BOTTLES DRAWN AEROBIC AND ANAEROBIC Blood Culture adequate volume   Culture  Setup Time   Final    GRAM POSITIVE COCCI IN CLUSTERS IN BOTH AEROBIC AND ANAEROBIC BOTTLES T.DANG PHARMD 12/19/16 0052 L.CHAMPION    Culture (A)  Final    STAPHYLOCOCCUS EPIDERMIDIS SUSCEPTIBILITIES PERFORMED ON PREVIOUS CULTURE WITHIN THE LAST 5 DAYS.    Report Status 12/22/2016 FINAL  Final  Blood Culture ID Panel (Reflexed)     Status: Abnormal   Collection Time: 12/18/16 12:00 AM  Result Value Ref Range Status   Enterococcus species NOT DETECTED NOT DETECTED Final   Vancomycin resistance NOT DETECTED NOT DETECTED Final   Listeria monocytogenes NOT DETECTED NOT DETECTED Final   Staphylococcus species DETECTED (A) NOT DETECTED Final    Comment: CRITICAL RESULT CALLED TO, READ BACK BY AND VERIFIED WITH: T.DANG PHARMD 12/19/16 0052 L.CHAMPION    Staphylococcus aureus NOT DETECTED NOT DETECTED Final   Methicillin resistance DETECTED (A) NOT DETECTED Final    Comment: CRITICAL RESULT CALLED TO, READ BACK BY AND VERIFIED WITH: T.DANG PHARMD 12/19/16 0052 L.CHAMPION    Streptococcus species NOT DETECTED NOT DETECTED Final   Streptococcus agalactiae NOT DETECTED NOT DETECTED Final   Streptococcus pneumoniae NOT DETECTED NOT DETECTED Final   Streptococcus pyogenes NOT DETECTED NOT DETECTED Final   Acinetobacter baumannii NOT DETECTED NOT DETECTED Final   Enterobacteriaceae species NOT DETECTED NOT DETECTED Final   Enterobacter cloacae complex NOT DETECTED NOT DETECTED Final   Escherichia coli NOT DETECTED NOT DETECTED Final   Klebsiella oxytoca NOT DETECTED NOT DETECTED Final   Klebsiella pneumoniae NOT DETECTED NOT DETECTED Final   Proteus species NOT DETECTED NOT DETECTED Final   Serratia marcescens NOT DETECTED NOT DETECTED Final   Carbapenem  resistance NOT DETECTED NOT DETECTED Final   Haemophilus influenzae NOT DETECTED NOT DETECTED Final   Neisseria meningitidis NOT DETECTED NOT DETECTED Final   Pseudomonas aeruginosa NOT DETECTED NOT DETECTED Final   Candida albicans NOT DETECTED NOT DETECTED Final   Candida glabrata NOT DETECTED NOT DETECTED Final   Candida krusei NOT DETECTED NOT DETECTED Final   Candida parapsilosis NOT DETECTED NOT DETECTED Final  Candida tropicalis NOT DETECTED NOT DETECTED Final  Culture, blood (Routine x 2)     Status: None   Collection Time: 12/18/16 12:25 AM  Result Value Ref Range Status   Specimen Description BLOOD RIGHT HAND  Final   Special Requests IN PEDIATRIC BOTTLE Blood Culture adequate volume  Final   Culture NO GROWTH 5 DAYS  Final   Report Status 12/23/2016 FINAL  Final  Culture, blood (Routine X 2) w Reflex to ID Panel     Status: Abnormal   Collection Time: 12/20/16 12:15 AM  Result Value Ref Range Status   Specimen Description BLOOD RIGHT ARM  Final   Special Requests IN PEDIATRIC BOTTLE Blood Culture adequate volume  Final   Culture  Setup Time   Final    GRAM POSITIVE COCCI IN CLUSTERS IN PEDIATRIC BOTTLE CRITICAL RESULT CALLED TO, READ BACK BY AND VERIFIED WITH: L POWELL,PHARMD AT 2044 12/20/16 BY L BENFIELD    Culture STAPHYLOCOCCUS EPIDERMIDIS (A)  Final   Report Status 12/22/2016 FINAL  Final   Organism ID, Bacteria STAPHYLOCOCCUS EPIDERMIDIS  Final      Susceptibility   Staphylococcus epidermidis - MIC*    CIPROFLOXACIN 4 RESISTANT Resistant     ERYTHROMYCIN >=8 RESISTANT Resistant     GENTAMICIN <=0.5 SENSITIVE Sensitive     OXACILLIN >=4 RESISTANT Resistant     TETRACYCLINE >=16 RESISTANT Resistant     VANCOMYCIN 2 SENSITIVE Sensitive     TRIMETH/SULFA <=10 SENSITIVE Sensitive     CLINDAMYCIN >=8 RESISTANT Resistant     RIFAMPIN <=0.5 SENSITIVE Sensitive     Inducible Clindamycin NEGATIVE Sensitive     * STAPHYLOCOCCUS EPIDERMIDIS  Culture, blood (Routine X 2)  w Reflex to ID Panel     Status: None   Collection Time: 12/20/16 12:15 AM  Result Value Ref Range Status   Specimen Description BLOOD RIGHT HAND  Final   Special Requests IN PEDIATRIC BOTTLE Blood Culture adequate volume  Final   Culture NO GROWTH 5 DAYS  Final   Report Status 12/25/2016 FINAL  Final  Culture, blood (Routine X 2) w Reflex to ID Panel     Status: None (Preliminary result)   Collection Time: 12/22/16  4:21 PM  Result Value Ref Range Status   Specimen Description BLOOD RIGHT HAND  Final   Special Requests IN PEDIATRIC BOTTLE Blood Culture adequate volume  Final   Culture NO GROWTH 3 DAYS  Final   Report Status PENDING  Incomplete    Time coordinating discharge:  35 minutes  SIGNED:  Chipper Oman, MD  [facility] 12/25/2016, 5:19 PM  Pager please text page via  [email or web address] Password [email or web address]

## 2016-12-25 NOTE — Progress Notes (Signed)
PHARMACY CONSULT NOTE FOR:  OUTPATIENT  PARENTERAL ANTIBIOTIC THERAPY (OPAT)  Indication: bacteremia Regimen: Vancomycin 750 mg iv q 12 hours End date: January 04, 2017  IV antibiotic discharge orders are pended. To discharging provider:  please sign these orders via discharge navigator,  Select New Orders & click on the button choice - Manage This Unsigned Work.     Thank you for allowing pharmacy to be a part of this patient's care.  Tad Moore 12/25/2016, 2:58 PM

## 2016-12-25 NOTE — Care Management Important Message (Signed)
Important Message  Patient Details  Name: Rhonda Spencer MRN: [id number] Date of Birth: 1940-04-29   Medicare Important Message Given:  Yes    Nathen May 12/25/2016, 10:27 AM

## 2016-12-25 NOTE — Progress Notes (Signed)
Patient has an order for PICC line to be placed. Patient and family very upset that the doctor told her this AM that after PICC line will be placed she will be able to go home with home health. IV team paged but PICC nurse is not available at this time. Case manager involved. Will continue to monitor.

## 2016-12-25 NOTE — Progress Notes (Signed)
Peripherally Inserted Central Catheter/Midline Placement  The IV Nurse has discussed with the patient and/or persons authorized to consent for the patient, the purpose of this procedure and the potential benefits and risks involved with this procedure.  The benefits include less needle sticks, lab draws from the catheter, and the patient may be discharged home with the catheter. Risks include, but not limited to, infection, bleeding, blood clot (thrombus formation), and puncture of an artery; nerve damage and irregular heartbeat and possibility to perform a PICC exchange if needed/ordered by physician.  Alternatives to this procedure were also discussed.  Bard Power PICC patient education guide, fact sheet on infection prevention and patient information card has been provided to patient /or left at bedside.    PICC/Midline Placement Documentation        Synthia Innocent 12/25/2016, 4:11 PM

## 2016-12-25 NOTE — Progress Notes (Signed)
Patient was discharged home with home health by MD order; discharged instructions  review and give to patient with care notes; IV DIC; PICC line in place flushed and capped; patient will be escorted to the car by nurse tech via wheelchair.

## 2016-12-26 ENCOUNTER — Other Ambulatory Visit: Payer: Self-pay

## 2016-12-26 DIAGNOSIS — Z931 Gastrostomy status: Secondary | ICD-10-CM | POA: Diagnosis not present

## 2016-12-26 DIAGNOSIS — M069 Rheumatoid arthritis, unspecified: Secondary | ICD-10-CM | POA: Diagnosis not present

## 2016-12-26 DIAGNOSIS — D649 Anemia, unspecified: Secondary | ICD-10-CM | POA: Diagnosis not present

## 2016-12-26 DIAGNOSIS — N390 Urinary tract infection, site not specified: Secondary | ICD-10-CM | POA: Diagnosis not present

## 2016-12-26 DIAGNOSIS — Z5181 Encounter for therapeutic drug level monitoring: Secondary | ICD-10-CM | POA: Diagnosis not present

## 2016-12-26 DIAGNOSIS — Z452 Encounter for adjustment and management of vascular access device: Secondary | ICD-10-CM | POA: Diagnosis not present

## 2016-12-26 DIAGNOSIS — I099 Rheumatic heart disease, unspecified: Secondary | ICD-10-CM | POA: Diagnosis not present

## 2016-12-26 DIAGNOSIS — I5030 Unspecified diastolic (congestive) heart failure: Secondary | ICD-10-CM | POA: Diagnosis not present

## 2016-12-26 DIAGNOSIS — Z794 Long term (current) use of insulin: Secondary | ICD-10-CM | POA: Diagnosis not present

## 2016-12-26 DIAGNOSIS — E43 Unspecified severe protein-calorie malnutrition: Secondary | ICD-10-CM | POA: Diagnosis not present

## 2016-12-26 DIAGNOSIS — I330 Acute and subacute infective endocarditis: Secondary | ICD-10-CM | POA: Diagnosis not present

## 2016-12-26 DIAGNOSIS — C259 Malignant neoplasm of pancreas, unspecified: Secondary | ICD-10-CM | POA: Diagnosis not present

## 2016-12-26 DIAGNOSIS — E119 Type 2 diabetes mellitus without complications: Secondary | ICD-10-CM | POA: Diagnosis not present

## 2016-12-26 DIAGNOSIS — I110 Hypertensive heart disease with heart failure: Secondary | ICD-10-CM | POA: Diagnosis not present

## 2016-12-26 DIAGNOSIS — L89321 Pressure ulcer of left buttock, stage 1: Secondary | ICD-10-CM | POA: Diagnosis not present

## 2016-12-26 DIAGNOSIS — T827XXA Infection and inflammatory reaction due to other cardiac and vascular devices, implants and grafts, initial encounter: Secondary | ICD-10-CM | POA: Diagnosis not present

## 2016-12-26 DIAGNOSIS — J189 Pneumonia, unspecified organism: Secondary | ICD-10-CM | POA: Diagnosis not present

## 2016-12-26 NOTE — Patient Outreach (Unsigned)
[facility]) Care Management  12/26/16  ANAIZ QAZI April 01, 1941 [id number]  New referral from hospital liaison on 12/19/2016 for transition of care follow up while she was still inpatient. Received inbasket today from Natividad Brood, RN stating that patient was discharged from the hospital on 12/25/2016 and provided updated information that patient's daughter has own serious health issues.   Attempted to reach patient without success. Left HIPPA compliant voicemail with RNCM contact information and invited patient to call back.  Plan: Will continue to attempt to reach patient for transition of care.  [place] R. Tyresha Fede, RN, BSN, [place] Management Coordinator [phone]

## 2016-12-27 LAB — CULTURE, BLOOD (ROUTINE X 2)
Culture: NO GROWTH
Special Requests: ADEQUATE

## 2016-12-29 ENCOUNTER — Other Ambulatory Visit: Payer: Self-pay

## 2016-12-29 DIAGNOSIS — A419 Sepsis, unspecified organism: Secondary | ICD-10-CM | POA: Diagnosis not present

## 2016-12-29 DIAGNOSIS — C259 Malignant neoplasm of pancreas, unspecified: Secondary | ICD-10-CM | POA: Diagnosis not present

## 2016-12-29 DIAGNOSIS — E119 Type 2 diabetes mellitus without complications: Secondary | ICD-10-CM | POA: Diagnosis not present

## 2016-12-29 DIAGNOSIS — M5136 Other intervertebral disc degeneration, lumbar region: Secondary | ICD-10-CM | POA: Diagnosis not present

## 2016-12-29 DIAGNOSIS — J189 Pneumonia, unspecified organism: Secondary | ICD-10-CM | POA: Diagnosis not present

## 2016-12-29 DIAGNOSIS — T827XXA Infection and inflammatory reaction due to other cardiac and vascular devices, implants and grafts, initial encounter: Secondary | ICD-10-CM | POA: Diagnosis not present

## 2016-12-29 DIAGNOSIS — N390 Urinary tract infection, site not specified: Secondary | ICD-10-CM | POA: Diagnosis not present

## 2016-12-29 DIAGNOSIS — I330 Acute and subacute infective endocarditis: Secondary | ICD-10-CM | POA: Diagnosis not present

## 2016-12-30 ENCOUNTER — Other Ambulatory Visit: Payer: Self-pay

## 2016-12-31 ENCOUNTER — Ambulatory Visit: Payer: Self-pay

## 2016-12-31 ENCOUNTER — Other Ambulatory Visit: Payer: Self-pay

## 2016-12-31 DIAGNOSIS — J189 Pneumonia, unspecified organism: Secondary | ICD-10-CM | POA: Diagnosis not present

## 2016-12-31 DIAGNOSIS — E119 Type 2 diabetes mellitus without complications: Secondary | ICD-10-CM | POA: Diagnosis not present

## 2016-12-31 DIAGNOSIS — N390 Urinary tract infection, site not specified: Secondary | ICD-10-CM | POA: Diagnosis not present

## 2016-12-31 DIAGNOSIS — I330 Acute and subacute infective endocarditis: Secondary | ICD-10-CM | POA: Diagnosis not present

## 2016-12-31 DIAGNOSIS — T827XXA Infection and inflammatory reaction due to other cardiac and vascular devices, implants and grafts, initial encounter: Secondary | ICD-10-CM | POA: Diagnosis not present

## 2016-12-31 DIAGNOSIS — A411 Sepsis due to other specified staphylococcus: Secondary | ICD-10-CM | POA: Diagnosis not present

## 2016-12-31 DIAGNOSIS — C259 Malignant neoplasm of pancreas, unspecified: Secondary | ICD-10-CM | POA: Diagnosis not present

## 2016-12-31 NOTE — Patient Outreach (Signed)
[facility]) Care Management  Late entry for 12/29/2016  ROSILYN COACHMAN  [birth year] [id number]   RNCM received referral from hospital liaison on 12/19/2016 for transition of care follow up. Patient was actually discharged on 12/25/2016. Following PCP labs showing elevated liver enzymes in June 2018, patient saw Dr. Carol Ada, GI for upper endoscopic ultrasound and CT of abdomen and pelvis on 6/22, diagnosed with pancreatic cancer; had repeat upper endoscopic ultrasound 6/29 showing 2.4 cm mass at pancreatic head and bile duct dilation, converted to ERCP with biliary sphincterotomy performed and stent placed in commond bile duct. Pathology showed malignant cells present consistent with adenocarcinoma. Patient saw Dr. Truitt Merle who recommended only curable tx for early stage pancreatic cancer is surgical resection and will need whipple due to location, d/t age and low performance, not a candidate for intensive chemo; recommends adjuvant gemcitabine if whipple offered by Dr. Barry Dienes.  Saw Dr. Barry Dienes 7/24 who scheduled OP PT for strengthening and surgery on 11/13/2016. She was admitted 8/9 for pancreaticduodenectomy and jejunostomy tube place. During hospitalization, did not tolerate advancement of tube feeds well, with nausea, vomiting and periumbilical pain. Did ok with small amounts of oral feeds, but only 20% of caloric needs met PO. PlCC line placed and planned to dc home on TPN, but family uncomfortable with PICC management. She was dc'd to [facility] on 12/04/2016. On 12/17/2016 she presented from SNF to ED with complaints of fever x 2 days up to 102F in SNF, spasm like pains in mid back between shoulder blades, shortness of breath, nausea/vomiting, adbominal soreness and weight loss of 10 lbs since surgery. In ED, was diagnosed with HCAP and UTI, blood cultures showed staph epi and PICC was removed. Repeat cultures positive for staph epi. 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 ID advised to treat with IV Vancomycin x 2 weeks. PICC replaced. Also, she had been tolerating soft diet and consult with Dr. Barry Dienes who dc'd TPN after being on this for almost a month. She was discharged to home on 12/25/2016 with [facility] was 9/20 10 pm. Daughter was taught how to use PICC line and administer IV antibiotics.   Patient has a past medical history of adenocarcinoma of pancreas s/p pancreaticoduodenectomy, diastolic HF, DM type II (W4O 7.1), aortic insufficiency, anemia, HTN, mitral regurgitation, rheumatic fever, rheumatic heart disease, rheumatoid arthritis.   Successful outreach completed with patient for transition of care. Patient identification verified. Patient's daughter, Ivin Booty was also on the phone with patient and RNCM for part of the call.  Patient stated she has been "ok" since discharge home. Stated that she does have some aching in her back related to chronic back pain. She stated that she has rheumatoid arthritis and stenosis of the spine. She stated that she takes Tylenol for the pain and it does help. Stated that she has also tried a heating pad and lidocaine patches.   Patient stated that she has all of her medicines and that [facility] came out to start her IV antibiotics last week. She stated that she is getting antibiotics in her PICC line twice a day and Ivin Booty stated that she is doing the doses when home health is not. She stated she has not had any problems doing this and does not have any questions or concerns. Patient stated she will be finished with her IV antibiotics next Friday 10/5. Patient did state that a nurse from  [facility] was supposed to come out today between 8 am and 10 am and no one has shown up or called. Her daughter stated that they had been told to hold her medicines and not eat because they were drawing some labs. RNCM offered to contact [place] at conclusion  of this call to see if nurse is on schedule to come out as patient is waiting and needs to get her IV antibiotics started.  Patient stated she is not taking any insulin at home. She stated that she is not a diabetic and was not on any insulin prior to her surgery. She stated that her PCP did not recommend that she take it at home. RNCM to follow up with PCP regarding this as patient does have insulin noted on her discharge instructions. Patient stated she was not aware this was something she was supposed to take at home. She stated she did not receive any prescriptions for it and nothing was called in to her pharmacy. She also does not have a glucometer and has never had to check her blood sugar. She stated that they were doing this in the hospital and at [facility] and she would get insulin based on how much her blood sugar was.   PCP did not recommend insulin at home  Patient was able to verbalize understanding of her hospitalization. She stated that in August, she had surgery for her pancreas and needed a PICC line. She stated that she was on TPN at the time and was going to come home, but she felt it was too much for her daughter to have to take care of as she has her own medical issues, so she went to rehab at [facility]. She said she then got an infection and got real sick and had to go back to the hospital. She said she has an infection in her heart and the PICC line was removed and reinserted last Friday night.  She currently denies any chest pain, shortness of breath and no fevers.  RNCM advised will call and follow up with home health and PCP and will return call to patient. Home visit also scheduled for this week and patient is agreeable.

## 2016-12-31 NOTE — Patient Outreach (Incomplete)
[facility]) Care Management  Late entry for 12/30/2016  MARCEY PERSAD Jul 16, 1940 [id number]  Acute home visit completed with patient and her daughter, Ivin Booty.  Subjective: "I am worried about this tube not being flushed." Patient and her daughter voiced concerns that tube has not been flushed in several days and now has brownish liquid backing up into it.   Objective: Blood pressure 140/76, pulse 77, resp. rate 18, height 1.651 m (5\' 5" ), weight 132 lb (59.9 kg), SpO2 97 %. Respirations even and unlabored. Lung sound clear with auscultation.  Regular heart rate and rhythm. PICC line in right arm.  Jejunostomy tube with dressing intact. Brown drainage backing into tubing.   Assessment: Patient had previously called RNCM this morning with concerns about her feeding tube. She reported that she has not yet heard back from Dr. Marlowe Aschoff office and they were concerned because it now has brownish fluid backing up into tubing and she has seen this happen since it was placed. Patient stated that she has not had the tube flushed since she was in the hospital and does not have supplies to flush it with at home. She and her daughter both stated that no one had told them that they needed to flush it and they have not been shown how to do so.   Upon assessment, patient does have dressing intact covering jejunostomy tube insertion site with tubing taped to abdomen. No leakage noted. The dressing  was dated 12/14/2016. RNCM removed dressing and there was a small amount of brownish drainage noted on the gauze. No bleeding noted. No redness noted at sutures, but patient does report some tenderness at top suture as it looks as if it was being pulled a little by the dressing. RNCM provided education about keeping it clean and dry and placed a new dressing over tube insertion site. Patient stated that she had mentioned to her home health nurse yesterday and that she looked at it while she was there, but  did not specifically address it before leaving. She stated she was not aware if they had planned to flush it for her or not. Patient reported that she had a low-grade temperature of 99.1 yesterday when the home health nurse was out but has not checked it since. Was unable to locate thermometer during visit, but patient did not appear to have a fever. Patient denies any vomiting or diarrhea. Also denies chills. Patient does complain of tenderness in abdomen, currently rates 5/10, stating that it is aching, sore, and uncomfortable. She also does continue to complain of ongoing pain in back, rating it 7/10 and stated it is a stabbing type pain. She stated that she only takes Tylenol for her back pain and it usually helps some.   Patient stated that she has not had an appetite since her surgery. She reported that she has had over 20 lbs weight loss since her surgery in August. Stated that she was on TPN at first, but is no longer on TPN. Patient is not receiving any tube feedings at present as she had a difficult time tolerating them. She stated that she has never really eaten very much. She stated now, she is trying to make sure she drinks a full Ensure per day. Her daughter also prepares her meals, but reported that she eats less than 1/3 if what is on the plate. Ivin Booty stated that she usually only eats a couple of spoonfuls of food. She stated that the day before yesterday, she  did make her a big breakfast and she ate pretty good, but in general does not eat very much. They are not sure if she has any any additional weight loss. Stated that she has a set of scales, but she is not sure where it is. RNCM provided education and support around nutrition and keeping up with her weights. She said her last weight she is aware of was 132 at the hospital.   Patient stated that no one from her doctor's office has called her about her insulin yet. Patient also reported that she has not had her Creon since she was discharged  and is concerned because she has not been taking it. RNCM advised will follow up with Dr. Marlowe Aschoff office since PCP has not been involved since her surgery.   RNCM followed up with [place] and PCP office regarding feeding tube as noted.  RNCM provided 60 ml syringe and instructions on how to flush her j-tube twice a day with 50 mls of water per Dr. Marlowe Aschoff office. RNCM demonstrated how to flush the tube for patient and her daughter. No resistance noted with flushing. Patient tolerated well. Patient's daughter Ivin Booty was able to verbalize how to to flush the tube, as well as the instructions.      Talked to [facility] Glad at [facility] Not sure why the syringe was not delivered, can we move up till [place]

## 2016-12-31 NOTE — Patient Outreach (Signed)
[facility]) Care Management  Late entry for 12/30/2016  Rhonda Spencer 1940-07-18 [id number]  11:41 am RNCM received voicemail from patient stating she had forgotten about the status of her medications and would like a callback.  11:45 am Successful outreach completed with patient. Patient stated that she is having difficulty with her feeding tube. She reported that it is leaking and her daughter is currently on the phone with her doctor about it, waiting to hear what to do. Patient expressed concern because it looks different. After talking with patient, determined that the tube is not leaking out, but has brownish fluid backing up into the tube, which she has not seen before. She stated that when she was in the hospital, it was being flushed, but it has not been flushed since she has been home. Patient stated that she has never been instructed on how to flush it or that she should be doing that at home. RNCM advised currently going into a home visit, but can add her to schedule to be seen today and she was grateful and requested that nurse come out to look at her feeding tube.  RNCM to see patient this afternoon.  [place] R. Zakhai Meisinger, RN, BSN, [place] Management Coordinator [phone]

## 2016-12-31 NOTE — Patient Outreach (Signed)
[place] [facility]) Care Management  Late entry for 12/29/2016  Rhonda Spencer [birth year] [id number]  10:50 am Successful outreach completed with [place] at [phone] and spoke with  Rhonda Spencer who stated that there was a scheduling error and she does not have a nurse that can go out today. She stated that someone will be out tomorrow to see patient. She stated that patient's daughter can go ahead and give her IV antibiotics today and she is going to call her and let her know right now.  11:03 am RNCM received incoming call from Rhonda Spencer with [place] stating she does have someone who can get out to see patient today. Stated that a nurse should be out there shortly and will draw labs and give IV antibiotics.  [place] R. Morey Andonian, RN, BSN, [place] Management Coordinator [phone]

## 2016-12-31 NOTE — Patient Outreach (Signed)
[facility] Care Management  Late entry for 12/29/2016  Rhonda Spencer  May 10, 1940 [id number]  10:55 am Successful outreach completed with Dr. Berneta [place]'s office at [phone] and spoke with Carlyle Basques. Advised of patient's recent hospital admission and that she was discharged home. Calling to clarify if she needs to be on insulin at home as this is in her discharge paperwork, but she did not receive any prescriptions and stated that Dr. Berdine Addison did not recommend that she take insulin because she is not diabetic. Per Carlyle Basques, patient has not been seen in the office since her surgery and Dr. Berdine Addison has not advised her not to take insulin. However, she will need to follow up with him regarding if patient needs to be on this or not. Advised patient would also need an order for a glucometer as she has no way to check her blood sugar. She stated that she would get message to Dr. Berdine Addison and someone from their office would call patient today by end of the business day.  11:06 am Successful outreach completed with patient. Patient identification verified.  RNCM contacted patient to let her know what Dr. Cathey Endow office said and advised she should be hearing from them by the end of the day. Provided RNCM contact information and encouraged to call with any questions or concerns.  [place] R. Ashyra Cantin, RN, BSN, [place] Management Coordinator [phone]

## 2016-12-31 NOTE — Patient Outreach (Signed)
[facility]) Care Management  12/31/16  Rhonda Spencer 15-Nov-1940 [id number]  RNCM performed acute home visit yesterday with patient and her daughter. Today's routine home visit was canceled and patient is aware.   During home visit, patient indicated that PCP office had not followed up with her regarding her Creon or whether or not she should be on insulin. Since patient has not seen PCP since diagnosed with pancreatic cancer and has been following up with surgeon, will contact their office regarding these medications.  8:57 am  Successful outreach completed with Dr. Marlowe Aschoff office. Spoke with Rhonda Spencer, nurse in triage. Advised of concerns that patient had been on insulin since her admission for her surgery in August, but is not taking any insulin at home. Advised that patient was under the impression that her PCP did not recommend she take it, but patient has not had any follow up with her PCP since her surgery. Advised that she does not have a glucometer at home and has not had any way to check to see what her blood sugar has been. Also advised patient has not been taking Creon since her discharge home either. [facility] did go out on Monday to draw labs, but RNCM unaware of results - should have a glucose reading on there. Per Rhonda Spencer, she will try to get a copy of the labs and discuss with Dr. Barry Dienes who is in the office today. RNCM contact information provided and Rhonda Spencer to follow up with RNCM once she discusses with Dr. Norman Clay.   10:50 am RNCM received callback from [facility] with Dr. Marlowe Aschoff office. She stated that they were able to review patient's labs from Monday and her blood sugar was 81. At this time, they do not feel that continuing insulin is necessary. She also did not feel it is necessary for patient to have a glucometer and to check her sugar. She also stated that the labs did not show indications that patient needs to be on her Creon as well. She stated that  unless patient is eating more and/or is experiencing diarrhea,  she will be ok not to resume this and Dr. Barry Dienes will discuss all of this with her at her follow up appointment next week on 01/06/2017.  10:54 am Successful outreach completed with patient's daughter, Rhonda Spencer. Patient identification verified. She stated that patient's nurse from [place] was there with patient. RNCM provided updates from Dr. Marlowe Aschoff office about not continuing insulin or Creon at this time. Ivin Booty verbalized understanding and will let patient know as well. Encouraged to call with any additional needs or concerns.  RNCM to continue to follow up with patient for continued transition of care and support.   [place] R. Kazimierz Springborn, RN, BSN, [place] Management Coordinator [phone]

## 2017-01-02 ENCOUNTER — Other Ambulatory Visit: Payer: Self-pay | Admitting: Pharmacist

## 2017-01-02 ENCOUNTER — Other Ambulatory Visit: Payer: Self-pay

## 2017-01-05 DIAGNOSIS — I330 Acute and subacute infective endocarditis: Secondary | ICD-10-CM | POA: Diagnosis not present

## 2017-01-05 DIAGNOSIS — E119 Type 2 diabetes mellitus without complications: Secondary | ICD-10-CM | POA: Diagnosis not present

## 2017-01-05 DIAGNOSIS — C259 Malignant neoplasm of pancreas, unspecified: Secondary | ICD-10-CM | POA: Diagnosis not present

## 2017-01-05 DIAGNOSIS — J189 Pneumonia, unspecified organism: Secondary | ICD-10-CM | POA: Diagnosis not present

## 2017-01-05 DIAGNOSIS — B9562 Methicillin resistant Staphylococcus aureus infection as the cause of diseases classified elsewhere: Secondary | ICD-10-CM | POA: Diagnosis not present

## 2017-01-05 DIAGNOSIS — N390 Urinary tract infection, site not specified: Secondary | ICD-10-CM | POA: Diagnosis not present

## 2017-01-05 DIAGNOSIS — T827XXA Infection and inflammatory reaction due to other cardiac and vascular devices, implants and grafts, initial encounter: Secondary | ICD-10-CM | POA: Diagnosis not present

## 2017-01-07 DIAGNOSIS — C259 Malignant neoplasm of pancreas, unspecified: Secondary | ICD-10-CM | POA: Diagnosis not present

## 2017-01-07 DIAGNOSIS — C250 Malignant neoplasm of head of pancreas: Secondary | ICD-10-CM | POA: Diagnosis not present

## 2017-01-07 DIAGNOSIS — I051 Rheumatic mitral insufficiency: Secondary | ICD-10-CM | POA: Diagnosis not present

## 2017-01-07 DIAGNOSIS — I10 Essential (primary) hypertension: Secondary | ICD-10-CM | POA: Diagnosis not present

## 2017-01-08 DIAGNOSIS — C259 Malignant neoplasm of pancreas, unspecified: Secondary | ICD-10-CM | POA: Diagnosis not present

## 2017-01-08 DIAGNOSIS — I330 Acute and subacute infective endocarditis: Secondary | ICD-10-CM | POA: Diagnosis not present

## 2017-01-08 DIAGNOSIS — J189 Pneumonia, unspecified organism: Secondary | ICD-10-CM | POA: Diagnosis not present

## 2017-01-08 DIAGNOSIS — N390 Urinary tract infection, site not specified: Secondary | ICD-10-CM | POA: Diagnosis not present

## 2017-01-08 DIAGNOSIS — E119 Type 2 diabetes mellitus without complications: Secondary | ICD-10-CM | POA: Diagnosis not present

## 2017-01-08 DIAGNOSIS — T827XXA Infection and inflammatory reaction due to other cardiac and vascular devices, implants and grafts, initial encounter: Secondary | ICD-10-CM | POA: Diagnosis not present

## 2017-01-09 ENCOUNTER — Telehealth: Payer: Self-pay | Admitting: Internal Medicine

## 2017-01-09 ENCOUNTER — Other Ambulatory Visit: Payer: Self-pay

## 2017-01-09 NOTE — Telephone Encounter (Signed)
I was called by [facility] and gave an order to have Rhonda Spencer PICC removed. She has completed her vancomycin.

## 2017-01-09 NOTE — Patient Outreach (Signed)
[facility]) Care Management  01/09/17  Rhonda Spencer Feb 14, 1941 [id number]  RNCM received voicemail from patient today requesting callback about her PICC line. She stated that she has finished her antibiotics and was wanting to know how to go about getting her PICC line removed.  RNCM returned call to patient. Patient identification verified.  Patient stated that she has been doing well. She stated that she did go for her follow up with Dr. Barry Dienes this week and had her feeding tube removed. She stated that her appetite has been getting a little better. She stated that she is still not taking Creon or insulin. RNCM educated patient to notify Dr. Barry Dienes if she has increased oral intake causing her difficulty or if she begins to experience diarrhea as this may mean she needs to restart the Creon. Patient verbalized understanding and currently denies any problems or diarrhea. She currently denies any pain, but stated that she is feeling a little weak from not being able to be up and moving for a month. She reported that she is just trying to get her strength.   Patient denies any signs or symptoms of infection. She reported that she has completed her antibiotics and her concern now is that she wants the PICC line removed. RNCM advised that her home health nurse with [facility] should be coming to remove the PICC line, but unable to state when. Advised that they did call Dr. Megan Salon for orders today. She stated that she would follow up with her home health nurse so she could get a better idea of when they are taking it out. She stated that they told her as soon as they got an order, they would come out, even if it was on a weekend. RNCM encouraged her to call her home heath nurse for further information about when she would be out.  Patient has no other questions or concerns at present. She stated that she is doing pretty good and has no complaints or needs.  Plan: RNCM to  follow up with patient next week for continued transition of care.  \ [place] R. Nilsa Macht, RN, BSN, [place] Management Coordinator [phone]

## 2017-01-11 NOTE — Progress Notes (Signed)
Cardiology Office Note    Date:  01/12/2017   ID:  EARLISHA SHARPLES, DOB 1940-09-10, MRN [id number]  PCP:  Iona Beard, MD  Cardiologist: Dr. Debara Pickett   Chief Complaint  Patient presents with  . Hospitalization Follow-up    History of Present Illness:    Rhonda Spencer is a 76 y.o. female with past medical history of rheumatic heart disease, moderate to severe MR (mild by most recent echo in 12/2016), moderate AI, adenocarcinoma of pancreas (s/p pancreaticoduodenectomy in 11/2016), Type 2 DM and sinus bradycardia who presents to the office today for 79-monthfollow-up.   Rhonda Spencer was last examined by HAlmyra Deforest PA-C in 06/2016 and reported doing well from a cardiac perspective at that time. Rhonda Spencer has chronic dyspnea on exertion but denied any acute worsening of her symptoms. Was admitted in 11/2016 for a planned Whipple Procedure in the setting of pancreatic cancer. Had bradycardia on the day of surgery but this improved with Atropine and HR remained stable following the one episode. Rhonda Spencer experienced pain with tube feeds but this improved throughout her hospitalization and Rhonda Spencer was discharged to SNF.   [facility] of fevers and dyspnea, found to have sepsis in the setting of HCAP and a UTI. Also found to have MRSE bacteremia thought to be due to the PICC for IV feeds and was therefore treated with Vancomycin. A TTE was obtained and showed a preserved EF of 60-65%, Grade 1 DD, possible aortic vegetation, mild to moderate AI, and mild MR. TEE was deferred and Rhonda Spencer was continued on Vancomycin for 2 weeks per Infectious Disease recommendations.   In talking with the patient today, Rhonda Spencer reports overall doing well since her recent hospitalization. Her PICC line was removed earlier today which Rhonda Spencer is very excited about. Rhonda Spencer has been gradually building up her energy and is overall pleased with her progress.   Rhonda Spencer denies any recent chest pain, dyspnea  on exertion, orthopnea, PND, lower extremity edema, lightheadedness, or presyncope. No recent fever or chills. Since her Whipple Procedure, Rhonda Spencer has been able to come off of her insulin along with pancreatic enzymes.    Past Medical History:  Diagnosis Date  . AI (aortic insufficiency)   . Anemia   . History of nuclear stress test 11/2008   bruce myoview; normal pattern of perfusion in allregions, no significant wall motion abnormalities; no ECG changes, no significant ischemia demonstrated, low risk scan   . Hypertension   . MR (mitral regurgitation)   . Rheumatic fever   . Rheumatic heart disease   . Rheumatoid arthritis ([place]   . Sinus bradycardia     Past Surgical History:  Procedure Laterality Date  . ABDOMINAL HYSTERECTOMY    . CHOLECYSTECTOMY    . COLON SURGERY    . ERCP N/A 10/03/2016   Procedure: ENDOSCOPIC RETROGRADE CHOLANGIOPANCREATOGRAPHY (ERCP);  Surgeon: HCarol Ada MD;  [facility];  Service: Endoscopy;  Laterality: N/A;  . EXCISIONAL HEMORRHOIDECTOMY    . HEMI-MICRODISCECTOMY LUMBAR LAMINECTOMY LEVEL 4  1995  . JEJUNOSTOMY N/A 11/13/2016   Procedure: PLACEMENT JEJUNOSTOMY TUBE;  Surgeon: BStark Klein MD;  [facility]  Service: General;  Laterality: N/A;  . LAPAROSCOPY N/A 11/13/2016   Procedure: ATTEMPTED DAGNOSTIC LAPAROSCOPY;  Surgeon: BStark Klein MD;  [facility] [facility]  Service: General;  Laterality: N/A;  . TONSILLECTOMY    . TRANSTHORACIC ECHOCARDIOGRAM  11/2011   EF=>55%; LA mild-mod dilated; mod-severe MR;  mild-mod TR with normal systolic pressure; mild-mod AV regurg; mild pulm valve regurg   . UPPER ESOPHAGEAL ENDOSCOPIC ULTRASOUND (EUS) N/A 09/26/2016   Procedure: UPPER ESOPHAGEAL ENDOSCOPIC ULTRASOUND (EUS);  Surgeon: Carol Ada, MD;  [facility];  Service: Endoscopy;  Laterality: N/A;  . UPPER ESOPHAGEAL ENDOSCOPIC ULTRASOUND (EUS) N/A 10/03/2016   Procedure: UPPER ESOPHAGEAL ENDOSCOPIC ULTRASOUND (EUS);  Surgeon: Carol Ada, MD;  [facility];  Service: Endoscopy;  Laterality: N/A;  . WHIPPLE PROCEDURE N/A 11/13/2016   Procedure: WHIPPLE PROCEDURE;  Surgeon: [name], MD;  Location: [place];  Service: General;  Laterality: N/A;    Current Medications: Outpatient Medications Prior to Visit  Medication Sig Dispense Refill  . acetaminophen (TYLENOL) 325 MG tablet Take 2 tablets (650 mg total) by mouth every 4 (four) hours as needed for mild pain, moderate pain, fever or headache. (Patient taking differently: Take 650 mg by mouth every 4 (four) hours as needed (for elevated temperature or pain). ) 90 tablet 2  . aspirin EC 81 MG tablet Take 81 mg by mouth daily.    . felodipine (PLENDIL) 5 MG 24 hr tablet Take 5 mg by mouth daily.    . betamethasone dipropionate (DIPROLENE) 0.05 % cream Apply 1 application topically daily as needed (for itching/TO AFFECTED AREAS).     . bisacodyl (DULCOLAX) 10 MG suppository Place 1 suppository (10 mg total) rectally daily as needed for moderate constipation. (Patient taking differently: Place 10 mg rectally daily as needed (FOR CONSTIPATION). ) 12 suppository 0  . insulin aspart (NOVOLOG) 100 UNIT/ML injection Inject 0-15 Units into the skin 3 (three) times daily with meals. (Patient taking differently: Inject 0-10 Units into the skin 3 (three) times daily with meals. PER SLIDING SCALE: BGL 0-150= 0 units; 151-200 = 2 units; 201-250 = 4 units; 251-300 = 6 units; 301-350 = 8 units; 351-400 = 10 units) 10 mL 11  . Insulin Detemir (LEVEMIR FLEXPEN) 100 UNIT/ML Pen Inject 10 Units into the skin 2 (two) times daily.    Rhonda Kitchen ketotifen (ZADITOR) 0.025 % ophthalmic solution Place 1 drop into the left eye every 8 (eight) hours as needed (FOR IRRITATION).     Rhonda Kitchen lidocaine (LIDODERM) 5 % Place 1 patch onto the skin daily as needed (FOR PAIN). Remove & Discard patch within 12 hours or as directed by MD     . lipase/protease/amylase (CREON) 36000 UNITS CPEP capsule Take 1 capsule  (36,000 Units total) by mouth 3 (three) times daily with meals. 90 capsule 3  . oxyCODONE (OXY IR/ROXICODONE) 5 MG immediate release tablet Take 5 mg by mouth every 4 (four) hours as needed ("FOR PAIN FOR 3 DAYS").    . pantoprazole (PROTONIX) 40 MG tablet Take 1 tablet (40 mg total) by mouth 2 (two) times daily. 60 tablet 3  . prochlorperazine (COMPAZINE) 10 MG tablet Take 1 tablet (10 mg total) by mouth every 6 (six) hours as needed for nausea or vomiting (Use for nausea and / or vomiting unresolved with ondansetron (Zofran).). (Patient taking differently: Take 10 mg by mouth every 6 (six) hours as needed for nausea or vomiting. ) 30 tablet 0  . vancomycin IVPB Inject 750 mg into the vein every 12 (twelve) hours. Indication:  bacteremia Last Day of Therapy:  01/04/17 Labs - Sunday/Monday:  CBC/D, BMP, and vancomycin trough. Labs - Thursday:  BMP and vancomycin trough Labs - Every other week:  ESR and CRP 28 Units 0   No facility-administered medications prior to visit.  Allergies:   Tarka [trandolapril-verapamil hcl er]; Atorvastatin; and Contrast media [iodinated diagnostic agents]   Social History   Social History  . Marital status: Single    Spouse name: N/A  . Number of children: 2  . Years of education: 14   Occupational History  . Retired    Social History Main Topics  . Smoking status: Former Smoker    Years: 8.00    Quit date: 04/08/1967  . Smokeless tobacco: Never Used  . Alcohol use No  . Drug use: No  . Sexual activity: Not Asked   Other Topics Concern  . None   Social History Narrative   Rhonda Spencer lives in an apartment in [place]. Rhonda Spencer has children, grandchildren and great grandchildren in the area.     Family History:  The patient's family history includes Bone cancer in her maternal uncle; Diabetes in her daughter, sister, and son; Heart disease in her mother; Hypertension in her mother; Pneumonia in her father; Stroke in her mother; Tuberculosis in her sister.     Review of Systems:   Please see the history of present illness.     General:  No chills, fever, night sweats or weight changes.  Positive for fatigue (improving).  Cardiovascular:  No chest pain, dyspnea on exertion, edema, orthopnea, palpitations, paroxysmal nocturnal dyspnea. Dermatological: No rash, lesions/masses Respiratory: No cough, dyspnea Urologic: No hematuria, dysuria Abdominal:   No nausea, vomiting, diarrhea, bright red blood per rectum, melena, or hematemesis Neurologic:  No visual changes, wkns, changes in mental status. All other systems reviewed and are otherwise negative except as noted above.   Physical Exam:    VS:  BP 119/70   Pulse 90   Ht 5' 4"  (1.626 m)   Wt 124 lb 3.2 oz (56.3 kg)   BMI 21.32 kg/m    General: Well developed, African American female appearing in no acute distress. Head: Normocephalic, atraumatic, sclera non-icteric, no xanthomas, nares are without discharge.  Neck: No carotid bruits. JVD not elevated.  Lungs: Respirations regular and unlabored, without wheezes or rales.  Heart: Regular rate and rhythm. No S3 or S4.  No rubs or gallops appreciated. 2/6 SEM along Apex.  Abdomen: Soft, non-tender, non-distended with normoactive bowel sounds. No hepatomegaly. No rebound/guarding. No obvious abdominal masses. Surgical incisions appear well-healing with no erythema or drainage noted.  Msk:  Strength and tone appear normal for age. No joint deformities or effusions. Extremities: No clubbing or cyanosis. No edema.  Distal pedal pulses are 2+ bilaterally. Neuro: Alert and oriented X 3. Moves all extremities spontaneously. No focal deficits noted. Psych:  Responds to questions appropriately with a normal affect. Skin: No rashes or lesions noted  Wt Readings from Last 3 Encounters:  01/12/17 124 lb 3.2 oz (56.3 kg)  12/30/16 132 lb (59.9 kg)  12/22/16 132 lb 6.4 oz (60.1 kg)      Studies/Labs Reviewed:   EKG:  EKG is not ordered  today.  Recent Labs: 12/20/2016: Magnesium 1.8 12/21/2016: TSH 1.517 12/24/2016: ALT 15 12/25/2016: BUN 11; Creatinine, Ser 0.64; Hemoglobin 10.4; Platelets 337; Potassium 4.4; Sodium 130   Lipid Panel    Component Value Date/Time   TRIG 85 11/30/2016 0403    Additional studies/ records that were reviewed today include:   Echocardiogram: 12/2016 Study Conclusions  - Left ventricle: The cavity size was normal. Wall thickness was   increased in a pattern of mild LVH. There was moderate focal   basal hypertrophy of the septum. Systolic function was  normal.   The estimated ejection fraction was in the range of 60% to 65%.   Doppler parameters are consistent with abnormal left ventricular   relaxation (grade 1 diastolic dysfunction). - Aortic valve: Trileaflet. Sclerosis without stenosis. There is a   possible mobile filamentous structure on the outflow side of the   aortic valve (seen in image 17 and 22), which could be consistent   with vegetation. Mild to moderate regurgitation. - Mitral valve: Mildly thickened leaflets . There was mild   regurgitation. - Left atrium: The atrium was normal in size. - Tricuspid valve: There was moderate regurgitation. - Pulmonary arteries: PA peak pressure: 40 mm Hg (S). - Inferior vena cava: The vessel was normal in size. The   respirophasic diameter changes were in the normal range (>= 50%),   consistent with normal central venous pressure. - Pericardium, extracardiac: There was no pericardial effusion.  Impressions:  - Compared to a prior study in 10/2016, the LVEF is higher. 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 There is a suggestion of a   filmatenous structure associated with the outflow tract of the   aortic valve which may represent vegetation in a patient with   endocarditis. Recommend TEE for better visualization of the   aortic valve.  Assessment:    1. Bacteremia associated with IV line, subsequent encounter   2.  Rheumatic mitral regurgitation   3. Essential hypertension   4. Malignant neoplasm of head of pancreas ([place])      Plan:   In order of problems listed above:  1. MRSE Bacteremia - recently admitted for sepsis secondary to HCAP and UTI and found to have MRSE bacteremia.  - echo during admission showed a preserved EF of 60-65%, Grade 1 DD, possible aortic vegetation, mild to moderate AI, and mild MR. TEE was deferred and Rhonda Spencer was continued on Vancomycin for 2 weeks per Infectious Disease recommendations.   - discussed echo results with the patient. Will obtain a repeat limited echocardiogram for reassessment of her aortic valve. If vegetation noted, would recommend TEE for definitive assessment.   2. Rheumatic Mitral regurgitation - previously moderate to severe but mild by her most recent echocardiogram. - continue to follow as an outpatient.   3. HTN - prior antihypertensive medications were discontinued following her recent Whipple procedure secondary to hypotension. - BP remains stable at 119/70 during today's visit. At goal.   4. Adenocarcinoma of the pancreas - s/p pancreaticoduodenectomy in 11/2016 - being followed by Surgery and Oncology.    Medication Adjustments/Labs and Tests Ordered: Current medicines are reviewed at length with the patient today.  Concerns regarding medicines are outlined above.  Medication changes, Labs and Tests ordered today are listed in the Patient Instructions below. Patient Instructions  Medication Instructions: Your physician recommends that you continue on your current medications as directed. Please refer to the Current Medication list given to you today.  Testing/Procedures: Your physician has requested that you have a limited echocardiogram. Echocardiography is a painless test that uses sound waves to create images of your heart. It provides your doctor with information about the size and shape of your heart and how well your heart's chambers  and valves are working. This procedure takes approximately one hour. There are no restrictions for this procedure.  Follow-Up: Your physician wants you to follow-up in: 6 months with Dr. Debara Pickett. You will receive a reminder letter in the mail two months in advance. If you don't receive a letter, please  call our office to schedule the follow-up appointment.  If you need a refill on your cardiac medications before your next appointment, please call your pharmacy.    Signed, Erma Heritage, PA-C  01/12/2017 8:53 PM    [address], [place], [place]  [address] Phone: [phone]; Fax: [phone]  [address], [place], [place] [address] Phone: [phone]

## 2017-01-12 ENCOUNTER — Other Ambulatory Visit: Payer: Self-pay | Admitting: Student

## 2017-01-12 ENCOUNTER — Encounter: Payer: Self-pay | Admitting: Student

## 2017-01-12 ENCOUNTER — Ambulatory Visit (INDEPENDENT_AMBULATORY_CARE_PROVIDER_SITE_OTHER): Payer: Medicare Other | Admitting: Student

## 2017-01-12 VITALS — BP 119/70 | HR 90 | Ht 64.0 in | Wt 124.2 lb

## 2017-01-12 DIAGNOSIS — C250 Malignant neoplasm of head of pancreas: Secondary | ICD-10-CM | POA: Diagnosis not present

## 2017-01-12 DIAGNOSIS — N390 Urinary tract infection, site not specified: Secondary | ICD-10-CM | POA: Diagnosis not present

## 2017-01-12 DIAGNOSIS — I330 Acute and subacute infective endocarditis: Secondary | ICD-10-CM | POA: Diagnosis not present

## 2017-01-12 DIAGNOSIS — R7881 Bacteremia: Secondary | ICD-10-CM

## 2017-01-12 DIAGNOSIS — T827XXA Infection and inflammatory reaction due to other cardiac and vascular devices, implants and grafts, initial encounter: Secondary | ICD-10-CM | POA: Diagnosis not present

## 2017-01-12 DIAGNOSIS — I051 Rheumatic mitral insufficiency: Secondary | ICD-10-CM

## 2017-01-12 DIAGNOSIS — I10 Essential (primary) hypertension: Secondary | ICD-10-CM | POA: Diagnosis not present

## 2017-01-12 DIAGNOSIS — T827XXD Infection and inflammatory reaction due to other cardiac and vascular devices, implants and grafts, subsequent encounter: Secondary | ICD-10-CM | POA: Diagnosis not present

## 2017-01-12 DIAGNOSIS — J189 Pneumonia, unspecified organism: Secondary | ICD-10-CM | POA: Diagnosis not present

## 2017-01-12 DIAGNOSIS — C259 Malignant neoplasm of pancreas, unspecified: Secondary | ICD-10-CM | POA: Diagnosis not present

## 2017-01-12 DIAGNOSIS — E119 Type 2 diabetes mellitus without complications: Secondary | ICD-10-CM | POA: Diagnosis not present

## 2017-01-12 NOTE — Patient Instructions (Signed)
Medication Instructions: Your physician recommends that you continue on your current medications as directed. Please refer to the Current Medication list given to you today.  Testing/Procedures: Your physician has requested that you have a limited echocardiogram. Echocardiography is a painless test that uses sound waves to create images of your heart. It provides your doctor with information about the size and shape of your heart and how well your heart's chambers and valves are working. This procedure takes approximately one hour. There are no restrictions for this procedure.  Follow-Up: Your physician wants you to follow-up in: 6 months with Dr. Debara Pickett. You will receive a reminder letter in the mail two months in advance. If you don't receive a letter, please call our office to schedule the follow-up appointment.  If you need a refill on your cardiac medications before your next appointment, please call your pharmacy.

## 2017-01-14 DIAGNOSIS — C259 Malignant neoplasm of pancreas, unspecified: Secondary | ICD-10-CM | POA: Diagnosis not present

## 2017-01-14 DIAGNOSIS — J189 Pneumonia, unspecified organism: Secondary | ICD-10-CM | POA: Diagnosis not present

## 2017-01-14 DIAGNOSIS — T827XXA Infection and inflammatory reaction due to other cardiac and vascular devices, implants and grafts, initial encounter: Secondary | ICD-10-CM | POA: Diagnosis not present

## 2017-01-14 DIAGNOSIS — I330 Acute and subacute infective endocarditis: Secondary | ICD-10-CM | POA: Diagnosis not present

## 2017-01-14 DIAGNOSIS — N390 Urinary tract infection, site not specified: Secondary | ICD-10-CM | POA: Diagnosis not present

## 2017-01-14 DIAGNOSIS — E119 Type 2 diabetes mellitus without complications: Secondary | ICD-10-CM | POA: Diagnosis not present

## 2017-01-16 ENCOUNTER — Other Ambulatory Visit: Payer: Self-pay

## 2017-01-16 NOTE — Patient Outreach (Unsigned)
[facility]) Care Management  01/16/17  TRISHIA CUTHRELL 1940/08/12 [id number]   Successful outreach completed with patient. Patient identification verified.   Patient stated "I am taking it a day at a time." Stated that she was finally able to get to see a hairdresser and get her hair done now that her PICC line was removed. Stated that the nurse from [facility] came out and took the PICC line out one day this week.  She stated that someone from [facility] sent someone to do an assessment and had her walking up and down the stairs. She stated that they are going to start sending someone out for physical therapy to help her with her strengthening. She stated that she is just trying to do things to get her stronger.   She also reported that she had a "bout of diarrhea" today, but thinks it is from what she ate last night. She reported that she had a snack around 9 pm of peanut butter on a hawaiian roll and she normally does not eat peanut butter. RNCM encouraged her to call doctor if she has continued diarrhea and she verbalized understanding. She stated that her stools are still orange in color as they were before she went in for the surgery.   Patient currently has no complaints or concerns. Home visit scheduled for next week to follow up with patient.  [place] R. Quinnlan Abruzzo, RN, BSN, [place] Management Coordinator [phone]

## 2017-01-19 ENCOUNTER — Ambulatory Visit (HOSPITAL_COMMUNITY): Payer: Medicare Other | Attending: Cardiovascular Disease

## 2017-01-19 ENCOUNTER — Other Ambulatory Visit: Payer: Self-pay | Admitting: Student

## 2017-01-19 DIAGNOSIS — I082 Rheumatic disorders of both aortic and tricuspid valves: Secondary | ICD-10-CM | POA: Insufficient documentation

## 2017-01-19 DIAGNOSIS — T827XXA Infection and inflammatory reaction due to other cardiac and vascular devices, implants and grafts, initial encounter: Secondary | ICD-10-CM | POA: Diagnosis not present

## 2017-01-19 DIAGNOSIS — I5030 Unspecified diastolic (congestive) heart failure: Secondary | ICD-10-CM | POA: Insufficient documentation

## 2017-01-19 DIAGNOSIS — R7881 Bacteremia: Secondary | ICD-10-CM

## 2017-01-19 DIAGNOSIS — J189 Pneumonia, unspecified organism: Secondary | ICD-10-CM | POA: Diagnosis not present

## 2017-01-19 DIAGNOSIS — N390 Urinary tract infection, site not specified: Secondary | ICD-10-CM | POA: Diagnosis not present

## 2017-01-19 DIAGNOSIS — T827XXD Infection and inflammatory reaction due to other cardiac and vascular devices, implants and grafts, subsequent encounter: Secondary | ICD-10-CM

## 2017-01-19 DIAGNOSIS — C259 Malignant neoplasm of pancreas, unspecified: Secondary | ICD-10-CM | POA: Diagnosis not present

## 2017-01-19 DIAGNOSIS — E119 Type 2 diabetes mellitus without complications: Secondary | ICD-10-CM | POA: Diagnosis not present

## 2017-01-19 DIAGNOSIS — I330 Acute and subacute infective endocarditis: Secondary | ICD-10-CM | POA: Diagnosis not present

## 2017-01-20 ENCOUNTER — Other Ambulatory Visit: Payer: Self-pay

## 2017-01-20 NOTE — Patient Outreach (Incomplete)
[facility]) Care Management   01/20/2017  DARINDA STUTEVILLE [birth year] [id number]   Home visit completed with patient.  RINDY KOLLMAN is an 76 y.o. female  Subjective:   Objective:   ROS  Physical Exam  Encounter Medications:   Outpatient Encounter Prescriptions as of 01/20/2017  Medication Sig  . acetaminophen (TYLENOL) 325 MG tablet Take 2 tablets (650 mg total) by mouth every 4 (four) hours as needed for mild pain, moderate pain, fever or headache. (Patient taking differently: Take 650 mg by mouth every 4 (four) hours as needed (for elevated temperature or pain). )  . aspirin EC 81 MG tablet Take 81 mg by mouth daily.  . felodipine (PLENDIL) 5 MG 24 hr tablet Take 5 mg by mouth daily.   No facility-administered encounter medications on file as of 01/20/2017.     Functional Status:   In your present state of health, do you have any difficulty performing the following activities: 12/18/2016 11/14/2016  Hearing? N N  Vision? N N  Difficulty concentrating or making decisions? N N  Walking or climbing stairs? Y Y  Dressing or bathing? N N  Doing errands, shopping? Y Y  Some recent data might be hidden    Fall/Depression Screening:    No flowsheet data found. No flowsheet data found.  Assessment:    Plan:

## 2017-01-22 DIAGNOSIS — I330 Acute and subacute infective endocarditis: Secondary | ICD-10-CM | POA: Diagnosis not present

## 2017-01-22 DIAGNOSIS — J189 Pneumonia, unspecified organism: Secondary | ICD-10-CM | POA: Diagnosis not present

## 2017-01-22 DIAGNOSIS — T827XXA Infection and inflammatory reaction due to other cardiac and vascular devices, implants and grafts, initial encounter: Secondary | ICD-10-CM | POA: Diagnosis not present

## 2017-01-22 DIAGNOSIS — N390 Urinary tract infection, site not specified: Secondary | ICD-10-CM | POA: Diagnosis not present

## 2017-01-22 DIAGNOSIS — E119 Type 2 diabetes mellitus without complications: Secondary | ICD-10-CM | POA: Diagnosis not present

## 2017-01-22 DIAGNOSIS — C259 Malignant neoplasm of pancreas, unspecified: Secondary | ICD-10-CM | POA: Diagnosis not present

## 2017-01-22 DIAGNOSIS — Z1231 Encounter for screening mammogram for malignant neoplasm of breast: Secondary | ICD-10-CM | POA: Diagnosis not present

## 2017-01-23 DIAGNOSIS — J189 Pneumonia, unspecified organism: Secondary | ICD-10-CM | POA: Diagnosis not present

## 2017-01-23 DIAGNOSIS — T827XXA Infection and inflammatory reaction due to other cardiac and vascular devices, implants and grafts, initial encounter: Secondary | ICD-10-CM | POA: Diagnosis not present

## 2017-01-23 DIAGNOSIS — I330 Acute and subacute infective endocarditis: Secondary | ICD-10-CM | POA: Diagnosis not present

## 2017-01-23 DIAGNOSIS — C259 Malignant neoplasm of pancreas, unspecified: Secondary | ICD-10-CM | POA: Diagnosis not present

## 2017-01-23 DIAGNOSIS — R197 Diarrhea, unspecified: Secondary | ICD-10-CM | POA: Diagnosis not present

## 2017-01-23 DIAGNOSIS — N390 Urinary tract infection, site not specified: Secondary | ICD-10-CM | POA: Diagnosis not present

## 2017-01-23 DIAGNOSIS — E119 Type 2 diabetes mellitus without complications: Secondary | ICD-10-CM | POA: Diagnosis not present

## 2017-01-26 DIAGNOSIS — C259 Malignant neoplasm of pancreas, unspecified: Secondary | ICD-10-CM | POA: Diagnosis not present

## 2017-01-26 DIAGNOSIS — T827XXA Infection and inflammatory reaction due to other cardiac and vascular devices, implants and grafts, initial encounter: Secondary | ICD-10-CM | POA: Diagnosis not present

## 2017-01-26 DIAGNOSIS — J189 Pneumonia, unspecified organism: Secondary | ICD-10-CM | POA: Diagnosis not present

## 2017-01-26 DIAGNOSIS — E119 Type 2 diabetes mellitus without complications: Secondary | ICD-10-CM | POA: Diagnosis not present

## 2017-01-26 DIAGNOSIS — N390 Urinary tract infection, site not specified: Secondary | ICD-10-CM | POA: Diagnosis not present

## 2017-01-26 DIAGNOSIS — I330 Acute and subacute infective endocarditis: Secondary | ICD-10-CM | POA: Diagnosis not present

## 2017-01-29 ENCOUNTER — Other Ambulatory Visit: Payer: Self-pay | Admitting: Pharmacist

## 2017-01-30 DIAGNOSIS — N390 Urinary tract infection, site not specified: Secondary | ICD-10-CM | POA: Diagnosis not present

## 2017-01-30 DIAGNOSIS — C259 Malignant neoplasm of pancreas, unspecified: Secondary | ICD-10-CM | POA: Diagnosis not present

## 2017-01-30 DIAGNOSIS — E119 Type 2 diabetes mellitus without complications: Secondary | ICD-10-CM | POA: Diagnosis not present

## 2017-01-30 DIAGNOSIS — I330 Acute and subacute infective endocarditis: Secondary | ICD-10-CM | POA: Diagnosis not present

## 2017-01-30 DIAGNOSIS — T827XXA Infection and inflammatory reaction due to other cardiac and vascular devices, implants and grafts, initial encounter: Secondary | ICD-10-CM | POA: Diagnosis not present

## 2017-01-30 DIAGNOSIS — J189 Pneumonia, unspecified organism: Secondary | ICD-10-CM | POA: Diagnosis not present

## 2017-02-02 DIAGNOSIS — N390 Urinary tract infection, site not specified: Secondary | ICD-10-CM | POA: Diagnosis not present

## 2017-02-02 DIAGNOSIS — E119 Type 2 diabetes mellitus without complications: Secondary | ICD-10-CM | POA: Diagnosis not present

## 2017-02-02 DIAGNOSIS — T827XXA Infection and inflammatory reaction due to other cardiac and vascular devices, implants and grafts, initial encounter: Secondary | ICD-10-CM | POA: Diagnosis not present

## 2017-02-02 DIAGNOSIS — I330 Acute and subacute infective endocarditis: Secondary | ICD-10-CM | POA: Diagnosis not present

## 2017-02-02 DIAGNOSIS — C259 Malignant neoplasm of pancreas, unspecified: Secondary | ICD-10-CM | POA: Diagnosis not present

## 2017-02-02 DIAGNOSIS — J189 Pneumonia, unspecified organism: Secondary | ICD-10-CM | POA: Diagnosis not present

## 2017-02-05 DIAGNOSIS — N390 Urinary tract infection, site not specified: Secondary | ICD-10-CM | POA: Diagnosis not present

## 2017-02-05 DIAGNOSIS — I330 Acute and subacute infective endocarditis: Secondary | ICD-10-CM | POA: Diagnosis not present

## 2017-02-05 DIAGNOSIS — J189 Pneumonia, unspecified organism: Secondary | ICD-10-CM | POA: Diagnosis not present

## 2017-02-05 DIAGNOSIS — C259 Malignant neoplasm of pancreas, unspecified: Secondary | ICD-10-CM | POA: Diagnosis not present

## 2017-02-05 DIAGNOSIS — E119 Type 2 diabetes mellitus without complications: Secondary | ICD-10-CM | POA: Diagnosis not present

## 2017-02-05 DIAGNOSIS — T827XXA Infection and inflammatory reaction due to other cardiac and vascular devices, implants and grafts, initial encounter: Secondary | ICD-10-CM | POA: Diagnosis not present

## 2017-02-09 DIAGNOSIS — E119 Type 2 diabetes mellitus without complications: Secondary | ICD-10-CM | POA: Diagnosis not present

## 2017-02-09 DIAGNOSIS — I330 Acute and subacute infective endocarditis: Secondary | ICD-10-CM | POA: Diagnosis not present

## 2017-02-09 DIAGNOSIS — T827XXA Infection and inflammatory reaction due to other cardiac and vascular devices, implants and grafts, initial encounter: Secondary | ICD-10-CM | POA: Diagnosis not present

## 2017-02-09 DIAGNOSIS — C259 Malignant neoplasm of pancreas, unspecified: Secondary | ICD-10-CM | POA: Diagnosis not present

## 2017-02-09 DIAGNOSIS — J189 Pneumonia, unspecified organism: Secondary | ICD-10-CM | POA: Diagnosis not present

## 2017-02-09 DIAGNOSIS — N390 Urinary tract infection, site not specified: Secondary | ICD-10-CM | POA: Diagnosis not present

## 2017-02-12 DIAGNOSIS — J189 Pneumonia, unspecified organism: Secondary | ICD-10-CM | POA: Diagnosis not present

## 2017-02-12 DIAGNOSIS — T827XXA Infection and inflammatory reaction due to other cardiac and vascular devices, implants and grafts, initial encounter: Secondary | ICD-10-CM | POA: Diagnosis not present

## 2017-02-12 DIAGNOSIS — I330 Acute and subacute infective endocarditis: Secondary | ICD-10-CM | POA: Diagnosis not present

## 2017-02-12 DIAGNOSIS — C259 Malignant neoplasm of pancreas, unspecified: Secondary | ICD-10-CM | POA: Diagnosis not present

## 2017-02-12 DIAGNOSIS — N390 Urinary tract infection, site not specified: Secondary | ICD-10-CM | POA: Diagnosis not present

## 2017-02-12 DIAGNOSIS — E119 Type 2 diabetes mellitus without complications: Secondary | ICD-10-CM | POA: Diagnosis not present

## 2017-02-16 DIAGNOSIS — C259 Malignant neoplasm of pancreas, unspecified: Secondary | ICD-10-CM | POA: Diagnosis not present

## 2017-02-16 DIAGNOSIS — N390 Urinary tract infection, site not specified: Secondary | ICD-10-CM | POA: Diagnosis not present

## 2017-02-16 DIAGNOSIS — E119 Type 2 diabetes mellitus without complications: Secondary | ICD-10-CM | POA: Diagnosis not present

## 2017-02-16 DIAGNOSIS — I330 Acute and subacute infective endocarditis: Secondary | ICD-10-CM | POA: Diagnosis not present

## 2017-02-16 DIAGNOSIS — T827XXA Infection and inflammatory reaction due to other cardiac and vascular devices, implants and grafts, initial encounter: Secondary | ICD-10-CM | POA: Diagnosis not present

## 2017-02-16 DIAGNOSIS — J189 Pneumonia, unspecified organism: Secondary | ICD-10-CM | POA: Diagnosis not present

## 2017-02-17 DIAGNOSIS — E119 Type 2 diabetes mellitus without complications: Secondary | ICD-10-CM | POA: Diagnosis not present

## 2017-02-17 DIAGNOSIS — R634 Abnormal weight loss: Secondary | ICD-10-CM | POA: Diagnosis not present

## 2017-02-17 DIAGNOSIS — T827XXA Infection and inflammatory reaction due to other cardiac and vascular devices, implants and grafts, initial encounter: Secondary | ICD-10-CM | POA: Diagnosis not present

## 2017-02-17 DIAGNOSIS — J189 Pneumonia, unspecified organism: Secondary | ICD-10-CM | POA: Diagnosis not present

## 2017-02-17 DIAGNOSIS — I330 Acute and subacute infective endocarditis: Secondary | ICD-10-CM | POA: Diagnosis not present

## 2017-02-17 DIAGNOSIS — N390 Urinary tract infection, site not specified: Secondary | ICD-10-CM | POA: Diagnosis not present

## 2017-02-17 DIAGNOSIS — C259 Malignant neoplasm of pancreas, unspecified: Secondary | ICD-10-CM | POA: Diagnosis not present

## 2017-02-17 DIAGNOSIS — I10 Essential (primary) hypertension: Secondary | ICD-10-CM | POA: Diagnosis not present

## 2017-02-19 ENCOUNTER — Other Ambulatory Visit: Payer: Self-pay | Admitting: Family Medicine

## 2017-02-19 DIAGNOSIS — R918 Other nonspecific abnormal finding of lung field: Secondary | ICD-10-CM

## 2017-02-25 ENCOUNTER — Ambulatory Visit
Admission: RE | Admit: 2017-02-25 | Discharge: 2017-02-25 | Disposition: A | Discharge status: Home / Self Care | Payer: Medicare Other | Source: Ambulatory Visit | Attending: Family Medicine | Admitting: Family Medicine

## 2017-02-25 ENCOUNTER — Other Ambulatory Visit: Payer: Self-pay | Admitting: Family Medicine

## 2017-02-25 DIAGNOSIS — R918 Other nonspecific abnormal finding of lung field: Secondary | ICD-10-CM

## 2017-02-25 DIAGNOSIS — R911 Solitary pulmonary nodule: Secondary | ICD-10-CM | POA: Diagnosis not present

## 2017-03-08 ENCOUNTER — Emergency Department (HOSPITAL_COMMUNITY): Payer: Medicare Other

## 2017-03-08 ENCOUNTER — Inpatient Hospital Stay (HOSPITAL_COMMUNITY)
Admission: EM | Admit: 2017-03-08 | Discharge: 2017-03-13 | DRG: 435 | Disposition: A | Discharge status: Hospice | Payer: Medicare Other | Attending: Internal Medicine | Admitting: Internal Medicine

## 2017-03-08 ENCOUNTER — Inpatient Hospital Stay (HOSPITAL_COMMUNITY): Payer: Medicare Other

## 2017-03-08 ENCOUNTER — Encounter (HOSPITAL_COMMUNITY): Payer: Self-pay | Admitting: *Deleted

## 2017-03-08 DIAGNOSIS — Z515 Encounter for palliative care: Secondary | ICD-10-CM | POA: Diagnosis present

## 2017-03-08 DIAGNOSIS — E876 Hypokalemia: Secondary | ICD-10-CM | POA: Diagnosis present

## 2017-03-08 DIAGNOSIS — C259 Malignant neoplasm of pancreas, unspecified: Secondary | ICD-10-CM | POA: Diagnosis not present

## 2017-03-08 DIAGNOSIS — I340 Nonrheumatic mitral (valve) insufficiency: Secondary | ICD-10-CM | POA: Diagnosis present

## 2017-03-08 DIAGNOSIS — R079 Chest pain, unspecified: Secondary | ICD-10-CM | POA: Diagnosis not present

## 2017-03-08 DIAGNOSIS — C250 Malignant neoplasm of head of pancreas: Secondary | ICD-10-CM | POA: Diagnosis present

## 2017-03-08 DIAGNOSIS — I249 Acute ischemic heart disease, unspecified: Secondary | ICD-10-CM | POA: Diagnosis not present

## 2017-03-08 DIAGNOSIS — R140 Abdominal distension (gaseous): Secondary | ICD-10-CM | POA: Diagnosis not present

## 2017-03-08 DIAGNOSIS — I2699 Other pulmonary embolism without acute cor pulmonale: Secondary | ICD-10-CM | POA: Diagnosis not present

## 2017-03-08 DIAGNOSIS — D649 Anemia, unspecified: Secondary | ICD-10-CM | POA: Diagnosis not present

## 2017-03-08 DIAGNOSIS — R627 Adult failure to thrive: Secondary | ICD-10-CM | POA: Diagnosis present

## 2017-03-08 DIAGNOSIS — G893 Neoplasm related pain (acute) (chronic): Secondary | ICD-10-CM | POA: Diagnosis not present

## 2017-03-08 DIAGNOSIS — C787 Secondary malignant neoplasm of liver and intrahepatic bile duct: Principal | ICD-10-CM | POA: Diagnosis present

## 2017-03-08 DIAGNOSIS — I351 Nonrheumatic aortic (valve) insufficiency: Secondary | ICD-10-CM | POA: Diagnosis not present

## 2017-03-08 DIAGNOSIS — R1013 Epigastric pain: Secondary | ICD-10-CM | POA: Diagnosis present

## 2017-03-08 DIAGNOSIS — I099 Rheumatic heart disease, unspecified: Secondary | ICD-10-CM | POA: Diagnosis present

## 2017-03-08 DIAGNOSIS — M069 Rheumatoid arthritis, unspecified: Secondary | ICD-10-CM | POA: Diagnosis present

## 2017-03-08 DIAGNOSIS — Z794 Long term (current) use of insulin: Secondary | ICD-10-CM | POA: Diagnosis not present

## 2017-03-08 DIAGNOSIS — R630 Anorexia: Secondary | ICD-10-CM | POA: Diagnosis not present

## 2017-03-08 DIAGNOSIS — R634 Abnormal weight loss: Secondary | ICD-10-CM | POA: Diagnosis not present

## 2017-03-08 DIAGNOSIS — R918 Other nonspecific abnormal finding of lung field: Secondary | ICD-10-CM | POA: Diagnosis not present

## 2017-03-08 DIAGNOSIS — Z888 Allergy status to other drugs, medicaments and biological substances status: Secondary | ICD-10-CM | POA: Diagnosis not present

## 2017-03-08 DIAGNOSIS — E119 Type 2 diabetes mellitus without complications: Secondary | ICD-10-CM

## 2017-03-08 DIAGNOSIS — R9431 Abnormal electrocardiogram [ECG] [EKG]: Secondary | ICD-10-CM | POA: Diagnosis not present

## 2017-03-08 DIAGNOSIS — Z681 Body mass index (BMI) 19 or less, adult: Secondary | ICD-10-CM | POA: Diagnosis not present

## 2017-03-08 DIAGNOSIS — I083 Combined rheumatic disorders of mitral, aortic and tricuspid valves: Secondary | ICD-10-CM | POA: Diagnosis present

## 2017-03-08 DIAGNOSIS — K529 Noninfective gastroenteritis and colitis, unspecified: Secondary | ICD-10-CM | POA: Diagnosis present

## 2017-03-08 DIAGNOSIS — Z90411 Acquired partial absence of pancreas: Secondary | ICD-10-CM

## 2017-03-08 DIAGNOSIS — K219 Gastro-esophageal reflux disease without esophagitis: Secondary | ICD-10-CM | POA: Diagnosis not present

## 2017-03-08 DIAGNOSIS — R109 Unspecified abdominal pain: Secondary | ICD-10-CM | POA: Diagnosis not present

## 2017-03-08 DIAGNOSIS — Z7982 Long term (current) use of aspirin: Secondary | ICD-10-CM

## 2017-03-08 DIAGNOSIS — I10 Essential (primary) hypertension: Secondary | ICD-10-CM | POA: Diagnosis not present

## 2017-03-08 DIAGNOSIS — I071 Rheumatic tricuspid insufficiency: Secondary | ICD-10-CM | POA: Diagnosis present

## 2017-03-08 DIAGNOSIS — E118 Type 2 diabetes mellitus with unspecified complications: Secondary | ICD-10-CM | POA: Diagnosis not present

## 2017-03-08 DIAGNOSIS — Z79899 Other long term (current) drug therapy: Secondary | ICD-10-CM

## 2017-03-08 DIAGNOSIS — I361 Nonrheumatic tricuspid (valve) insufficiency: Secondary | ICD-10-CM | POA: Diagnosis not present

## 2017-03-08 DIAGNOSIS — M059 Rheumatoid arthritis with rheumatoid factor, unspecified: Secondary | ICD-10-CM | POA: Diagnosis not present

## 2017-03-08 DIAGNOSIS — Z91041 Radiographic dye allergy status: Secondary | ICD-10-CM

## 2017-03-08 DIAGNOSIS — I051 Rheumatic mitral insufficiency: Secondary | ICD-10-CM | POA: Diagnosis not present

## 2017-03-08 DIAGNOSIS — G8929 Other chronic pain: Secondary | ICD-10-CM | POA: Diagnosis present

## 2017-03-08 DIAGNOSIS — D630 Anemia in neoplastic disease: Secondary | ICD-10-CM | POA: Diagnosis present

## 2017-03-08 DIAGNOSIS — Z87891 Personal history of nicotine dependence: Secondary | ICD-10-CM

## 2017-03-08 DIAGNOSIS — E43 Unspecified severe protein-calorie malnutrition: Secondary | ICD-10-CM | POA: Diagnosis present

## 2017-03-08 DIAGNOSIS — R5381 Other malaise: Secondary | ICD-10-CM | POA: Diagnosis present

## 2017-03-08 DIAGNOSIS — R1010 Upper abdominal pain, unspecified: Secondary | ICD-10-CM | POA: Diagnosis not present

## 2017-03-08 DIAGNOSIS — Z9049 Acquired absence of other specified parts of digestive tract: Secondary | ICD-10-CM

## 2017-03-08 DIAGNOSIS — Z8614 Personal history of Methicillin resistant Staphylococcus aureus infection: Secondary | ICD-10-CM

## 2017-03-08 LAB — CBC
HCT: 32.1 % — ABNORMAL LOW (ref 36.0–46.0)
Hemoglobin: 10.6 g/dL — ABNORMAL LOW (ref 12.0–15.0)
MCH: 28.7 pg (ref 26.0–34.0)
MCHC: 33 g/dL (ref 30.0–36.0)
MCV: 87 fL (ref 78.0–100.0)
PLATELETS: 266 10*3/uL (ref 150–400)
RBC: 3.69 MIL/uL — AB (ref 3.87–5.11)
RDW: 16.4 % — ABNORMAL HIGH (ref 11.5–15.5)
WBC: 7.8 10*3/uL (ref 4.0–10.5)

## 2017-03-08 LAB — CBG MONITORING, ED
GLUCOSE-CAPILLARY: 118 mg/dL — AB (ref 65–99)
Glucose-Capillary: 104 mg/dL — ABNORMAL HIGH (ref 65–99)

## 2017-03-08 LAB — TROPONIN I
Troponin I: 0.21 ng/mL (ref <0.03)
Troponin I: 0.2 ng/mL (ref <0.03)

## 2017-03-08 LAB — PREALBUMIN: PREALBUMIN: 6.4 mg/dL — AB (ref 18–38)

## 2017-03-08 LAB — BASIC METABOLIC PANEL
Anion gap: 10 (ref 5–15)
BUN: 11 mg/dL (ref 6–20)
CALCIUM: 8.6 mg/dL — AB (ref 8.9–10.3)
CO2: 24 mmol/L (ref 22–32)
CREATININE: 0.75 mg/dL (ref 0.44–1.00)
Chloride: 102 mmol/L (ref 101–111)
GFR calc non Af Amer: >60 mL/min (ref >60)
Glucose, Bld: 115 mg/dL — ABNORMAL HIGH (ref 65–99)
Potassium: 3.7 mmol/L (ref 3.5–5.1)
SODIUM: 136 mmol/L (ref 135–145)

## 2017-03-08 LAB — I-STAT TROPONIN, ED: TROPONIN I, POC: 0.25 ng/mL — AB (ref 0.00–0.08)

## 2017-03-08 LAB — POC OCCULT BLOOD, ED: FECAL OCCULT BLD: NEGATIVE

## 2017-03-08 MED ORDER — ACETAMINOPHEN 325 MG PO TABS: 650.0000 mg | ORAL_TABLET | Freq: Four times a day (QID) | ORAL | Status: DC | PRN

## 2017-03-08 MED ORDER — ONDANSETRON HCL 4 MG/2ML IJ SOLN
4.0000 mg | Freq: Four times a day (QID) | INTRAMUSCULAR | Status: DC | PRN
  Filled 2017-03-08: qty 2

## 2017-03-08 MED ORDER — ALPRAZOLAM 0.25 MG PO TABS: 0.2500 mg | ORAL_TABLET | Freq: Two times a day (BID) | ORAL | Status: DC | PRN

## 2017-03-08 MED ORDER — OXYCODONE HCL 5 MG PO TABS
5.0000 mg | ORAL_TABLET | ORAL | Status: DC | PRN
  Administered 2017-03-10 (×3): 10 mg via ORAL
  Administered 2017-03-11: 5 mg via ORAL
  Administered 2017-03-11 – 2017-03-12 (×2): 10 mg via ORAL
  Filled 2017-03-08 (×4): qty 2
  Filled 2017-03-08: qty 1
  Filled 2017-03-08: qty 2

## 2017-03-08 MED ORDER — SODIUM CHLORIDE 0.9 % IV SOLN: INTRAVENOUS | Status: DC

## 2017-03-08 MED ORDER — HYDROCORTISONE NA SUCCINATE PF 250 MG IJ SOLR
200.0000 mg | Freq: Once | INTRAMUSCULAR | Status: AC
  Administered 2017-03-08: 200 mg via INTRAVENOUS
  Filled 2017-03-08: qty 200

## 2017-03-08 MED ORDER — OXYCODONE HCL 5 MG PO TABS
5.0000 mg | ORAL_TABLET | ORAL | Status: DC | PRN
  Administered 2017-03-08: 5 mg via ORAL
  Filled 2017-03-08: qty 1

## 2017-03-08 MED ORDER — ASPIRIN EC 81 MG PO TBEC
81.0000 mg | DELAYED_RELEASE_TABLET | Freq: Every day | ORAL | Status: DC
  Administered 2017-03-08 – 2017-03-09 (×2): 81 mg via ORAL
  Filled 2017-03-08 (×2): qty 1

## 2017-03-08 MED ORDER — DIPHENHYDRAMINE HCL 25 MG PO CAPS: 50.0000 mg | ORAL_CAPSULE | Freq: Once | ORAL | Status: AC

## 2017-03-08 MED ORDER — GI COCKTAIL ~~LOC~~
30.0000 mL | Freq: Once | ORAL | Status: DC
  Filled 2017-03-08: qty 30

## 2017-03-08 MED ORDER — NITROGLYCERIN 0.4 MG SL SUBL
0.4000 mg | SUBLINGUAL_TABLET | SUBLINGUAL | Status: DC | PRN
  Administered 2017-03-08: 0.4 mg via SUBLINGUAL
  Filled 2017-03-08: qty 1

## 2017-03-08 MED ORDER — HYDRALAZINE HCL 20 MG/ML IJ SOLN: 5.0000 mg | Freq: Three times a day (TID) | INTRAMUSCULAR | Status: DC | PRN

## 2017-03-08 MED ORDER — ONDANSETRON HCL 4 MG PO TABS
4.0000 mg | ORAL_TABLET | Freq: Four times a day (QID) | ORAL | Status: DC | PRN
Start: 2017-03-08 — End: 2017-03-13

## 2017-03-08 MED ORDER — OXYCODONE HCL ER 10 MG PO T12A
10.0000 mg | EXTENDED_RELEASE_TABLET | Freq: Two times a day (BID) | ORAL | Status: DC
  Administered 2017-03-08 – 2017-03-11 (×6): 10 mg via ORAL
  Filled 2017-03-08 (×7): qty 1

## 2017-03-08 MED ORDER — IOPAMIDOL (ISOVUE-370) INJECTION 76%
INTRAVENOUS | Status: AC
  Administered 2017-03-08: 100 mL via INTRAVENOUS
  Filled 2017-03-08: qty 100

## 2017-03-08 MED ORDER — DIPHENHYDRAMINE HCL 50 MG/ML IJ SOLN
50.0000 mg | Freq: Once | INTRAMUSCULAR | Status: AC
  Administered 2017-03-08: 50 mg via INTRAVENOUS
  Filled 2017-03-08: qty 1

## 2017-03-08 MED ORDER — HEPARIN BOLUS VIA INFUSION
2000.0000 [IU] | Freq: Once | INTRAVENOUS | Status: AC
  Administered 2017-03-08: 2000 [IU] via INTRAVENOUS
  Filled 2017-03-08: qty 2000

## 2017-03-08 MED ORDER — NITROGLYCERIN 0.4 MG SL SUBL: 0.4000 mg | SUBLINGUAL_TABLET | SUBLINGUAL | Status: DC | PRN

## 2017-03-08 MED ORDER — MORPHINE SULFATE (PF) 4 MG/ML IV SOLN
2.0000 mg | INTRAVENOUS | Status: DC | PRN
  Filled 2017-03-08: qty 1

## 2017-03-08 MED ORDER — PANCRELIPASE (LIP-PROT-AMYL) 12000-38000 UNITS PO CPEP
12000.0000 [IU] | ORAL_CAPSULE | Freq: Three times a day (TID) | ORAL | Status: DC
  Administered 2017-03-08 – 2017-03-13 (×15): 12000 [IU] via ORAL
  Filled 2017-03-08 (×17): qty 1

## 2017-03-08 MED ORDER — GI COCKTAIL ~~LOC~~
30.0000 mL | Freq: Three times a day (TID) | ORAL | Status: DC | PRN
  Administered 2017-03-10: 30 mL via ORAL
  Filled 2017-03-08: qty 30

## 2017-03-08 MED ORDER — PANTOPRAZOLE SODIUM 40 MG IV SOLR
40.0000 mg | INTRAVENOUS | Status: DC
  Administered 2017-03-08 – 2017-03-10 (×3): 40 mg via INTRAVENOUS
  Filled 2017-03-08 (×3): qty 40

## 2017-03-08 MED ORDER — ACETAMINOPHEN 650 MG RE SUPP: 650.0000 mg | Freq: Four times a day (QID) | RECTAL | Status: DC | PRN

## 2017-03-08 MED ORDER — DIPHENHYDRAMINE HCL 25 MG PO CAPS: 25.0000 mg | ORAL_CAPSULE | Freq: Once | ORAL | Status: DC

## 2017-03-08 MED ORDER — INSULIN ASPART 100 UNIT/ML ~~LOC~~ SOLN
0.0000 [IU] | Freq: Three times a day (TID) | SUBCUTANEOUS | Status: DC
  Administered 2017-03-08: 0 [IU] via SUBCUTANEOUS
  Administered 2017-03-09: 1 [IU] via SUBCUTANEOUS
  Filled 2017-03-08: qty 1

## 2017-03-08 MED ORDER — SODIUM CHLORIDE 0.9 % IV SOLN
INTRAVENOUS | Status: DC
  Administered 2017-03-08 – 2017-03-11 (×4): via INTRAVENOUS

## 2017-03-08 MED ORDER — HEPARIN (PORCINE) IN NACL 100-0.45 UNIT/ML-% IJ SOLN
850.0000 [IU]/h | INTRAMUSCULAR | Status: DC
Start: 2017-03-08 — End: 2017-03-09
  Administered 2017-03-08: 600 [IU]/h via INTRAVENOUS
  Filled 2017-03-08: qty 250

## 2017-03-08 MED ORDER — HEPARIN SODIUM (PORCINE) 5000 UNIT/ML IJ SOLN
5000.0000 [IU] | Freq: Three times a day (TID) | INTRAMUSCULAR | Status: DC
  Filled 2017-03-08: qty 1

## 2017-03-08 NOTE — ED Provider Notes (Signed)
[facility] Provider Note   CSN: [id number] Arrival date & time: 03/08/17  1041  History   Chief Complaint Chief Complaint  Patient presents with  . Chest Pain  . visual problems   HPI Rhonda Spencer is a 76 y.o. female with a PMHx significant for Rheumatic heart disease and pancreatic cancer s/p Whipple procedure who presented with recurrent mid-sternal chest pain since undergoing a whipple in 11/2016. She says it is primarily isolated to the epigastric and RUQ. Does not radiate through to the back, shoulder, or neck. The pain is relieved by Oxycodone that she has and appears to be made worse when she takes her medications without food. She is on an antacid at home but is unsure which one. She has never had a MI but did have rheumatic fever as a child and has a "leaky valve." She has never had a CVA but does have HTN. She is compliant with all her medications. She endorses decreased PO intake the last 3 days due to this recurrent pain.   She has no experienced any diaphoresis, N/V, SOB with exertion, recurrent cough, recent URI symptoms, abdominal pain, dysuria, polyuria. She has had constipation the last week but states it resolved with Miralax.   Past Medical History:  Diagnosis Date  . AI (aortic insufficiency)   . Anemia   . History of nuclear stress test 11/2008   bruce myoview; normal pattern of perfusion in allregions, no significant wall motion abnormalities; no ECG changes, no significant ischemia demonstrated, low risk scan   . Hypertension   . MR (mitral regurgitation)   . Rheumatic fever   . Rheumatic heart disease   . Rheumatoid arthritis ([place])   . Sinus bradycardia    Patient Active Problem List   Diagnosis Date Noted  . Chest pain 03/08/2017  . Sepsis ([place]) 12/18/2016  . HCAP (healthcare-associated pneumonia) 12/18/2016  . DM type 2 (diabetes mellitus, type 2) ([place]) 12/18/2016  . Protein-calorie malnutrition, severe 12/18/2016   . Adenocarcinoma of head of pancreas ([place]) 11/13/2016  . Pancreatic cancer ([place]) 10/14/2016  . Sinus bradycardia 09/23/2015  . Rheumatoid arthritis ([place]) 09/23/2015  . Tricuspid regurgitation 09/23/2015  . Near syncope 09/22/2015  . Orthostasis 09/22/2015  . Essential hypertension 09/22/2015  . Chronic pain 09/22/2015  . GERD (gastroesophageal reflux disease) 04/19/2015  . Multiple lung nodules 01/18/2014  . Abdominal pain, chronic, right lower quadrant 10/12/2012  . AI (aortic insufficiency) 09/09/2012  . Mitral regurgitation 09/09/2012  . Rheumatic heart disease 09/09/2012  . HTN (hypertension) 09/09/2012  . Pre-operative cardiovascular examination 09/09/2012  . Abdominal mass, RLQ (right lower quadrant) 09/07/2012   Past Surgical History:  Procedure Laterality Date  . ABDOMINAL HYSTERECTOMY    . ABDOMINAL SURGERY  11/2016   whipple  . CHOLECYSTECTOMY    . COLON SURGERY    . ERCP N/A 10/03/2016   Procedure: ENDOSCOPIC RETROGRADE CHOLANGIOPANCREATOGRAPHY (ERCP);  Surgeon: Carol Ada, MD;  Location: [facility];  Service: Endoscopy;  Laterality: N/A;  . EXCISIONAL HEMORRHOIDECTOMY    . HEMI-MICRODISCECTOMY LUMBAR LAMINECTOMY LEVEL 4  1995  . JEJUNOSTOMY N/A 11/13/2016   Procedure: PLACEMENT JEJUNOSTOMY TUBE;  Surgeon: [name], MD;  Location: [facility];  Service: General;  Laterality: N/A;  . LAPAROSCOPY N/A 11/13/2016   Procedure: ATTEMPTED DAGNOSTIC LAPAROSCOPY;  Surgeon: [name], MD;  Location: [facility];  Service: General;  Laterality: N/A;  . TONSILLECTOMY    . TRANSTHORACIC ECHOCARDIOGRAM  11/2011   EF=>55%; LA mild-mod dilated;  mod-severe MR; mild-mod TR with normal systolic pressure; mild-mod AV regurg; mild pulm valve regurg   . UPPER ESOPHAGEAL ENDOSCOPIC ULTRASOUND (EUS) N/A 09/26/2016   Procedure: UPPER ESOPHAGEAL ENDOSCOPIC ULTRASOUND (EUS);  Surgeon: Carol Ada, MD;  Location: [facility];  Service: Endoscopy;  Laterality: N/A;  . UPPER ESOPHAGEAL ENDOSCOPIC  ULTRASOUND (EUS) N/A 10/03/2016   Procedure: UPPER ESOPHAGEAL ENDOSCOPIC ULTRASOUND (EUS);  Surgeon: Carol Ada, MD;  Location: [facility];  Service: Endoscopy;  Laterality: N/A;  . WHIPPLE PROCEDURE N/A 11/13/2016   Procedure: WHIPPLE PROCEDURE;  Surgeon: [name], MD;  Location: [facility];  Service: General;  Laterality: N/A;   OB History    No data available     Home Medications    Prior to Admission medications   Medication Sig Start Date End Date Taking? Authorizing Provider  acetaminophen (TYLENOL) 325 MG tablet Take 2 tablets (650 mg total) by mouth every 4 (four) hours as needed for mild pain, moderate pain, fever or headache. Patient taking differently: Take 650 mg by mouth every 4 (four) hours as needed (for elevated temperature or pain).  12/04/16  Yes [name], MD  aspirin EC 81 MG tablet Take 81 mg by mouth daily.   Yes [provider]  felodipine (PLENDIL) 5 MG 24 hr tablet Take 5 mg by mouth daily.   Yes [provider]  oxyCODONE-acetaminophen (PERCOCET/ROXICET) 5-325 MG tablet Take 1 tablet by mouth 2 (two) times daily as needed for severe pain.   Yes [provider]  Pancrelipase, Lip-Prot-Amyl, (CREON PO) Take 3 capsules by mouth 3 (three) times daily.   Yes [provider]   Family History Family History  Problem Relation Age of Onset  . Hypertension Mother   . Stroke Mother   . Heart disease Mother   . Pneumonia Father   . Bone cancer Maternal Uncle   . Diabetes Sister        x2; one was step-sister  . Tuberculosis Sister        step-sister  . Diabetes Son   . Diabetes Daughter    Social History Social History   Tobacco Use  . Smoking status: Former Smoker    Years: 8.00    Last attempt to quit: 04/08/1967    Years since quitting: 49.9  . Smokeless tobacco: Never Used  Substance Use Topics  . Alcohol use: No  . Drug use: No   Allergies   Tarka [trandolapril-verapamil hcl er]; Atorvastatin; and Contrast  media [iodinated diagnostic agents]  Review of Systems  All systems reviewed and are negative for acute change except as noted in the HPI.  Physical Exam Updated Vital Signs BP 107/65   Pulse 68   Temp 97.7 F (36.5 C) (Oral)   Resp 16   SpO2 99%   General: Thin, chronically ill appearing female in no acute distress HENT: Atraumatic, normocephalic, moist mucus membranes  Pulm: Good air movement with no wheezing or crackles  CV: RRR, could not appreciate any murmurs or rubs  Abdomen: Active bowel sounds, non-distended, tenderness to palpation of the epigastric and RUQ Skin: No rashes or lesions noted Extremities: No LE edema  Neuro: Alert and oriented x3  EKG: Sinus with normal axis, prolonged PR interval, T wave inversion in leads V1 and III, signs of right heart strain.   CXR: Good inspiration and penetration, no bony or soft tissue abnormalities, no opacities or infiltrates, no mediastinal widening, no pleural effusions, no cardiomegaly.   ED Treatments / Results  Labs (  all labs ordered are listed, but only abnormal results are displayed) Labs Reviewed  BASIC METABOLIC PANEL - Abnormal; Notable for the following components:      Result Value   Glucose, Bld 115 (*)    Calcium 8.6 (*)    All other components within normal limits  CBC - Abnormal; Notable for the following components:   RBC 3.69 (*)    Hemoglobin 10.6 (*)    HCT 32.1 (*)    RDW 16.4 (*)    All other components within normal limits  I-STAT TROPONIN, ED - Abnormal; Notable for the following components:   Troponin i, poc 0.25 (*)    All other components within normal limits  OCCULT BLOOD X 1 CARD TO LAB, STOOL   EKG  EKG Interpretation  Date/Time:  Sunday March 08 2017 12:12:26 EST Ventricular Rate:  65 PR Interval:    QRS Duration: 87 QT Interval:  432 QTC Calculation: 450 R Axis:   69 Text Interpretation:  Sinus rhythm Consider left ventricular hypertrophy Baseline wander in lead(s) V6  evidence of right heart strain with peaked p waves and S1. NO STEMI Otherwise no significant change Confirmed by Addison Lank [phone]) on 03/08/2017 12:27:11 PM Also confirmed by Addison Lank [phone]), editor Laurena Spies [phone])  on 03/08/2017 12:33:23 PM      Radiology Dg Chest 2 View  Result Date: 03/08/2017 CLINICAL DATA:  Chest pain for the past 3 days. EXAM: CHEST  2 VIEW COMPARISON:  Chest x-ray dated December 17, 2016. FINDINGS: Interval removal of the right PICC catheter. The cardiomediastinal silhouette is normal in size. Normal pulmonary vascularity. No focal consolidation, pleural effusion, or pneumothorax. No acute osseous abnormality. IMPRESSION: No active cardiopulmonary disease. Electronically Signed   By: Titus Dubin M.D.   On: 03/08/2017 12:00   Procedures Procedures (including critical care time)  Medications Ordered in ED Medications  nitroGLYCERIN (NITROSTAT) SL tablet 0.4 mg (0.4 mg Sublingual Given 03/08/17 1247)  iopamidol (ISOVUE-370) 76 % injection (not administered)  hydrocortisone sodium succinate (SOLU-CORTEF) injection 200 mg (not administered)  diphenhydrAMINE (BENADRYL) capsule 50 mg (not administered)    Or  diphenhydrAMINE (BENADRYL) injection 50 mg (not administered)   Initial Impression / Assessment and Plan / ED Course  I have reviewed the triage vital signs and the nursing notes.  Pertinent labs & imaging results that were available during my care of the patient were reviewed by me and considered in my medical decision making (see chart for details).    Patient presented with acute chest pain. EKG showing no ST elevation, No LBBB, T wave inversion in leads V1 and III, right heart strain, and first degree heart block. Initial troponin 0.25. She was given nitroglycerin with improvement of her symptoms. Heart Score of 5. Getting fecal occult prior to initiation of heparin. Will obtain a CTA to rule out acute PE. She does have a allergy to  contrast but it is only itching. Patient and family deny a history of hives, throat or lip swelling, or anaphylaxis. She will be pretreated with Benadryl and steroids. Patient started on heparin per pharmacy. Case discussed with [facility] she will be admitted for ACS/NSTEMI.   Final Clinical Impressions(s) / ED Diagnoses   Final diagnoses:  None   ED Discharge Orders    None       Ina Homes, MD 03/08/17 1408

## 2017-03-08 NOTE — ED Provider Notes (Signed)
I have personally seen and examined the patient. I have reviewed the documentation on PMH/FH/Soc Hx. I have discussed the plan of care with the resident and patient.  I have reviewed and agree with the resident's documentation. Please see associated encounter note.  Briefly, the patient is a 76 y.o. female here with with a history of pancreatic cancer status post Whipple on November 13, 2016 who presents with recurrent epigastric abdominal pain.  Denies any overt chest pain but does endorse some mild shortness of breath.  On exam patient is afebrile with stable vital signs.  Lungs clear to auscultation bilaterally.  No murmurs rubs or gallops.  Mild epigastric discomfort.  1+ bilateral lower extremity edema.  EKG without acute ischemic changes or evidence of pericarditis.  It did note some evidence of right heart strain.  Initial troponin elevated at 0.25 concerning for ACS versus pulmonary embolism versus heart failure.  Case was discussed with hospitalist who will admit the patient for further workup and management.  Patient will require CTA to rule out pulmonary embolism but has a allergy to contrast requiring premedication.  Hemoccult was negative and patient was started on heparin drip.   EKG Interpretation  Date/Time:  Sunday March 08 2017 12:12:26 EST Ventricular Rate:  65 PR Interval:    QRS Duration: 87 QT Interval:  432 QTC Calculation: 450 R Axis:   69 Text Interpretation:  Sinus rhythm Consider left ventricular hypertrophy Baseline wander in lead(s) V6 evidence of right heart strain with peaked p waves and S1. NO STEMI Otherwise no significant change Confirmed by Addison Lank [phone]) on 03/08/2017 12:27:11 PM Also confirmed by Addison Lank [phone]), editor Laurena Spies [phone])  on 03/08/2017 12:33:23 PM      CRITICAL CARE Performed by: Grayce Sessions Cardama Total critical care time: 30 minutes Critical care time was exclusive of separately billable procedures and treating other  patients. Critical care was necessary to treat or prevent imminent or life-threatening deterioration. Critical care was time spent personally by me on the following activities: development of treatment plan with patient and/or surrogate as well as nursing, discussions with consultants, evaluation of patient's response to treatment, examination of patient, obtaining history from patient or surrogate, ordering and performing treatments and interventions, ordering and review of laboratory studies, ordering and review of radiographic studies, pulse oximetry and re-evaluation of patient's condition.     Fatima Blank, MD 03/08/17 1409

## 2017-03-08 NOTE — Progress Notes (Signed)
ANTICOAGULATION CONSULT NOTE - Initial Consult  Pharmacy Consult:  Heparin Indication: chest pain/ACS  Allergies  Allergen Reactions  . Tarka [Trandolapril-Verapamil Hcl Er] Swelling    Slowed heart rate Pt reports allergy to any cardiac medication ending in "-il"  . Atorvastatin Other (See Comments)    Stated that it lowered her heart rate  . Contrast Media [Iodinated Diagnostic Agents] Itching    Patient Measurements: Weight: 113 lb (51.3 kg) Heparin Dosing Weight: 51 kg  Vital Signs: Temp: 97.7 F (36.5 C) (12/02 1108) Temp Source: Oral (12/02 1108) BP: 143/69 (12/02 1330) Pulse Rate: 68 (12/02 1223)  Labs: Recent Labs    03/08/17 1119  HGB 10.6*  HCT 32.1*  PLT 266  CREATININE 0.75    Estimated Creatinine Clearance: 46.9 mL/min (by C-G formula based on SCr of 0.75 mg/dL).   Medical History: Past Medical History:  Diagnosis Date  . AI (aortic insufficiency)   . Anemia   . History of nuclear stress test 11/2008   bruce myoview; normal pattern of perfusion in allregions, no significant wall motion abnormalities; no ECG changes, no significant ischemia demonstrated, low risk scan   . Hypertension   . MR (mitral regurgitation)   . Rheumatic fever   . Rheumatic heart disease   . Rheumatoid arthritis ([place])   . Sinus bradycardia       Assessment: 24 YOF presented with chest pain and Pharmacy consulted to initiate IV heparin for ACS.  Troponin elevated.  Patient has significant weight loss since the diagnosis of pancreatic cancer.  She denies being on blood thinners at home.  Baseline labs reviewed.  Goal of Therapy:  Heparin level 0.3-0.7 units/ml Monitor platelets by anticoagulation protocol: Yes    Plan:  D/C heparin SQ Heparin 2000 units IV bolus x 1, then Heparin gtt at 600 units/hr Check 8 hr heparin level Daily heparin level and CBC   Kamylle Axelson D. Mina Marble, PharmD, BCPS Pager:  [phone] 03/08/2017, 2:20 PM

## 2017-03-08 NOTE — ED Notes (Signed)
Unsuccessful attempt to start IV x 2.  

## 2017-03-08 NOTE — ED Notes (Signed)
F/u with pharmacy regarding meds, sending now.

## 2017-03-08 NOTE — ED Triage Notes (Addendum)
Pt reports mid sternal cp which started 3 days ago.  Chest pain radiates "down" to her stomach, denies radiation to her arm and neck.  Reports having a whipple surgery in August by doctor Barry Dienes.  She also reports visual problem this am, states she was not able to read the numbers on her phone.  She also reports feeling confused this am with weakness, she also has not eaten for 3 days and has only been drinking ensure.  Pt is A&Ox 4.  Family is at bedside.

## 2017-03-08 NOTE — ED Notes (Signed)
Pt placed on hospital bed

## 2017-03-08 NOTE — ED Notes (Signed)
Heparin switched to 24G IV while pt has contrast injected into 20G RAC IV. Will replace heparin in 20G IV on return

## 2017-03-08 NOTE — ED Notes (Signed)
Patient transported to CT 

## 2017-03-08 NOTE — ED Notes (Signed)
[employer] called to fix O2 sat monitor. Will check pt O2 sat using [employer]om[employer]

## 2017-03-08 NOTE — ED Notes (Signed)
EDP at the bedside.  ?

## 2017-03-08 NOTE — ED Notes (Signed)
F/u with CT, pt to be transported next.

## 2017-03-08 NOTE — ED Notes (Signed)
Per pharmacy, tubing solu-cortef at this time

## 2017-03-08 NOTE — ED Notes (Signed)
Pt reports she does not have to void at this time

## 2017-03-08 NOTE — ED Notes (Signed)
[place], RN attempting to start appropriate IV in pt

## 2017-03-08 NOTE — ED Notes (Signed)
Admitting at the bedside.  

## 2017-03-08 NOTE — ED Notes (Signed)
Pt reports nonradiating midsternal CP x 3 days accompanied by acute on chronic epigastric pain and lower abd pain consistent with a previous Whipple procedure. Pt reports she always has 10/10 stabbing pain that is only relieved with her recently prescribed oxycodone. Pt also reports generalized weakness, SOB on exertion, and loss of appetite. Pt states she has only eaten a small amount of food in the last 3 days. Pt also states she had an episode of blurred vision yesterday morning that is intermittent since. Pt A&Ox4. Answers questions appropriately.

## 2017-03-08 NOTE — ED Provider Notes (Signed)
Dr. Hal Hope made aware of patient's CT result and that the patient had already been started on ACS protocol heparin.   Charlesetta Shanks, MD 03/08/17 2103

## 2017-03-08 NOTE — H&P (Signed)
History and Physical    Rhonda Spencer XTK:[id number] DOB: [birth year] DOA: 03/08/2017   PCP: Iona Beard, MD   Patient coming from:  Home    Chief Complaint: Chest pain   HPI: Rhonda Spencer is a 76 y.o. 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  She reports the pain to be 10 out of 10, stabbing, only relieved by oxycodone.  Pain is isolated, does not radiate to the back or shoulder or neck.  She also reports generalized weakness, failure to thrive, in intentional weight loss more than 40 pounds since August of this year, She reports shortness of breath at rest and on exertion without hemoptysis or cough.  Of note, the patient reports that every time that her blood pressure rises, she has intermittent blurred vision.  She denies any confusion.  No history of falls.  No syncope or presyncope.  She denies any lower abdominal pain, dysuria, or hematuria.  She has intermittent chronic diarrhea, she is on Creon.  She is compliant with her medications.  She takes an aspirin a day.  She denies any history of MI but she did have rheumatic fever as a child, and has a "leaky valve".  Patient denies any history of CVAs. She denies any recent infections over the last month.  However, she did have an hospitalization for sepsis in December 25, 2016 due to H CAP and UTI, MRSA bacteremia due to peak Line treated with vancomycin  ED Course:  BP 129/75   Pulse 68   Temp 97.7 F (36.5 C) (Oral)   Resp 17   SpO2 99%   EKG shows no ST elevation, No LBBB, there is T wave inversion in leads V1 and III, and first degree heart block.    Initial troponin 0.25. She was given nitroglycerin without significant reliefHeart Score of 5.  Chest x-ray NAD Troponin  0.25 Glucose 115 Creatinine 0.75 White count 7.8, hemoglobin 10.6, platelets 260 6K,  Last 2D echo in September 2018 showed EF 99-24%, grade 1 diastolic, possible aortic vegetation, mild to moderate AI, mild TR.  TEE was deferred at the  time, and instead was treated with vancomycin for 2 weeks as per infectious diseases Sinus rhythm ,Baseline wander in lead(s) V6, evidence of right heart strain with peaked p waves and S1. NO STEMI  Review of Systems:  As per HPI otherwise all other systems reviewed and are negative  Past Medical History:  Diagnosis Date  . AI (aortic insufficiency)   . Anemia   . History of nuclear stress test 11/2008   bruce myoview; normal pattern of perfusion in allregions, no significant wall motion abnormalities; no ECG changes, no significant ischemia demonstrated, low risk scan   . Hypertension   . MR (mitral regurgitation)   . Rheumatic fever   . Rheumatic heart disease   . Rheumatoid arthritis ([place])   . Sinus bradycardia     Past Surgical History:  Procedure Laterality Date  . ABDOMINAL HYSTERECTOMY    . ABDOMINAL SURGERY  11/2016   whipple  . CHOLECYSTECTOMY    . COLON SURGERY    . ERCP N/A 10/03/2016   Procedure: ENDOSCOPIC RETROGRADE  CHOLANGIOPANCREATOGRAPHY (ERCP);  Surgeon: Carol Ada, MD;  Location: [facility];  Service: Endoscopy;  Laterality: N/A;  . EXCISIONAL HEMORRHOIDECTOMY    . HEMI-MICRODISCECTOMY LUMBAR LAMINECTOMY LEVEL 4  1995  . JEJUNOSTOMY N/A 11/13/2016   Procedure: PLACEMENT JEJUNOSTOMY TUBE;  Surgeon: [name], MD;  Location: [facility];  Service: General;  Laterality: N/A;  . LAPAROSCOPY N/A 11/13/2016   Procedure: ATTEMPTED DAGNOSTIC LAPAROSCOPY;  Surgeon: [name], MD;  Location: [facility];  Service: General;  Laterality: N/A;  . TONSILLECTOMY    . TRANSTHORACIC ECHOCARDIOGRAM  11/2011   EF=>55%; LA  mild-mod dilated; mod-severe MR; mild-mod TR with normal systolic pressure; mild-mod AV regurg; mild pulm valve regurg   . UPPER ESOPHAGEAL ENDOSCOPIC ULTRASOUND (EUS) N/A 09/26/2016   Procedure: UPPER ESOPHAGEAL ENDOSCOPIC ULTRASOUND (EUS);  Surgeon: Carol Ada, MD;  Location: [facility];  Service: Endoscopy;  Laterality: N/A;  . UPPER ESOPHAGEAL ENDOSCOPIC ULTRASOUND (EUS) N/A 10/03/2016   Procedure: UPPER ESOPHAGEAL ENDOSCOPIC ULTRASOUND (EUS);  Surgeon: Carol Ada, MD;  Location: [facility];  Service: Endoscopy;  Laterality: N/A;  . WHIPPLE PROCEDURE N/A 11/13/2016   Procedure: WHIPPLE PROCEDURE;  Surgeon: [name], MD;  Location: [facility];  Service: General;  Laterality: N/A;    Social History Social History   Socioeconomic History  . Marital status: Single    Spouse name: Not on file  . Number of children: 2  . Years of education: 76  . Highest education level: Not on file  Social Needs  . Financial resource strain: Not on file  . Food insecurity - worry: Not on file  . Food insecurity - inability: Not on file  . Transportation needs - medical: Not on file  . Transportation needs - non-medical: Not on file  Occupational History  . Occupation: Retired  Tobacco Use  . Smoking status: Former Smoker    Years: 8.00    Last attempt to quit: 04/08/1967    Years since quitting: 49.9  . Smokeless tobacco: Never Used  Substance and Sexual Activity  . Alcohol use: No  . Drug use: No  . Sexual activity: No  Other Topics Concern  . Not on file  Social History Narrative   She lives in an apartment in [place]. She has children, grandchildren and great grandchildren in the area.     Allergies  Allergen Reactions  . Tarka [Trandolapril-Verapamil Hcl Er] Swelling    Slowed heart rate Pt reports allergy to any cardiac medication ending in "-il"  . Atorvastatin Other (See Comments)    Stated that it lowered her heart rate  . Contrast Media [Iodinated Diagnostic Agents]  Itching    Family History  Problem Relation Age of Onset  . Hypertension Mother   . Stroke Mother   . Heart disease Mother   . Pneumonia Father   . Bone cancer Maternal Uncle   . Diabetes Sister        x2; one was step-sister  . Tuberculosis Sister        step-sister  . Diabetes Son   . Diabetes Daughter       Prior to Admission medications   Medication Sig Start Date End Date Taking? Authorizing Provider  acetaminophen (TYLENOL) 325 MG tablet Take 2 tablets (650 mg total) by mouth every 4 (four) hours as needed for mild pain, moderate pain, fever or headache. Patient taking differently: Take 650 mg by mouth every 4 (four) hours as needed (for elevated temperature or pain).  12/04/16   [name], MD  aspirin EC 81 MG tablet Take 81 mg by mouth daily.    [provider]  felodipine (PLENDIL) 5 MG 24 hr tablet Take 5 mg by mouth daily.    [provider]    Physical Exam:  Vitals:   03/08/17 1223 03/08/17 1249 03/08/17 1300 03/08/17 1315  BP: (!) 152/72 121/76 107/65 129/75  Pulse: 68     Resp: 20 19 16 17   Temp:      TempSrc:      SpO2: 99%      Constitutional: NAD, calm, ill appearing, cachectic  Eyes: PERRL, lids and conjunctivae normal. Sclera opaque  ENMT: Mucous membranes are moist, without exudate or lesions  Neck: normal, supple, no masses, no thyromegaly Respiratory: clear to auscultation bilaterally, no wheezing, no crackles. Normal respiratory effort  Cardiovascular: Regular rate and rhythm, 2/6 murmur, rubs or gallops. No extremity edema. 2+ pedal pulses. No carotid bruits.  Abdomen: Soft, tender to palpation , No hepatosplenomegaly. Bowel sounds positive.  Musculoskeletal: no clubbing / cyanosis. Moves all extremities Skin: no jaundice, No lesions.  Neurologic: Sensation intact  Strength equal in all extremities Psychiatric:   Alert and oriented x 3. Normal mood.     Labs on Admission: I have personally reviewed following labs and  imaging studies  CBC: Recent Labs  Lab 03/08/17 1119  WBC 7.8  HGB 10.6*  HCT 32.1*  MCV 87.0  PLT 295    Basic Metabolic Panel: Recent Labs  Lab 03/08/17 1119  NA 136  K 3.7  CL 102  CO2 24  GLUCOSE 115*  BUN 11  CREATININE 0.75  CALCIUM 8.6*    GFR: CrCl cannot be calculated (Unknown ideal weight.).  Liver Function Tests: No results for input(s): AST, ALT, ALKPHOS, BILITOT, PROT, ALBUMIN in the last 168 hours. No results for input(s): LIPASE, AMYLASE in the last 168 hours. No results for input(s): AMMONIA in the last 168 hours.  Coagulation Profile: No results for input(s): INR, PROTIME in the last 168 hours.  Cardiac Enzymes: No results for input(s): CKTOTAL, CKMB, CKMBINDEX, TROPONINI in the last 168 hours.  BNP (last 3 results) No results for input(s): PROBNP in the last 8760 hours.  HbA1C: No results for input(s): HGBA1C in the last 72 hours.  CBG: No results for input(s): GLUCAP in the last 168 hours.  Lipid Profile: No results for input(s): CHOL, HDL, LDLCALC, TRIG, CHOLHDL, LDLDIRECT in the last 72 hours.  Thyroid Function Tests: No results for input(s): TSH, T4TOTAL, FREET4, T3FREE, THYROIDAB in the last 72 hours.  Anemia Panel: No results for input(s): VITAMINB12, FOLATE, FERRITIN, TIBC, IRON, RETICCTPCT in the last 72 hours.  Urine analysis:    Component Value Date/Time   COLORURINE YELLOW 12/17/2016 2345   APPEARANCEUR HAZY (A) 12/17/2016 2345   LABSPEC 1.019 12/17/2016 2345   PHURINE 6.0 12/17/2016 2345   GLUCOSEU NEGATIVE 12/17/2016 2345   HGBUR NEGATIVE 12/17/2016 2345   BILIRUBINUR NEGATIVE 12/17/2016 2345   KETONESUR NEGATIVE 12/17/2016 2345   PROTEINUR 30 (A) 12/17/2016 2345   UROBILINOGEN 1.0 07/31/2013 0155   NITRITE POSITIVE (A) 12/17/2016 2345   LEUKOCYTESUR TRACE (A) 12/17/2016 2345    Sepsis Labs: @LABRCNTIP (procalcitonin:4,lacticidven:4) )No results found for this or any previous visit (from the past 240 hour(s)).    Radiological Exams on Admission: Dg Chest 2 View  Result Date: 03/08/2017 CLINICAL DATA:  Chest pain for the past 3 days. EXAM: CHEST  2 VIEW COMPARISON:  Chest x-ray dated December 17, 2016. FINDINGS: Interval removal  of the right PICC catheter. The cardiomediastinal silhouette is normal in size. Normal pulmonary vascularity. No focal consolidation, pleural effusion, or pneumothorax. No acute osseous abnormality. IMPRESSION: No active cardiopulmonary disease. Electronically Signed   By: Titus Dubin M.D.   On: 03/08/2017 12:00    EKG: Independently reviewed.  Assessment/Plan Active Problems:   Chest pain   AI (aortic insufficiency)   Mitral regurgitation   Rheumatic heart disease   HTN (hypertension)   GERD (gastroesophageal reflux disease)   Essential hypertension   Chronic pain   Rheumatoid arthritis (HCC)   Tricuspid regurgitation   Pancreatic cancer (HCC)   Adenocarcinoma of head of pancreas (HCC)   DM type 2 (diabetes mellitus, type 2) (HCC)   Protein-calorie malnutrition, severe   Multiple lung nodules    Chest pain syndrome, cardiac versus  HEART score 8-9   . Troponin 0.25 , EKG Sinus rhythm ,Baseline wander in lead(s) V6, evidence of right heart strain with peaked p waves and S1. NO STEMI  CP unrelieved by nitroglycerin, CXR unrevealing.Last 2D echo in September 2018 showed EF 22-97%, grade 1 diastolic, possible aortic vegetation, mild to moderate AI, mild TR. Of note, last CT chest shouwed pulmonary nodules as well   Admit to SDU Chest pain order set Cycle troponins EKG in am continue ASA, O2 and NTG as needed GI cocktail Awaiting CT angio report, which was ordered due to right heart strain. Pending on the results of the CT angiogram may need cardiology evaluation   Hypertension BP 143/69   Pulse 68 She is not on any blood pressure medications.   Will give her hydralazine PRN for blood pressure of 160/90  GERD,  PPI and G I cocktail    Type II  Diabetes Current blood sugar level is 115  Lab Results  Component Value Date   HGBA1C 7.1 (H) 12/23/2016  Hgb A1C SSI  Epigastric pain, intractable in the setting of a history of pancreatic cancer, adenocarcinoma, stage III, T3N2 N0, as of August 2018, status post Whipple procedure in 2018, not yet on chemotherapy or radiation, supposed to see Dr. Annamaria Boots next week  Patient has now failure to thrive as well.  A CT of the abdomen and pelvis was ordered by EDP, with results currently pending Will await for those results, if progression of disease is noted,will obtain Oncology consult  Generous pain control with oxycodne, morphine  Add Oxycontin 10 mg bid to raise the threshold of pain in view of malignancy  For her severe protein calorie malnutrition, will obtain nutrition consultation, the patient has been losing significant amount of weight, 40 pounds since the diagnosis of pancreatic cancer. Albumin is 2.6 Check prealbumin    DVT prophylaxis:  Heparin per Pharmacy   Code Status:    Full  Family Communication:  Discussed with patient Disposition Plan: Expect patient to be discharged to home after condition improves Consults called:    None for now  Admission status:  SDU    Sharene Butters, PA-C [facility]   03/08/2017, 1:42 PM

## 2017-03-08 NOTE — ED Notes (Signed)
I stat Trop I results shown to Dr. Tarri Abernethy

## 2017-03-08 NOTE — ED Notes (Signed)
Heparin running through 20G RAC IV site

## 2017-03-08 NOTE — ED Notes (Signed)
Hospital bed ordered.

## 2017-03-08 NOTE — ED Notes (Signed)
Pt transported to XR.  

## 2017-03-08 NOTE — ED Notes (Signed)
Pt reports her change in vision occurred yesterday morning. Family reports pt has been more weak x 2-3 days.

## 2017-03-08 NOTE — ED Notes (Signed)
Per CT, risk of pt having more severe reaction to contrast second time around. Per CT, spoke with Dr. Leonette Monarch and ordering provider to order VQ scan. Will confirm with Dr. Leonette Monarch.

## 2017-03-08 NOTE — ED Notes (Signed)
Normal saline and heparin still infusing

## 2017-03-09 ENCOUNTER — Inpatient Hospital Stay (HOSPITAL_COMMUNITY): Payer: Medicare Other

## 2017-03-09 ENCOUNTER — Other Ambulatory Visit: Payer: Self-pay

## 2017-03-09 DIAGNOSIS — R1013 Epigastric pain: Secondary | ICD-10-CM

## 2017-03-09 DIAGNOSIS — I361 Nonrheumatic tricuspid (valve) insufficiency: Secondary | ICD-10-CM

## 2017-03-09 LAB — COMPREHENSIVE METABOLIC PANEL
ALBUMIN: 2.2 g/dL — AB (ref 3.5–5.0)
ALT: 27 U/L (ref 14–54)
AST: 62 U/L — ABNORMAL HIGH (ref 15–41)
Alkaline Phosphatase: 382 U/L — ABNORMAL HIGH (ref 38–126)
Anion gap: 8 (ref 5–15)
BUN: 15 mg/dL (ref 6–20)
CALCIUM: 8.1 mg/dL — AB (ref 8.9–10.3)
CO2: 24 mmol/L (ref 22–32)
Chloride: 105 mmol/L (ref 101–111)
Creatinine, Ser: 0.82 mg/dL (ref 0.44–1.00)
GFR calc non Af Amer: >60 mL/min (ref >60)
GLUCOSE: 127 mg/dL — AB (ref 65–99)
POTASSIUM: 3.8 mmol/L (ref 3.5–5.1)
SODIUM: 137 mmol/L (ref 135–145)
TOTAL PROTEIN: 6.1 g/dL — AB (ref 6.5–8.1)
Total Bilirubin: 1.3 mg/dL — ABNORMAL HIGH (ref 0.3–1.2)

## 2017-03-09 LAB — GLUCOSE, CAPILLARY
GLUCOSE-CAPILLARY: 152 mg/dL — AB (ref 65–99)
Glucose-Capillary: 121 mg/dL — ABNORMAL HIGH (ref 65–99)
Glucose-Capillary: 238 mg/dL — ABNORMAL HIGH (ref 65–99)

## 2017-03-09 LAB — ECHOCARDIOGRAM COMPLETE
HEIGHTINCHES: 65 in
WEIGHTICAEL: 1686.08 [oz_av]

## 2017-03-09 LAB — HEMOGLOBIN A1C
Hgb A1c MFr Bld: 5.7 % — ABNORMAL HIGH (ref 4.8–5.6)
Mean Plasma Glucose: 117 mg/dL

## 2017-03-09 LAB — URINALYSIS, ROUTINE W REFLEX MICROSCOPIC
Bilirubin Urine: NEGATIVE
GLUCOSE, UA: NEGATIVE mg/dL
Hgb urine dipstick: NEGATIVE
Ketones, ur: 20 mg/dL — AB
LEUKOCYTES UA: NEGATIVE
NITRITE: NEGATIVE
PH: 6 (ref 5.0–8.0)
Protein, ur: NEGATIVE mg/dL

## 2017-03-09 LAB — CBC
HEMATOCRIT: 28 % — AB (ref 36.0–46.0)
HEMOGLOBIN: 9.2 g/dL — AB (ref 12.0–15.0)
MCH: 28.6 pg (ref 26.0–34.0)
MCHC: 32.9 g/dL (ref 30.0–36.0)
MCV: 87 fL (ref 78.0–100.0)
Platelets: 263 10*3/uL (ref 150–400)
RBC: 3.22 MIL/uL — AB (ref 3.87–5.11)
RDW: 16.6 % — ABNORMAL HIGH (ref 11.5–15.5)
WBC: 8.1 10*3/uL (ref 4.0–10.5)

## 2017-03-09 LAB — MRSA PCR SCREENING: MRSA BY PCR: NEGATIVE

## 2017-03-09 LAB — TROPONIN I: Troponin I: 0.2 ng/mL (ref <0.03)

## 2017-03-09 LAB — HEPARIN LEVEL (UNFRACTIONATED)
Heparin Unfractionated: 0.24 IU/mL — ABNORMAL LOW (ref 0.30–0.70)
Heparin Unfractionated: 0.24 IU/mL — ABNORMAL LOW (ref 0.30–0.70)

## 2017-03-09 LAB — PROTIME-INR
INR: 1.44
PROTHROMBIN TIME: 17.4 s — AB (ref 11.4–15.2)

## 2017-03-09 MED ORDER — APIXABAN 5 MG PO TABS: 5.0000 mg | ORAL_TABLET | Freq: Two times a day (BID) | ORAL | Status: DC

## 2017-03-09 MED ORDER — ENSURE ENLIVE PO LIQD
237.0000 mL | Freq: Three times a day (TID) | ORAL | Status: DC
  Administered 2017-03-10 – 2017-03-13 (×7): 237 mL via ORAL

## 2017-03-09 MED ORDER — APIXABAN 5 MG PO TABS
10.0000 mg | ORAL_TABLET | Freq: Two times a day (BID) | ORAL | Status: DC
  Administered 2017-03-09 – 2017-03-13 (×9): 10 mg via ORAL
  Filled 2017-03-09: qty 4
  Filled 2017-03-09 (×8): qty 2

## 2017-03-09 MED ORDER — ADULT MULTIVITAMIN W/MINERALS CH
1.0000 | ORAL_TABLET | Freq: Every day | ORAL | Status: DC
  Administered 2017-03-09 – 2017-03-13 (×5): 1 via ORAL
  Filled 2017-03-09 (×5): qty 1

## 2017-03-09 NOTE — Progress Notes (Signed)
  Echocardiogram 2D Echocardiogram has been performed.  Krysti Hickling T Danylah Holden 03/09/2017, 11:01 AM

## 2017-03-09 NOTE — Progress Notes (Addendum)
ANTICOAGULATION CONSULT NOTE  Pharmacy Consult:  Heparin Indication: chest pain/ACS  Allergies  Allergen Reactions  . Tarka [Trandolapril-Verapamil Hcl Er] Swelling    Slowed heart rate Pt reports allergy to any cardiac medication ending in "-il"  . Atorvastatin Other (See Comments)    Stated that it lowered her heart rate  . Contrast Media [Iodinated Diagnostic Agents] Itching    Patient Measurements: Height: 5\' 5"  (165.1 cm) Weight: 105 lb 6.1 oz (47.8 kg) IBW/kg (Calculated) : 57 Heparin Dosing Weight: 47.8kg  Vital Signs: Temp: 99.1 F (37.3 C) (12/03 0840) Temp Source: Oral (12/03 0840) BP: 178/65 (12/03 0840) Pulse Rate: 81 (12/03 0700)  Labs: Recent Labs    03/08/17 1119 03/08/17 1338 03/08/17 1920 03/09/17 0008 03/09/17 0359 03/09/17 1047  HGB 10.6*  --   --   --  9.2*  --   HCT 32.1*  --   --   --  28.0*  --   PLT 266  --   --   --  263  --   LABPROT  --   --   --   --  17.4*  --   INR  --   --   --   --  1.44  --   HEPARINUNFRC  --   --   --  0.24*  --  0.24*  CREATININE 0.75  --   --   --  0.82  --   TROPONINI  --  0.21* 0.20* 0.20*  --   --     Estimated Creatinine Clearance: 42.7 mL/min (by C-G formula based on SCr of 0.82 mg/dL).  Assessment: 55 YOF presented with chest pain continues on IV heparin. Heparin level remains subtherapeutic at 0.24. No bleeding noted.   Goal of Therapy:  Heparin level 0.3-0.7 units/ml Monitor platelets by anticoagulation protocol: Yes   Plan:  Increase heparin gtt to 850 units/hr Check an 8 hr heparin level Daily heparin level and CBC  Addendum: Changing heparin to apixaban for PE. Will start apixaban 10mg  PO BID x 7 days then 5mg  PO BID  Salome Arnt, PharmD, BCPS Phone #: [phone] until 3:30pm All other times, call [place] x [phone] 03/09/2017 11:41 AM

## 2017-03-09 NOTE — Discharge Instructions (Signed)
Information on my medicine - ELIQUIS (apixaban)  This medication education was reviewed with me or my healthcare representative as part of my discharge preparation.   Why was Eliquis prescribed for you? Eliquis was prescribed to treat blood clots that may have been found in the veins of your legs (deep vein thrombosis) or in your lungs (pulmonary embolism) and to reduce the risk of them occurring again.  What do You need to know about Eliquis ? The starting dose is 10 mg (two 5 mg tablets) taken TWICE daily for the FIRST SEVEN (7) DAYS, then on (enter date)  12/10  the dose is reduced to ONE 5 mg tablet taken TWICE daily.  Eliquis may be taken with or without food.   Try to take the dose about the same time in the morning and in the evening. If you have difficulty swallowing the tablet whole please discuss with your pharmacist how to take the medication safely.  Take Eliquis exactly as prescribed and DO NOT stop taking Eliquis without talking to the doctor who prescribed the medication.  Stopping may increase your risk of developing a new blood clot.  Refill your prescription before you run out.  After discharge, you should have regular check-up appointments with your healthcare provider that is prescribing your Eliquis.    What do you do if you miss a dose? If a dose of ELIQUIS is not taken at the scheduled time, take it as soon as possible on the same day and twice-daily administration should be resumed. The dose should not be doubled to make up for a missed dose.  Important Safety Information A possible side effect of Eliquis is bleeding. You should call your healthcare provider right away if you experience any of the following: ? Bleeding from an injury or your nose that does not stop. ? Unusual colored urine (red or dark brown) or unusual colored stools (red or black). ? Unusual bruising for unknown reasons. ? A serious fall or if you hit your head (even if there is no  bleeding).  Some medicines may interact with Eliquis and might increase your risk of bleeding or clotting while on Eliquis. To help avoid this, consult your healthcare provider or pharmacist prior to using any new prescription or non-prescription medications, including herbals, vitamins, non-steroidal anti-inflammatory drugs (NSAIDs) and supplements.  This website has more information on Eliquis (apixaban): http://www.eliquis.com/eliquis/home

## 2017-03-09 NOTE — Progress Notes (Signed)
Initial Nutrition Assessment  DOCUMENTATION CODES:   Severe malnutrition in context of chronic illness, Underweight  INTERVENTION:  Provide Ensure Enlive po TID, each supplement provides 350 kcal and 20 grams of protein.  Provide MVI once daily.   Encourage adequate PO intake.   NUTRITION DIAGNOSIS:   Severe Malnutrition related to cancer and cancer related treatments as evidenced by percent weight loss, severe fat depletion, severe muscle depletion.  GOAL:   Patient will meet greater than or equal to 90% of their needs  MONITOR:   PO intake, Supplement acceptance, Labs, Weight trends, I & O's, Skin  REASON FOR ASSESSMENT:   Consult Assessment of nutrition requirement/status  ASSESSMENT:   76 y.o. 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   Pt reports appetite has been improving and she is feeling better today. Meal completion has been 75-90%. Pt reports poor po intake PTA especially the 3 days prior with 0% intake due to severe abdominal pains. Pt does reports taste changes and poor po intake since whipple procedure. Pt reports she has only been consuming 2 Ensure shakes a day as other foods taste metallic to her. RD to order Ensure while pt is currently admitted to aid in adequate nutrition. Pt educated to increase Ensure to at least 3 bottles a day and to start a MVI when discharged. Usual body weight reported to be ~143 lbs which she last reported weighing back in the summer time 2018. Per weight records, pt with a 21% weight loss in 6 months. Pt additionally encouraged to eat her food at meals  with at least 3 meals a day. Pt reports understanding.   Labs and medications reviewed.  NUTRITION - FOCUSED PHYSICAL EXAM:    Most Recent Value  Orbital Region  Moderate depletion  Upper Arm Region  Moderate depletion  Thoracic and Lumbar Region  Severe depletion  Buccal Region  Severe depletion  Temple Region  Unable to assess  Clavicle Bone Region  Severe depletion  Clavicle and Acromion Bone Region  Severe depletion  Scapular Bone Region  Unable to assess  Dorsal Hand  Unable to assess  Patellar Region  Mild depletion  Anterior Thigh Region  Moderate depletion  Posterior Calf Region  Mild depletion  Edema (RD Assessment)  None  Hair  Reviewed  Eyes  Reviewed  Mouth  Reviewed  Skin  Reviewed  Nails  Reviewed       Diet Order:  Diet regular Room service appropriate? Yes; Fluid consistency: Thin  EDUCATION NEEDS:   Education needs have been addressed  Skin:  Skin Assessment: Reviewed RN Assessment  Last BM:  12/1  Height:   Ht Readings from Last 1 Encounters:  03/09/17 5\' 5"  (1.651 m)    Weight:   Wt Readings from Last 1 Encounters:  03/09/17 105 lb 6.1 oz (47.8 kg)    Ideal Body Weight:  56.8 kg  BMI:  Body mass index is 17.54 kg/m.  Estimated Nutritional Needs:   Kcal:  1700-1850  Protein:  70-85 grams  Fluid:  1.7 - 1.8 L/day    Rhonda Parker, MS, RD, LDN Pager # [phone] After hours/ weekend pager # [phone]

## 2017-03-09 NOTE — Progress Notes (Signed)
Md made aware about the [id number], gave no order.

## 2017-03-09 NOTE — Progress Notes (Signed)
ANTICOAGULATION CONSULT NOTE - Follow Up Consult  Pharmacy Consult for heparin Indication: chest pain/ACS  Labs: Recent Labs    03/08/17 1119 03/08/17 1338 03/08/17 1920 03/09/17 0008  HGB 10.6*  --   --   --   HCT 32.1*  --   --   --   PLT 266  --   --   --   HEPARINUNFRC  --   --   --  0.24*  CREATININE 0.75  --   --   --   TROPONINI  --  0.21* 0.20*  --     Assessment: 76yo female subtherapeutic on heparin with initial dosing for CP.  Goal of Therapy:  Heparin level 0.3-0.7 units/ml   Plan:  Will increase heparin gtt by 2 units/kg/hr to 700 units/hr and check level in [place], PharmD, BCPS  03/09/2017,12:52 AM

## 2017-03-09 NOTE — Progress Notes (Signed)
PROGRESS NOTE    Rhonda Spencer   XBJ:[id number]  DOB: May 30, 1940  DOA: 03/08/2017 PCP: Iona Beard, MD   Brief Narrative:  Rhonda Spencer is a 76 y.o. 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     Subjective: Pain has resolved since receiving something in her IV and has not recurred.  ROS: no complaints of nausea, vomiting, constipation diarrhea, cough, dyspnea or dysuria. No other complaints.   Assessment & Plan:   Active Problems: PE - found to have multiple PE on CT- on Heparin- will switch to Xarelto - admits to occasional b/l pedal edema at times- no unilateral edema at this time   Pancreatic cancer with liver mets - s/p Whipples procedure - will f/u with Dr Annamaria Boots on 12/5 - cont Creon  Epigastric pain - suspect GI related- it has resolved for now after receiving medications in the ER- cont PRN pain meds- follow  GERD - cont PPI  Underweight Body mass index is 17.54 kg/m.     DVT prophylaxis: Eliquis Code Status: Full code Family Communication:  Disposition Plan: home when stable Consultants:    Procedures:    Antimicrobials:  Anti-infectives (From admission, onward)   None       Objective: Vitals:   03/09/17 0900 03/09/17 0907 03/09/17 1153 03/09/17 1517  BP:      Pulse:      Resp:      Temp:   98.5 F (36.9 C) 97.9 F (36.6 C)  TempSrc:   Oral Oral  SpO2: 98%     Weight:  47.8 kg (105 lb 6.1 oz)    Height:  5\' 5"  (1.651 m)      Intake/Output Summary (Last 24 hours) at 03/09/2017 1820 Last data filed at 03/09/2017 1730 Gross per 24 hour  Intake 794.5 ml  Output 200 ml  Net 594.5 ml   Filed Weights   03/08/17 1400  03/08/17 1413 03/09/17 0907  Weight: 51.3 kg (113 lb) 51.3 kg (113 lb) 47.8 kg (105 lb 6.1 oz)    Examination: General exam: Appears comfortable  HEENT: PERRLA, oral mucosa moist, no sclera icterus or thrush Respiratory system: Clear to auscultation. Respiratory effort normal. Cardiovascular system: S1 & S2 heard, RRR.  No murmurs  Gastrointestinal system: Abdomen soft, epigastric tenderness, nondistended. Normal bowel sound. No organomegaly Central nervous system: Alert and oriented. No focal neurological deficits. Extremities: No cyanosis, clubbing or edema Skin: No rashes or ulcers Psychiatry:  Mood & affect appropriate.     Data Reviewed: I have personally reviewed following labs and imaging studies  CBC: Recent Labs  Lab 03/08/17 1119 03/09/17 0359  WBC 7.8 8.1  HGB 10.6* 9.2*  HCT 32.1* 28.0*  MCV 87.0 87.0  PLT 266 308   Basic Metabolic Panel: Recent Labs  Lab 03/08/17 1119 03/09/17 0359  NA 136 137  K 3.7 3.8  CL 102 105  CO2 24 24  GLUCOSE 115* 127*  BUN 11 15  CREATININE 0.75 0.82  CALCIUM 8.6* 8.1*   GFR: Estimated Creatinine Clearance: 42.7 mL/min (by C-G formula based on SCr of 0.82 mg/dL). Liver Function Tests: Recent Labs  Lab 03/09/17 0359  AST 62*  ALT 27  ALKPHOS  382*  BILITOT 1.3*  PROT 6.1*  ALBUMIN 2.2*   No results for input(s): LIPASE, AMYLASE in the last 168 hours. No results for input(s): AMMONIA in the last 168 hours. Coagulation Profile: Recent Labs  Lab 03/09/17 0359  INR 1.44   Cardiac Enzymes: Recent Labs  Lab 03/08/17 1338 03/08/17 1920 03/09/17 0008  TROPONINI 0.21* 0.20* 0.20*   BNP (last 3 results) No results for input(s): PROBNP in the last 8760 hours. HbA1C: Recent Labs    03/08/17 1338  HGBA1C 5.7*   CBG: Recent Labs  Lab 03/08/17 1632 03/08/17 2232 03/09/17 0923 03/09/17 1153 03/09/17 1713  GLUCAP 104* 118* 121* 152* 238*   Lipid Profile: No results for input(s): CHOL, HDL, LDLCALC, TRIG,  CHOLHDL, LDLDIRECT in the last 72 hours. Thyroid Function Tests: No results for input(s): TSH, T4TOTAL, FREET4, T3FREE, THYROIDAB in the last 72 hours. Anemia Panel: No results for input(s): VITAMINB12, FOLATE, FERRITIN, TIBC, IRON, RETICCTPCT in the last 72 hours. Urine analysis:    Component Value Date/Time   COLORURINE YELLOW 03/09/2017 0004   APPEARANCEUR CLEAR 03/09/2017 0004   LABSPEC >1.046 (H) 03/09/2017 0004   PHURINE 6.0 03/09/2017 0004   GLUCOSEU NEGATIVE 03/09/2017 0004   HGBUR NEGATIVE 03/09/2017 0004   BILIRUBINUR NEGATIVE 03/09/2017 0004   KETONESUR 20 (A) 03/09/2017 0004   PROTEINUR NEGATIVE 03/09/2017 0004   UROBILINOGEN 1.0 07/31/2013 0155   NITRITE NEGATIVE 03/09/2017 0004   LEUKOCYTESUR NEGATIVE 03/09/2017 0004   Sepsis Labs: @LABRCNTIP (procalcitonin:4,lacticidven:4) ) Recent Results (from the past 240 hour(s))  MRSA PCR Screening     Status: None   Collection Time: 03/09/17  8:44 AM  Result Value Ref Range Status   MRSA by PCR NEGATIVE NEGATIVE Final    Comment:        The GeneXpert MRSA Assay (FDA approved for NASAL specimens only), is one component of a comprehensive MRSA colonization surveillance program. It is not intended to diagnose MRSA infection nor to guide or monitor treatment for MRSA infections.          Radiology Studies: Dg Chest 2 View  Result Date: 03/08/2017 CLINICAL DATA:  Chest pain for the past 3 days. EXAM: CHEST  2 VIEW COMPARISON:  Chest x-ray dated December 17, 2016. FINDINGS: Interval removal of the right PICC catheter. The cardiomediastinal silhouette is normal in size. Normal pulmonary vascularity. No focal consolidation, pleural effusion, or pneumothorax. No acute osseous abnormality. IMPRESSION: No active cardiopulmonary disease. Electronically Signed   By: Titus Dubin M.D.   On: 03/08/2017 12:00   Ct Angio Chest Pe W/cm &/or Wo Cm  Result Date: 03/08/2017 CLINICAL DATA:  Abdominal pain and distension. History  of pancreatic cancer post Whipple's procedure in August 2018. EXAM: CT ANGIOGRAPHY CHEST CT ABDOMEN AND PELVIS WITH CONTRAST TECHNIQUE: Multidetector CT imaging of the chest was performed using the standard protocol during bolus administration of intravenous contrast. Multiplanar CT image reconstructions and MIPs were obtained to evaluate the vascular anatomy. Multidetector CT imaging of the abdomen and pelvis was performed using the standard protocol during bolus administration of intravenous contrast. CONTRAST:  167mL ISOVUE-370 IOPAMIDOL (ISOVUE-370) INJECTION 76% COMPARISON:  Body CT 09/24/2016 FINDINGS: CTA CHEST FINDINGS Cardiovascular: Satisfactory opacification of the pulmonary arteries to the segmental level. There are right-sided pulmonary emboli. There are nonocclusive pulmonary emboli within the superior, middle and lower right lobar pulmonary arteries. There is an occlusive pulmonary embolus within a segmental branch in the posterior basilar right lower lobe pulmonary artery. There is an occlusive pulmonary embolus  at the bifurcation of the right medial and lateral middle lobe pulmonary arteries. Mediastinum/Nodes: No enlarged mediastinal, hilar, or axillary lymph nodes. Thyroid gland, trachea, and esophagus demonstrate no significant findings. Lungs/Pleura: Lungs are clear. No pleural effusion or pneumothorax. Musculoskeletal: No chest wall abnormality. No acute or significant osseous findings. Review of the MIP images confirms the above findings. CT ABDOMEN and PELVIS FINDINGS Hepatobiliary: There are innumerable hypoechoic irregular liver masses, replacing most of the liver parenchyma. Previously described liver cysts are stable. There is persistent marked dilation of the extrahepatic common bile duct. Pancreas: Post Whipple's procedure. There is a persistent dilation of the main pancreatic duct in its branches with atrophic remaining pancreatic parenchyma. Spleen: Normal in size without focal  abnormality. Adrenals/Urinary Tract: Elongated cortical areas of hypoattenuation in the right kidney I new from the prior study and may represent areas of hypoperfusion, pyelonephritis or renal masses. 1.6 cm lower pole left renal mass, partially exophytic. Normal adrenal glands. Stomach/Bowel: Post Whipple's procedure. There is no evidence of small-bowel obstruction. Vascular/Lymphatic: Rounded retroperitoneal lymph nodes in the upper abdomen, the largest of which measures 8 mm. Minimal atherosclerotic disease of the aorta. Reproductive: Status post hysterectomy. No adnexal masses. Other: No abdominal wall hernia or abnormality. No abdominopelvic ascites. Musculoskeletal: No definite evidence of osseous metastases. Grade 1 anterolisthesis of L4 on L5, likely due to posterior facet arthropathy. 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 Review of the MIP images confirms the above findings. IMPRESSION: Multiple right-sided pulmonary emboli, including occlusive pulmonary emboli within segmental basilar branch in the right lower lobe pulmonary artery, and the medial and lateral segmental branches of the middle lobe. Nonocclusive pulmonary emboli within the lobar branches of the right upper, middle and lower lobes. Right heart strain with RV/ LV ratio of 1.1. Interval development of extensive hepatic metastatic disease, largely replacing the hepatic parenchyma. Interval development of elongated, somewhat geometric areas of hypoattenuation within the right kidney. These may represent renal masses, areas of pyelonephritis or renal infarcts. Interval development of solid left lower pole renal mass, likely metastatic. Persistent common bile duct and distal pancreatic ductal dilation. Mild upper abdominal retroperitoneal lymphadenopathy. These results were called by telephone at the time of interpretation on 03/08/2017 at 7:24 pm to Dr. Lonie Peak, who verbally acknowledged these results. Electronically Signed   By:  Fidela Salisbury M.D.   On: 03/08/2017 19:29   Ct Abdomen Pelvis W Contrast  Result Date: 03/08/2017 CLINICAL DATA:  Abdominal pain and distension. History of pancreatic cancer post Whipple's procedure in August 2018. EXAM: CT ANGIOGRAPHY CHEST CT ABDOMEN AND PELVIS WITH CONTRAST TECHNIQUE: Multidetector CT imaging of the chest was performed using the standard protocol during bolus administration of intravenous contrast. Multiplanar CT image reconstructions and MIPs were obtained to evaluate the vascular anatomy. Multidetector CT imaging of the abdomen and pelvis was performed using the standard protocol during bolus administration of intravenous contrast. CONTRAST:  158mL ISOVUE-370 IOPAMIDOL (ISOVUE-370) INJECTION 76% COMPARISON:  Body CT 09/24/2016 FINDINGS: CTA CHEST FINDINGS Cardiovascular: Satisfactory opacification of the pulmonary arteries to the segmental level. There are right-sided pulmonary emboli. There are nonocclusive pulmonary emboli within the superior, middle and lower right lobar pulmonary arteries. There is an occlusive pulmonary embolus within a segmental branch in the posterior basilar right lower lobe pulmonary artery. There is an occlusive pulmonary embolus at the bifurcation of the right medial and lateral middle lobe pulmonary arteries. Mediastinum/Nodes: No enlarged mediastinal, hilar, or axillary lymph nodes. Thyroid gland, trachea, and esophagus demonstrate no significant findings.  Lungs/Pleura: Lungs are clear. No pleural effusion or pneumothorax. Musculoskeletal: No chest wall abnormality. No acute or significant osseous findings. Review of the MIP images confirms the above findings. CT ABDOMEN and PELVIS FINDINGS Hepatobiliary: There are innumerable hypoechoic irregular liver masses, replacing most of the liver parenchyma. Previously described liver cysts are stable. There is persistent marked dilation of the extrahepatic common bile duct. Pancreas: Post Whipple's procedure.  There is a persistent dilation of the main pancreatic duct in its branches with atrophic remaining pancreatic parenchyma. Spleen: Normal in size without focal abnormality. Adrenals/Urinary Tract: Elongated cortical areas of hypoattenuation in the right kidney I new from the prior study and may represent areas of hypoperfusion, pyelonephritis or renal masses. 1.6 cm lower pole left renal mass, partially exophytic. Normal adrenal glands. Stomach/Bowel: Post Whipple's procedure. There is no evidence of small-bowel obstruction. Vascular/Lymphatic: Rounded retroperitoneal lymph nodes in the upper abdomen, the largest of which measures 8 mm. Minimal atherosclerotic disease of the aorta. Reproductive: Status post hysterectomy. No adnexal masses. Other: No abdominal wall hernia or abnormality. No abdominopelvic ascites. Musculoskeletal: No definite evidence of osseous metastases. Grade 1 anterolisthesis of L4 on L5, likely due to posterior facet arthropathy. 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 Review of the MIP images confirms the above findings. IMPRESSION: Multiple right-sided pulmonary emboli, including occlusive pulmonary emboli within segmental basilar branch in the right lower lobe pulmonary artery, and the medial and lateral segmental branches of the middle lobe. Nonocclusive pulmonary emboli within the lobar branches of the right upper, middle and lower lobes. Right heart strain with RV/ LV ratio of 1.1. Interval development of extensive hepatic metastatic disease, largely replacing the hepatic parenchyma. Interval development of elongated, somewhat geometric areas of hypoattenuation within the right kidney. These may represent renal masses, areas of pyelonephritis or renal infarcts. Interval development of solid left lower pole renal mass, likely metastatic. Persistent common bile duct and distal pancreatic ductal dilation. Mild upper abdominal retroperitoneal lymphadenopathy. These results were  called by telephone at the time of interpretation on 03/08/2017 at 7:24 pm to Dr. Lonie Peak, who verbally acknowledged these results. Electronically Signed   By: Fidela Salisbury M.D.   On: 03/08/2017 19:29      Scheduled Meds: . apixaban  10 mg Oral BID   Followed by  . [START ON 20-Mar-2017] apixaban  5 mg Oral BID  . feeding supplement (ENSURE ENLIVE)  237 mL Oral TID BM  . lipase/protease/amylase  12,000 Units Oral TID  . multivitamin with minerals  1 tablet Oral Daily  . oxyCODONE  10 mg Oral Q12H  . pantoprazole (PROTONIX) IV  40 mg Intravenous Q24H   Continuous Infusions: . sodium chloride 75 mL/hr at 03/08/17 1344     LOS: 1 day    Time spent in minutes: 35    Debbe Odea, MD [facility] Pager: [email or web address] Password [email or web address] 03/09/2017, 6:20 PM

## 2017-03-09 NOTE — ED Notes (Signed)
Per Nicki Reaper, RN pt is NPO at this time. Pt and family notified.

## 2017-03-09 NOTE — ED Notes (Signed)
Pt was able to use bedside toilet, tolerated well.

## 2017-03-10 DIAGNOSIS — K219 Gastro-esophageal reflux disease without esophagitis: Secondary | ICD-10-CM

## 2017-03-10 DIAGNOSIS — R918 Other nonspecific abnormal finding of lung field: Secondary | ICD-10-CM

## 2017-03-10 DIAGNOSIS — I351 Nonrheumatic aortic (valve) insufficiency: Secondary | ICD-10-CM

## 2017-03-10 DIAGNOSIS — I2699 Other pulmonary embolism without acute cor pulmonale: Secondary | ICD-10-CM

## 2017-03-10 DIAGNOSIS — Z794 Long term (current) use of insulin: Secondary | ICD-10-CM

## 2017-03-10 DIAGNOSIS — E118 Type 2 diabetes mellitus with unspecified complications: Secondary | ICD-10-CM

## 2017-03-10 LAB — CBC
HCT: 31.7 % — ABNORMAL LOW (ref 36.0–46.0)
HEMOGLOBIN: 10.4 g/dL — AB (ref 12.0–15.0)
MCH: 28.3 pg (ref 26.0–34.0)
MCHC: 32.8 g/dL (ref 30.0–36.0)
MCV: 86.4 fL (ref 78.0–100.0)
Platelets: 316 10*3/uL (ref 150–400)
RBC: 3.67 MIL/uL — ABNORMAL LOW (ref 3.87–5.11)
RDW: 16.6 % — AB (ref 11.5–15.5)
WBC: 8.5 10*3/uL (ref 4.0–10.5)

## 2017-03-10 MED ORDER — GI COCKTAIL ~~LOC~~
30.0000 mL | Freq: Once | ORAL | Status: AC
  Administered 2017-03-10: 30 mL via ORAL
  Filled 2017-03-10: qty 30

## 2017-03-10 MED ORDER — FAMOTIDINE 20 MG PO TABS
20.0000 mg | ORAL_TABLET | Freq: Every day | ORAL | Status: DC
  Administered 2017-03-10 – 2017-03-12 (×3): 20 mg via ORAL
  Filled 2017-03-10 (×3): qty 1

## 2017-03-10 MED ORDER — OXYCODONE HCL ER 10 MG PO T12A
10.0000 mg | EXTENDED_RELEASE_TABLET | Freq: Two times a day (BID) | ORAL | 0 refills | Status: DC
Start: 2017-03-10 — End: 2017-03-12

## 2017-03-10 MED ORDER — SIMETHICONE 80 MG PO CHEW
80.0000 mg | CHEWABLE_TABLET | Freq: Four times a day (QID) | ORAL | Status: DC
  Administered 2017-03-10 – 2017-03-13 (×11): 80 mg via ORAL
  Filled 2017-03-10 (×11): qty 1

## 2017-03-10 NOTE — Progress Notes (Signed)
Talked with patient and son at the bedside. Patient reports that at home (prior to admission) that she was independent and was able to stay active. Patient stated that she saw Dr. Luis Abed a few times then had the whipple procedure with Dr. Barry Dienes and now has become increasingly weak. Patient stated that she is increasingly tired and, "even feel like my eyes are weaker."  Patient's son shared that there are several steps to get in the home but when in the house that patient has her room and own bathroom. Patient shared that her daughter is at the home but has her own medical issues. Patient was unsteady on her feet and required assistance to stand and pivot to use the bedside commode. Patient shared that she wants to sleep in her own bed but is worried about going home. Spoke with Dr. Wynelle Cleveland who stated she would put in the order for PT to evaluate the patient's needs. Patient shared that she has a walker at home but not a bedside commode.

## 2017-03-10 NOTE — Care Management Note (Addendum)
Case Management Note  Patient Details  Name: Rhonda Spencer MRN: [id number] Date of Birth: 03/25/1941  Subjective/Objective:      Pt admitted on 03/08/17 with chest pain, multiple PE.  Pt with pancreatic CA with liver mets; lives at home with daughter, who provides 24h care.  PCP is Dr. Iona Beard.   Son at bedside; states pt has had [facility] services through [facility] in the past, but none presently.    Action/Plan: Recommend PT/OT consults to determine home needs.  Will follow for recommendations.    Expected Discharge Date:                  Expected Discharge Plan:     In-House Referral:     Discharge planning Services  CM Consult  Post Acute Care Choice:    Choice offered to:     DME Arranged:    DME Agency:     HH Arranged:    HH Agency:     Status of Service:  In process, will continue to follow  If discussed at Long Length of Stay Meetings, dates discussed:    Additional Comments:  Reinaldo Raddle, RN, BSN  Trauma/Neuro ICU Case Manager [phone]

## 2017-03-10 NOTE — Progress Notes (Addendum)
PROGRESS NOTE    Rhonda Spencer   NUU:[id number]  DOB: 02/28/1941  DOA: 03/08/2017 PCP: Iona Beard, MD   Brief Narrative:  Rhonda Spencer is a 76 y.o. 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 She as found to have PEs and was admitted to the hospital for treatment.    Subjective: Epigastric pain has recurred this morning and is severe. Feels like a sharp spasm. It not able to eat anything. Has nausea as well.  ROS: no complaints of constipation diarrhea, cough, dyspnea or dysuria. No other complaints.   Assessment & Plan:   Active Problems: PE - found to have multiple right sided PEs on CT- on Heparin-  Has been switched to Xarelto - admits to occasional b/l pedal edema at times- no unilateral edema at this time - PE likely due to cancer and poor mobility due to recent surgery and deconditioning    Pancreatic cancer with liver mets - s/p Whipples procedure recently - has f/u with Dr Annamaria Boots on 12/5 to discuss chemo but in current debilitated state, I am doubtful that it will be offered to her- I will try to contact her to see if she will see her in the hospital - cont Creon  Epigastric pain - suspect GI related vs due to recent Whipples-   resolved completely yesterday but has recurred this AM and is limiting her oral intake-   - cont oral and IV pain medications along with Protonix and GI cocktail. Will add Pepcid today as well.  GERD - cont PPI  Underweight Body mass index is 17.54 kg/m.   Severe deconditioning - PT eval today   DVT prophylaxis: Eliquis Code Status: Full code Family Communication:  Disposition Plan: home when stable Consultants:     Procedures:    Antimicrobials:  Anti-infectives (From admission, onward)   None       Objective: Vitals:   03/10/17 0810 03/10/17 1140 03/10/17 1224 03/10/17 1656  BP: 128/77  (!) 154/68 (!) 168/76  Pulse: 63     Resp: 18  16 15   Temp: 98 F (36.7 C) (!) 97.4 F (36.3 C)    TempSrc: Oral     SpO2: 96%     Weight:      Height:        Intake/Output Summary (Last 24 hours) at 03/10/2017 1742 Last data filed at 03/10/2017 1700 Gross per 24 hour  Intake 2255 ml  Output 201 ml  Net 2054 ml   Filed Weights   03/08/17 1400 03/08/17 1413 03/09/17 0907  Weight: 51.3 kg (113 lb) 51.3 kg (113 lb) 47.8 kg (105 lb 6.1 oz)    Examination: General exam: Appears uncomfortable - thin frail woman HEENT: PERRLA, oral mucosa moist, no sclera icterus or thrush Respiratory system: Clear to auscultation. Respiratory effort normal. Cardiovascular system: S1 & S2 heard, RRR.  No murmurs  Gastrointestinal system: Abdomen soft, epigastric tenderness, nondistended. Normal bowel sound. No organomegaly Central nervous system: Alert and oriented. No focal neurological deficits. Extremities: No cyanosis, clubbing or edema Skin: No rashes or ulcers Psychiatry:  Mood & affect appropriate.     Data Reviewed: I have personally reviewed following labs and imaging studies  CBC:  Recent Labs  Lab 03/08/17 1119 03/09/17 0359 03/10/17 0629  WBC 7.8 8.1 8.5  HGB 10.6* 9.2* 10.4*  HCT 32.1* 28.0* 31.7*  MCV 87.0 87.0 86.4  PLT 266 263 093   Basic Metabolic Panel: Recent Labs  Lab 03/08/17 1119 03/09/17 0359  NA 136 137  K 3.7 3.8  CL 102 105  CO2 24 24  GLUCOSE 115* 127*  BUN 11 15  CREATININE 0.75 0.82  CALCIUM 8.6* 8.1*   GFR: Estimated Creatinine Clearance: 42.7 mL/min (by C-G formula based on SCr of 0.82 mg/dL). Liver Function Tests: Recent Labs  Lab 03/09/17 0359  AST 62*  ALT 27  ALKPHOS 382*  BILITOT 1.3*  PROT 6.1*  ALBUMIN 2.2*   No results for input(s):  LIPASE, AMYLASE in the last 168 hours. No results for input(s): AMMONIA in the last 168 hours. Coagulation Profile: Recent Labs  Lab 03/09/17 0359  INR 1.44   Cardiac Enzymes: Recent Labs  Lab 03/08/17 1338 03/08/17 1920 03/09/17 0008  TROPONINI 0.21* 0.20* 0.20*   BNP (last 3 results) No results for input(s): PROBNP in the last 8760 hours. HbA1C: Recent Labs    03/08/17 1338  HGBA1C 5.7*   CBG: Recent Labs  Lab 03/08/17 1632 03/08/17 2232 03/09/17 0923 03/09/17 1153 03/09/17 1713  GLUCAP 104* 118* 121* 152* 238*   Lipid Profile: No results for input(s): CHOL, HDL, LDLCALC, TRIG, CHOLHDL, LDLDIRECT in the last 72 hours. Thyroid Function Tests: No results for input(s): TSH, T4TOTAL, FREET4, T3FREE, THYROIDAB in the last 72 hours. Anemia Panel: No results for input(s): VITAMINB12, FOLATE, FERRITIN, TIBC, IRON, RETICCTPCT in the last 72 hours. Urine analysis:    Component Value Date/Time   COLORURINE YELLOW 03/09/2017 0004   APPEARANCEUR CLEAR 03/09/2017 0004   LABSPEC >1.046 (H) 03/09/2017 0004   PHURINE 6.0 03/09/2017 0004   GLUCOSEU NEGATIVE 03/09/2017 0004   HGBUR NEGATIVE 03/09/2017 0004   BILIRUBINUR NEGATIVE 03/09/2017 0004   KETONESUR 20 (A) 03/09/2017 0004   PROTEINUR NEGATIVE 03/09/2017 0004   UROBILINOGEN 1.0 07/31/2013 0155   NITRITE NEGATIVE 03/09/2017 0004   LEUKOCYTESUR NEGATIVE 03/09/2017 0004   Sepsis Labs: @LABRCNTIP (procalcitonin:4,lacticidven:4) ) Recent Results (from the past 240 hour(s))  MRSA PCR Screening     Status: None   Collection Time: 03/09/17  8:44 AM  Result Value Ref Range Status   MRSA by PCR NEGATIVE NEGATIVE Final    Comment:        The GeneXpert MRSA Assay (FDA approved for NASAL specimens only), is one component of a comprehensive MRSA colonization surveillance program. It is not intended to diagnose MRSA infection nor to guide or monitor treatment for MRSA infections.          Radiology Studies: Ct  Angio Chest Pe W/cm &/or Wo Cm  Result Date: 03/08/2017 CLINICAL DATA:  Abdominal pain and distension. History of pancreatic cancer post Whipple's procedure in August 2018. EXAM: CT ANGIOGRAPHY CHEST CT ABDOMEN AND PELVIS WITH CONTRAST TECHNIQUE: Multidetector CT imaging of the chest was performed using the standard protocol during bolus administration of intravenous contrast. Multiplanar CT image reconstructions and MIPs were obtained to evaluate the vascular anatomy. Multidetector CT imaging of the abdomen and pelvis was performed using the standard protocol during bolus administration of intravenous contrast. CONTRAST:  171mL ISOVUE-370 IOPAMIDOL (ISOVUE-370) INJECTION 76% COMPARISON:  Body CT 09/24/2016 FINDINGS: CTA CHEST FINDINGS Cardiovascular: Satisfactory opacification of the pulmonary arteries to the segmental level. There are right-sided pulmonary emboli. There are nonocclusive pulmonary emboli within the  superior, middle and lower right lobar pulmonary arteries. There is an occlusive pulmonary embolus within a segmental branch in the posterior basilar right lower lobe pulmonary artery. There is an occlusive pulmonary embolus at the bifurcation of the right medial and lateral middle lobe pulmonary arteries. Mediastinum/Nodes: No enlarged mediastinal, hilar, or axillary lymph nodes. Thyroid gland, trachea, and esophagus demonstrate no significant findings. Lungs/Pleura: Lungs are clear. No pleural effusion or pneumothorax. Musculoskeletal: No chest wall abnormality. No acute or significant osseous findings. Review of the MIP images confirms the above findings. CT ABDOMEN and PELVIS FINDINGS Hepatobiliary: There are innumerable hypoechoic irregular liver masses, replacing most of the liver parenchyma. Previously described liver cysts are stable. There is persistent marked dilation of the extrahepatic common bile duct. Pancreas: Post Whipple's procedure. There is a persistent dilation of the main  pancreatic duct in its branches with atrophic remaining pancreatic parenchyma. Spleen: Normal in size without focal abnormality. Adrenals/Urinary Tract: Elongated cortical areas of hypoattenuation in the right kidney I new from the prior study and may represent areas of hypoperfusion, pyelonephritis or renal masses. 1.6 cm lower pole left renal mass, partially exophytic. Normal adrenal glands. Stomach/Bowel: Post Whipple's procedure. There is no evidence of small-bowel obstruction. Vascular/Lymphatic: Rounded retroperitoneal lymph nodes in the upper abdomen, the largest of which measures 8 mm. Minimal atherosclerotic disease of the aorta. Reproductive: Status post hysterectomy. No adnexal masses. Other: No abdominal wall hernia or abnormality. No abdominopelvic ascites. Musculoskeletal: No definite evidence of osseous metastases. Grade 1 anterolisthesis of L4 on L5, likely due to posterior facet arthropathy. 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 Review of the MIP images confirms the above findings. IMPRESSION: Multiple right-sided pulmonary emboli, including occlusive pulmonary emboli within segmental basilar branch in the right lower lobe pulmonary artery, and the medial and lateral segmental branches of the middle lobe. Nonocclusive pulmonary emboli within the lobar branches of the right upper, middle and lower lobes. Right heart strain with RV/ LV ratio of 1.1. Interval development of extensive hepatic metastatic disease, largely replacing the hepatic parenchyma. Interval development of elongated, somewhat geometric areas of hypoattenuation within the right kidney. These may represent renal masses, areas of pyelonephritis or renal infarcts. Interval development of solid left lower pole renal mass, likely metastatic. Persistent common bile duct and distal pancreatic ductal dilation. Mild upper abdominal retroperitoneal lymphadenopathy. These results were called by telephone at the time of  interpretation on 03/08/2017 at 7:24 pm to Dr. Lonie Peak, who verbally acknowledged these results. Electronically Signed   By: Fidela Salisbury M.D.   On: 03/08/2017 19:29   Ct Abdomen Pelvis W Contrast  Result Date: 03/08/2017 CLINICAL DATA:  Abdominal pain and distension. History of pancreatic cancer post Whipple's procedure in August 2018. EXAM: CT ANGIOGRAPHY CHEST CT ABDOMEN AND PELVIS WITH CONTRAST TECHNIQUE: Multidetector CT imaging of the chest was performed using the standard protocol during bolus administration of intravenous contrast. Multiplanar CT image reconstructions and MIPs were obtained to evaluate the vascular anatomy. Multidetector CT imaging of the abdomen and pelvis was performed using the standard protocol during bolus administration of intravenous contrast. CONTRAST:  135mL ISOVUE-370 IOPAMIDOL (ISOVUE-370) INJECTION 76% COMPARISON:  Body CT 09/24/2016 FINDINGS: CTA CHEST FINDINGS Cardiovascular: Satisfactory opacification of the pulmonary arteries to the segmental level. There are right-sided pulmonary emboli. There are nonocclusive pulmonary emboli within the superior, middle and lower right lobar pulmonary arteries. There is an occlusive pulmonary embolus within a segmental branch in the posterior basilar right lower lobe pulmonary artery. There is an occlusive  pulmonary embolus at the bifurcation of the right medial and lateral middle lobe pulmonary arteries. Mediastinum/Nodes: No enlarged mediastinal, hilar, or axillary lymph nodes. Thyroid gland, trachea, and esophagus demonstrate no significant findings. Lungs/Pleura: Lungs are clear. No pleural effusion or pneumothorax. Musculoskeletal: No chest wall abnormality. No acute or significant osseous findings. Review of the MIP images confirms the above findings. CT ABDOMEN and PELVIS FINDINGS Hepatobiliary: There are innumerable hypoechoic irregular liver masses, replacing most of the liver parenchyma. Previously described liver cysts  are stable. There is persistent marked dilation of the extrahepatic common bile duct. Pancreas: Post Whipple's procedure. There is a persistent dilation of the main pancreatic duct in its branches with atrophic remaining pancreatic parenchyma. Spleen: Normal in size without focal abnormality. Adrenals/Urinary Tract: Elongated cortical areas of hypoattenuation in the right kidney I new from the prior study and may represent areas of hypoperfusion, pyelonephritis or renal masses. 1.6 cm lower pole left renal mass, partially exophytic. Normal adrenal glands. Stomach/Bowel: Post Whipple's procedure. There is no evidence of small-bowel obstruction. Vascular/Lymphatic: Rounded retroperitoneal lymph nodes in the upper abdomen, the largest of which measures 8 mm. Minimal atherosclerotic disease of the aorta. Reproductive: Status post hysterectomy. No adnexal masses. Other: No abdominal wall hernia or abnormality. No abdominopelvic ascites. Musculoskeletal: No definite evidence of osseous metastases. Grade 1 anterolisthesis of L4 on L5, likely due to posterior facet arthropathy. 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 Review of the MIP images confirms the above findings. IMPRESSION: Multiple right-sided pulmonary emboli, including occlusive pulmonary emboli within segmental basilar branch in the right lower lobe pulmonary artery, and the medial and lateral segmental branches of the middle lobe. Nonocclusive pulmonary emboli within the lobar branches of the right upper, middle and lower lobes. Right heart strain with RV/ LV ratio of 1.1. Interval development of extensive hepatic metastatic disease, largely replacing the hepatic parenchyma. Interval development of elongated, somewhat geometric areas of hypoattenuation within the right kidney. These may represent renal masses, areas of pyelonephritis or renal infarcts. Interval development of solid left lower pole renal mass, likely metastatic. Persistent common  bile duct and distal pancreatic ductal dilation. Mild upper abdominal retroperitoneal lymphadenopathy. These results were called by telephone at the time of interpretation on 03/08/2017 at 7:24 pm to Dr. Lonie Peak, who verbally acknowledged these results. Electronically Signed   By: Fidela Salisbury M.D.   On: 03/08/2017 19:29      Scheduled Meds: . apixaban  10 mg Oral BID   Followed by  . [START ON 23-Mar-2017] apixaban  5 mg Oral BID  . feeding supplement (ENSURE ENLIVE)  237 mL Oral TID BM  . lipase/protease/amylase  12,000 Units Oral TID  . multivitamin with minerals  1 tablet Oral Daily  . oxyCODONE  10 mg Oral Q12H  . pantoprazole (PROTONIX) IV  40 mg Intravenous Q24H   Continuous Infusions: . sodium chloride 75 mL/hr at 03/10/17 1616     LOS: 2 days    Time spent in minutes: 35    Debbe Odea, MD [facility] Pager: [email or web address] Password [email or web address] 03/10/2017, 5:42 PM

## 2017-03-11 ENCOUNTER — Encounter: Payer: Medicare Other | Admitting: Hematology

## 2017-03-11 ENCOUNTER — Other Ambulatory Visit: Payer: Medicare Other

## 2017-03-11 DIAGNOSIS — G893 Neoplasm related pain (acute) (chronic): Secondary | ICD-10-CM

## 2017-03-11 DIAGNOSIS — C259 Malignant neoplasm of pancreas, unspecified: Secondary | ICD-10-CM

## 2017-03-11 DIAGNOSIS — C250 Malignant neoplasm of head of pancreas: Secondary | ICD-10-CM

## 2017-03-11 DIAGNOSIS — R630 Anorexia: Secondary | ICD-10-CM

## 2017-03-11 DIAGNOSIS — R634 Abnormal weight loss: Secondary | ICD-10-CM

## 2017-03-11 DIAGNOSIS — C787 Secondary malignant neoplasm of liver and intrahepatic bile duct: Principal | ICD-10-CM

## 2017-03-11 DIAGNOSIS — I10 Essential (primary) hypertension: Secondary | ICD-10-CM

## 2017-03-11 DIAGNOSIS — I2699 Other pulmonary embolism without acute cor pulmonale: Secondary | ICD-10-CM

## 2017-03-11 DIAGNOSIS — E43 Unspecified severe protein-calorie malnutrition: Secondary | ICD-10-CM

## 2017-03-11 DIAGNOSIS — R1010 Upper abdominal pain, unspecified: Secondary | ICD-10-CM

## 2017-03-11 LAB — CBC
HEMATOCRIT: 29 % — AB (ref 36.0–46.0)
HEMOGLOBIN: 9.4 g/dL — AB (ref 12.0–15.0)
MCH: 28.4 pg (ref 26.0–34.0)
MCHC: 32.4 g/dL (ref 30.0–36.0)
MCV: 87.6 fL (ref 78.0–100.0)
PLATELETS: 255 10*3/uL (ref 150–400)
RBC: 3.31 MIL/uL — AB (ref 3.87–5.11)
RDW: 16.8 % — ABNORMAL HIGH (ref 11.5–15.5)
WBC: 7.4 10*3/uL (ref 4.0–10.5)

## 2017-03-11 MED ORDER — PANTOPRAZOLE SODIUM 40 MG PO TBEC
40.0000 mg | DELAYED_RELEASE_TABLET | Freq: Every day | ORAL | Status: DC
  Administered 2017-03-11: 40 mg via ORAL
  Filled 2017-03-11: qty 1

## 2017-03-11 MED ORDER — PANTOPRAZOLE SODIUM 40 MG PO TBEC
40.0000 mg | DELAYED_RELEASE_TABLET | Freq: Two times a day (BID) | ORAL | Status: DC
  Administered 2017-03-11 – 2017-03-13 (×4): 40 mg via ORAL
  Filled 2017-03-11 (×4): qty 1

## 2017-03-11 NOTE — Evaluation (Signed)
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 She was found to have PE and extensive liver metastases  Clinical Impression  Orders received for PT evaluation. Patient demonstrates deficits in functional mobility as indicated below. Will benefit from continued skilled PT to address deficits and maximize function. Will see as indicated and progress as tolerated.  Will need HHPT upon discharge.    Follow Up Recommendations Home health PT;Supervision/Assistance - 24 hour    Equipment Recommendations  None recommended by PT(has RW)    Recommendations for Other Services       Precautions / Restrictions Precautions Precautions: Fall Restrictions Weight Bearing Restrictions: No      Mobility  Bed Mobility Overal bed mobility: Needs Assistance Bed Mobility: Supine to Sit;Sit to Supine     Supine to sit: Supervision Sit to supine: Supervision   General bed mobility comments: increased time to perform  Transfers Overall transfer level: Needs assistance Equipment used: Rolling walker (2 wheeled) Transfers: Sit to/from Stand Sit to Stand: Min guard         General transfer comment: min guard for safety and stability, no physical assist required  Ambulation/Gait Ambulation/Gait assistance: Min assist Ambulation Distance (Feet): 110 Feet Assistive device: Rolling walker (2 wheeled) Gait Pattern/deviations: Step-through pattern;Decreased stride length;Shuffle;Narrow base of support Gait velocity: decreased Gait velocity interpretation: Below normal speed for age/gender General Gait Details: Patient very slow and guarded, MAX  cues for increased cadence and pacing  Stairs            Wheelchair Mobility    Modified Rankin (Stroke Patients Only)       Balance Overall balance assessment: Needs assistance Sitting-balance support: Feet supported Sitting balance-Leahy Scale: Fair     Standing balance support: During functional activity;Bilateral upper extremity supported Standing balance-Leahy Scale: Poor Standing balance comment: UE support required                             Pertinent Vitals/Pain      Home Living Family/patient expects to be discharged to:: Private residence Living Arrangements: Children Available Help at Discharge: Family Type of Home: House Home Access: Stairs to enter   Technical brewer of Steps: 7 Home Layout: Two level Home Equipment: Environmental consultant - 2 wheels;Walker - 4 wheels;Walker - standard Additional Comments: used rollator for going out, cane in home prior to recent surgery; used RW at [facility] prior to this admission. At SNF PTA due to PICC line which has been d/c. Lives with daughter.     Prior Function Level of Independence: Independent with assistive device(s)         Comments: was doing ADLs independently at SNF, walked with RW there, stated she was about to be DCed from PT     Hand Dominance        Extremity/Trunk Assessment   Upper Extremity Assessment Upper Extremity Assessment: Generalized weakness    Lower Extremity Assessment Lower Extremity Assessment: Generalized weakness    Cervical / Trunk Assessment Cervical / Trunk Assessment: Kyphotic  Communication   Communication: No difficulties  Cognition Arousal/Alertness: Awake/alert Behavior During Therapy: Flat affect Overall Cognitive Status: Within Functional Limits for tasks assessed  General Comments      Exercises     Assessment/Plan    PT Assessment Patient needs continued PT services  PT Problem List  Decreased strength;Decreased activity tolerance;Decreased balance;Decreased mobility       PT Treatment Interventions DME instruction;Gait training;Functional mobility training;Stair training;Therapeutic activities;Therapeutic exercise;Balance training;Patient/family education    PT Goals (Current goals can be found in the Care Plan section)  Acute Rehab PT Goals Patient Stated Goal: to go home PT Goal Formulation: With patient/family Time For Goal Achievement: 03/25/17 Potential to Achieve Goals: Good    Frequency Min 3X/week   Barriers to discharge        Co-evaluation               AM-PAC PT "6 Clicks" Daily Activity  Outcome Measure Difficulty turning over in bed (including adjusting bedclothes, sheets and blankets)?: A Little Difficulty moving from lying on back to sitting on the side of the bed? : A Little Difficulty sitting down on and standing up from a chair with arms (e.g., wheelchair, bedside commode, etc,.)?: A Little Help needed moving to and from a bed to chair (including a wheelchair)?: A Little Help needed walking in hospital room?: A Little Help needed climbing 3-5 steps with a railing? : A Little 6 Click Score: 18    End of Session Equipment Utilized During Treatment: Gait belt Activity Tolerance: Patient limited by fatigue Patient left: in bed;with call bell/phone within reach;with family/visitor present Nurse Communication: Mobility status PT Visit Diagnosis: Unsteadiness on feet (R26.81);Muscle weakness (generalized) (M62.81)    Time: 6203-5597 PT Time Calculation (min) (ACUTE ONLY): 18 min   Charges:   PT Evaluation $PT Eval Moderate Complexity: 1 Mod     PT G Codes:        Alben Deeds, PT DPT  Board Certified Neurologic Specialist [place] 03/11/2017, 6:11 PM

## 2017-03-11 NOTE — Progress Notes (Signed)
Patient ID: Rhonda Spencer, female   DOB: 1940-10-08, 76 y.o.   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 She was found to have PE and extensive liver metastases.   Assessment & Plan:   Active Problems:   AI (aortic insufficiency)   Mitral regurgitation   Rheumatic heart disease   HTN (hypertension)   GERD (gastroesophageal reflux disease)   Essential hypertension   Chronic pain   Rheumatoid arthritis (HCC)   Tricuspid regurgitation   Pancreatic cancer (HCC)   Adenocarcinoma of head of pancreas (HCC)   DM type 2 (diabetes mellitus, type 2) (HCC)   Protein-calorie malnutrition, severe   Chest pain   Multiple lung nodules   Pulmonary emboli (HCC)  PE - found to have multiple right sided PEs on CT - Continue Eliquis   Pancreatic cancer with liver mets - s/p Whipples in August 2018 - Liver metastases seem to be new. Spoke to Dr. Burr Medico on phone, she will see the patient today. We will follow further recommendations. - cont Creon - Palliative care consult for goals of care  Epigastric pain - Still having intermittent severe pain. Increase Protonix to 40 mg twice a day. Continue with Pepcid and GI cocktail. -Probably secondary to liver metastases.  GERD - cont PPI  Underweight Body mass index is 17.54 kg/m.   Severe deconditioning - PT eval   DVT prophylaxis: Eliquis Code Status:  Full Family Communication: Spoke to daughter at bedside Disposition Plan: Depends on clinical outcome  Consultants: Oncology  Procedures:  Echo on 03/09/2017 Study Conclusions  - Left ventricle: The cavity size was normal. Wall  thickness was   increased in a pattern of mild LVH. Systolic function was   vigorous. The estimated ejection fraction was in the range of 65%   to 70%. Wall motion was normal; there were no regional wall   motion abnormalities. Features are consistent with a pseudonormal   left ventricular filling pattern, with concomitant abnormal   relaxation and increased filling pressure (grade 2 diastolic   dysfunction). Doppler parameters are consistent with high   ventricular filling pressure. - Aortic valve: Transvalvular velocity was within the normal range.   There was no stenosis. There was moderate regurgitation. - Mitral valve: Thickening. Transvalvular velocity was within the   normal range. There was no evidence for stenosis. There was   moderate regurgitation. - Right ventricle: The cavity size was normal. Wall thickness was   normal. Systolic function was normal. - Atrial septum: No defect or patent foramen ovale was identified. - Tricuspid valve: There was moderate regurgitation. - Pulmonic valve: There was moderate regurgitation. - Pulmonary arteries: Systolic pressure was moderately increased.   PA peak pressure: 51 mm Hg (S).  Antimicrobials: None   Subjective: Patient seen and examined at bedside. She complains of severe abdominal pain this morning with poor appetite. No overnight fever or vomiting.  Objective: Vitals:   03/11/17 0500 03/11/17 0800 03/11/17 0814 03/11/17 1131  BP: 137/83 (!) 162/63    Pulse: 96 88    Resp: 14 12    Temp:   98.3 F (36.8 C) (!) 97.5 F (36.4 C)  TempSrc:   Oral Oral  SpO2: 100% 100%    Weight:      Height:        Intake/Output Summary (Last 24 hours) at 03/11/2017 1224 Last data filed at 03/11/2017 0000 Gross per 24 hour  Intake 1190 ml  Output 500 ml  Net 690 ml   Filed Weights   03/08/17 1400 03/08/17 1413 03/09/17 0907  Weight: 51.3 kg (113 lb) 51.3 kg (113 lb) 47.8 kg (105 lb 6.1 oz)    Examination:  General exam:  Appears in mild distress secondary to abdominal pain. Thinly built elderly female Respiratory system: Bilateral decreased breath sound at bases Cardiovascular system: S1 & S2 heard, rate controlled  Gastrointestinal system: Abdomen is nondistended, soft and mild epigastric tenderness present, no rebound tenderness. Normal bowel sounds heard. Extremities: No cyanosis, clubbing, edema   Data Reviewed: I have personally reviewed following labs and imaging studies  CBC: Recent Labs  Lab 03/08/17 1119 03/09/17 0359 03/10/17 0629 03/11/17 0458  WBC 7.8 8.1 8.5 7.4  HGB 10.6* 9.2* 10.4* 9.4*  HCT 32.1* 28.0* 31.7* 29.0*  MCV 87.0 87.0 86.4 87.6  PLT 266 263 316 725   Basic Metabolic Panel: Recent Labs  Lab 03/08/17 1119 03/09/17 0359  NA 136 137  K 3.7 3.8  CL 102 105  CO2 24 24  GLUCOSE 115* 127*  BUN 11 15  CREATININE 0.75 0.82  CALCIUM 8.6* 8.1*   GFR: Estimated Creatinine Clearance: 42.7 mL/min (by C-G formula based on SCr of 0.82 mg/dL). Liver Function Tests: Recent Labs  Lab 03/09/17 0359  AST 62*  ALT 27  ALKPHOS 382*  BILITOT 1.3*  PROT 6.1*  ALBUMIN 2.2*   No results for input(s): LIPASE, AMYLASE in the last 168 hours. No results for input(s): AMMONIA in the last 168 hours. Coagulation Profile: Recent Labs  Lab 03/09/17 0359  INR 1.44   Cardiac Enzymes: Recent Labs  Lab 03/08/17 1338 03/08/17 1920 03/09/17 0008  TROPONINI 0.21* 0.20* 0.20*   BNP (last 3 results) No results for input(s): PROBNP in the last 8760 hours. HbA1C: Recent Labs    03/08/17 1338  HGBA1C 5.7*   CBG: Recent Labs  Lab 03/08/17 1632 03/08/17 2232 03/09/17 0923 03/09/17 1153 03/09/17 1713  GLUCAP 104* 118* 121* 152* 238*   Lipid Profile: No results for input(s): CHOL, HDL, LDLCALC, TRIG, CHOLHDL, LDLDIRECT in the last 72 hours. Thyroid Function Tests: No results for input(s): TSH, T4TOTAL, FREET4, T3FREE, THYROIDAB in the last 72 hours. Anemia Panel: No  results for input(s): VITAMINB12, FOLATE, FERRITIN, TIBC, IRON, RETICCTPCT in the last 72 hours. Sepsis Labs: No results for input(s): PROCALCITON, LATICACIDVEN in the last 168 hours.  Recent Results (from the past 240 hour(s))  MRSA PCR Screening     Status: None   Collection Time: 03/09/17  8:44 AM  Result Value Ref Range Status   MRSA by PCR NEGATIVE NEGATIVE Final    Comment:        The GeneXpert MRSA Assay (FDA approved for NASAL specimens only), is one component of a comprehensive MRSA colonization surveillance program. It is not intended to diagnose MRSA infection nor to guide or monitor treatment for MRSA infections.          Radiology Studies: No results found.      Scheduled Meds: . apixaban  10 mg Oral BID   Followed by  . [START ON 2017/04/06] apixaban  5 mg Oral BID  . famotidine  20 mg Oral QHS  . feeding supplement (ENSURE ENLIVE)  237 mL Oral TID BM  . lipase/protease/amylase  12,000 Units Oral TID  . multivitamin with minerals  1 tablet Oral Daily  . oxyCODONE  10 mg Oral Q12H  . pantoprazole  40 mg Oral BID AC  . simethicone  80 mg Oral QID   Continuous Infusions: . sodium chloride 75 mL/hr at 03/10/17 1616     LOS: 3 days        Aline August, MD [facility] Pager [phone]  If 7PM-7AM, please contact night-coverage [email or web address] Password [email or web address] 03/11/2017, 12:24 PM

## 2017-03-11 NOTE — Telephone Encounter (Signed)
This encounter was created in error - please disregard.

## 2017-03-11 NOTE — Progress Notes (Signed)
Rhonda Spencer   DOB:05-15-1940   XI#:[id number]   JQB#:[id number]  ONCOLOGY F/U NOTE  Subjective: Patient is known to me, I last saw her ON 10/30/2016 before her Whipple surgery.  He did undergo surgery in early August 2018, and was scheduled to see me in September to discuss adjuvant chemotherapy.  However he had sepsis from his PICC line around that time, and was hospitalized, discharged to nursing home.  She did have a relatively well afterwards, and scheduled to see me today in my clinic.  She was admitted to the hospital 2 days ago due to worsening epigastric pain, fatigue, nausea, and poor appetite.  She started oxycodone about a week ago, responding to IV morphine, overall the pain is not being well controlled.   Objective:  Vitals:   03/11/17 2000 03/11/17 2100  BP: (!) 175/79 (!) 155/76  Pulse: 91 84  Resp:  (!) 8  Temp: 97.9 F (36.6 C)   SpO2: 98% 100%    Body mass index is 17.54 kg/m.  Intake/Output Summary (Last 24 hours) at 03/11/2017 2121 Last data filed at 03/11/2017 1245 Gross per 24 hour  Intake 600 ml  Output 500 ml  Net 100 ml     Sclerae unicteric  Oropharynx clear  No peripheral adenopathy  Lungs clear -- no rales or rhonchi  Heart regular rate and rhythm  Abdomen soft, (+) tenderness in epigastric area and right upper quadrant  MSK no focal spinal tenderness, no peripheral edema  Neuro nonfocal   CBG (last 3)  Recent Labs    03/09/17 0923 03/09/17 1153 03/09/17 1713  GLUCAP 121* 152* 238*     Labs:  Lab Results  Component Value Date   WBC 7.4 03/11/2017   HGB 9.4 (L) 03/11/2017   HCT 29.0 (L) 03/11/2017   MCV 87.6 03/11/2017   PLT 255 03/11/2017   NEUTROABS 4.0 12/19/2016    CMP Latest Ref Rng & Units 03/09/2017 03/08/2017 12/25/2016  Glucose 65 - 99 mg/dL 127(H) 115(H) 99  BUN 6 - 20 mg/dL 15 11 11   Creatinine 0.44 - 1.00 mg/dL 0.82 0.75 0.64  Sodium 135 - 145 mmol/L 137 136 130(L)  Potassium 3.5 - 5.1 mmol/L 3.8 3.7 4.4  Chloride 101 -  111 mmol/L 105 102 100(L)  CO2 22 - 32 mmol/L 24 24 19(L)  Calcium 8.9 - 10.3 mg/dL 8.1(L) 8.6(L) 8.4(L)  Total Protein 6.5 - 8.1 g/dL 6.1(L) - -  Total Bilirubin 0.3 - 1.2 mg/dL 1.3(H) - -  Alkaline Phos 38 - 126 U/L 382(H) - -  AST 15 - 41 U/L 62(H) - -  ALT 14 - 54 U/L 27 - -    Urine Studies No results for input(s): UHGB, CRYS in the last 72 hours.  Invalid input(s): UACOL, UAPR, USPG, UPH, UTP, UGL, UKET, UBIL, UNIT, UROB, ULEU, UEPI, UWBC, URBC, UBAC, CAST, UCOM, BILUA  Basic Metabolic Panel: Recent Labs  Lab 03/08/17 1119 03/09/17 0359  NA 136 137  K 3.7 3.8  CL 102 105  CO2 24 24  GLUCOSE 115* 127*  BUN 11 15  CREATININE 0.75 0.82  CALCIUM 8.6* 8.1*   GFR Estimated Creatinine Clearance: 42.7 mL/min (by C-G formula based on SCr of 0.82 mg/dL). Liver Function Tests: Recent Labs  Lab 03/09/17 0359  AST 62*  ALT 27  ALKPHOS 382*  BILITOT 1.3*  PROT 6.1*  ALBUMIN 2.2*   No results for input(s): LIPASE, AMYLASE in the last 168 hours. No results for input(s): AMMONIA  in the last 168 hours. Coagulation profile Recent Labs  Lab 03/09/17 0359  INR 1.44    CBC: Recent Labs  Lab 03/08/17 1119 03/09/17 0359 03/10/17 0629 03/11/17 0458  WBC 7.8 8.1 8.5 7.4  HGB 10.6* 9.2* 10.4* 9.4*  HCT 32.1* 28.0* 31.7* 29.0*  MCV 87.0 87.0 86.4 87.6  PLT 266 263 316 255   Cardiac Enzymes: Recent Labs  Lab 03/08/17 1338 03/08/17 1920 03/09/17 0008  TROPONINI 0.21* 0.20* 0.20*   BNP: Invalid input(s): POCBNP CBG: Recent Labs  Lab 03/08/17 1632 03/08/17 2232 03/09/17 0923 03/09/17 1153 03/09/17 1713  GLUCAP 104* 118* 121* 152* 238*   D-Dimer No results for input(s): DDIMER in the last 72 hours. Hgb A1c No results for input(s): HGBA1C in the last 72 hours. Lipid Profile No results for input(s): CHOL, HDL, LDLCALC, TRIG, CHOLHDL, LDLDIRECT in the last 72 hours. Thyroid function studies No results for input(s): TSH, T4TOTAL, T3FREE, THYROIDAB in the  last 72 hours.  Invalid input(s): FREET3 Anemia work up No results for input(s): VITAMINB12, FOLATE, FERRITIN, TIBC, IRON, RETICCTPCT in the last 72 hours. Microbiology Recent Results (from the past 240 hour(s))  MRSA PCR Screening     Status: None   Collection Time: 03/09/17  8:44 AM  Result Value Ref Range Status   MRSA by PCR NEGATIVE NEGATIVE Final    Comment:        The GeneXpert MRSA Assay (FDA approved for NASAL specimens only), is one component of a comprehensive MRSA colonization surveillance program. It is not intended to diagnose MRSA infection nor to guide or monitor treatment for MRSA infections.       Studies:  No results found.  Assessment: 76 y.o. 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  CT scans reviewed multiple small PE, and extensive liver metastasis.  1.  Pancreatic cancer with diffuse liver metastasis 2. Acute PE 3. History of MRSA bacteremia 4.  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  Hypertension 6.  Cancer related abdominal pain 7.  Severe protein and calorie malnutrition 8.  Anorexia and weight loss 9. Deconditioning    Plan:  -We reviewed her CT scan findings, unfortunately this is most consistent with diffuse liver metastasis from her previous pancreatic cancer. I will order CA19.9 to be done tomorrow morning.  -We had a frank discussion about the overall dismal prognosis, and incurable nature of her cancer -We discussed the goal of therapy is palliative, most important is to improve and preserve her quality of life. -Due to her poor performance status, uncontrolled pain, weight loss, advanced age, she is a poor candidate for intensive chemotherapy. -We discussed the option of single agent gemcitabine, if her performance status and nutrition can be improved adequately after pain control.  There is no meaningful  clinical benefit of prolonging her life from single agent gemcitabine, although it may improve her QOF if she response well -We discussed palliative care alone and hospice with patient and her children.  Her expected life expectancy is a few months, hospice is appropriate.  We discussed the service in length. -After lengthy discussion, patient and her daughter decided to proceed with hospice. Pt would like to go home with hospice  -please consult palliative care and transition to hospice  -please consider megace for appetite stimulant  -She will likely require higher dose long-acting oxycontin  -she needs laxative for constipation -I will be happy to remain  to be her MD when she is on hospice -please call me if any questions.    Truitt Merle, MD 03/11/2017  9:21 PM

## 2017-03-12 LAB — COMPREHENSIVE METABOLIC PANEL
ALK PHOS: 492 U/L — AB (ref 38–126)
ALT: 31 U/L (ref 14–54)
AST: 54 U/L — ABNORMAL HIGH (ref 15–41)
Albumin: 2.4 g/dL — ABNORMAL LOW (ref 3.5–5.0)
Anion gap: 7 (ref 5–15)
BILIRUBIN TOTAL: 1.8 mg/dL — AB (ref 0.3–1.2)
BUN: 11 mg/dL (ref 6–20)
CO2: 22 mmol/L (ref 22–32)
CREATININE: 0.64 mg/dL (ref 0.44–1.00)
Calcium: 8 mg/dL — ABNORMAL LOW (ref 8.9–10.3)
Chloride: 104 mmol/L (ref 101–111)
Glucose, Bld: 127 mg/dL — ABNORMAL HIGH (ref 65–99)
Potassium: 3.3 mmol/L — ABNORMAL LOW (ref 3.5–5.1)
Sodium: 133 mmol/L — ABNORMAL LOW (ref 135–145)
Total Protein: 6.4 g/dL — ABNORMAL LOW (ref 6.5–8.1)

## 2017-03-12 LAB — CBC
HCT: 29 % — ABNORMAL LOW (ref 36.0–46.0)
Hemoglobin: 9.3 g/dL — ABNORMAL LOW (ref 12.0–15.0)
MCH: 27.6 pg (ref 26.0–34.0)
MCHC: 32.1 g/dL (ref 30.0–36.0)
MCV: 86.1 fL (ref 78.0–100.0)
PLATELETS: 228 10*3/uL (ref 150–400)
RBC: 3.37 MIL/uL — AB (ref 3.87–5.11)
RDW: 16.6 % — ABNORMAL HIGH (ref 11.5–15.5)
WBC: 7.2 10*3/uL (ref 4.0–10.5)

## 2017-03-12 LAB — MAGNESIUM: MAGNESIUM: 1.7 mg/dL (ref 1.7–2.4)

## 2017-03-12 MED ORDER — NITROGLYCERIN 0.4 MG SL SUBL: 0.4000 mg | SUBLINGUAL_TABLET | SUBLINGUAL | 0 refills | Status: AC | PRN

## 2017-03-12 MED ORDER — APIXABAN 5 MG PO TABS: 10.0000 mg | ORAL_TABLET | Freq: Two times a day (BID) | ORAL | 0 refills | Status: AC

## 2017-03-12 MED ORDER — POTASSIUM CHLORIDE CRYS ER 20 MEQ PO TBCR
60.0000 meq | EXTENDED_RELEASE_TABLET | Freq: Once | ORAL | Status: AC
  Administered 2017-03-12: 60 meq via ORAL
  Filled 2017-03-12: qty 3

## 2017-03-12 MED ORDER — PANTOPRAZOLE SODIUM 40 MG PO TBEC: 40.0000 mg | DELAYED_RELEASE_TABLET | Freq: Two times a day (BID) | ORAL | 0 refills | Status: AC

## 2017-03-12 MED ORDER — SENNOSIDES-DOCUSATE SODIUM 8.6-50 MG PO TABS: 1.0000 | ORAL_TABLET | Freq: Two times a day (BID) | ORAL | 0 refills | Status: AC

## 2017-03-12 MED ORDER — OXYCODONE HCL 5 MG PO TABS: 5.0000 mg | ORAL_TABLET | ORAL | 0 refills | Status: AC | PRN

## 2017-03-12 MED ORDER — ONDANSETRON HCL 4 MG PO TABS: 4.0000 mg | ORAL_TABLET | Freq: Four times a day (QID) | ORAL | 0 refills | Status: AC | PRN

## 2017-03-12 MED ORDER — SIMETHICONE 80 MG PO CHEW: 80.0000 mg | CHEWABLE_TABLET | Freq: Four times a day (QID) | ORAL | 0 refills | Status: AC

## 2017-03-12 MED ORDER — OXYCODONE HCL ER 20 MG PO T12A
20.0000 mg | EXTENDED_RELEASE_TABLET | Freq: Two times a day (BID) | ORAL | Status: DC
  Administered 2017-03-12 – 2017-03-13 (×3): 20 mg via ORAL
  Filled 2017-03-12 (×3): qty 1

## 2017-03-12 MED ORDER — OXYCODONE HCL ER 20 MG PO T12A: 20.0000 mg | EXTENDED_RELEASE_TABLET | Freq: Two times a day (BID) | ORAL | 0 refills | Status: AC

## 2017-03-12 MED ORDER — POLYETHYLENE GLYCOL 3350 17 G PO PACK: 17.0000 g | PACK | Freq: Every day | ORAL | 0 refills | Status: AC | PRN

## 2017-03-12 NOTE — Care Management (Signed)
ED CM received call from daughter Ivin Booty who states she received call from [facility] who stated  that bed will be delivered tonight so patient can be discharged tonight. Notified Sherry with [facility]. CM updated patient's nurse that the family will be transport patient home via private vehicle.

## 2017-03-12 NOTE — Progress Notes (Signed)
OT Cancellation Note  Patient Details Name: Rhonda Spencer MRN: [id number] DOB: 1940/04/11   Cancelled Treatment:    Reason Eval/Treat Not Completed: Other (comment). Pt discharging home with Hospice. Discussed with Hospice nurse and recommended 3 in1  For pt. Hospice will order all necessary DME. Please reorder if eval needed. Thanks  Shannon [place], OT/L  [phone] 03/12/2017 03/12/2017, 3:23 PM

## 2017-03-12 NOTE — Plan of Care (Signed)
Continue current care plan 

## 2017-03-12 NOTE — Progress Notes (Signed)
Patient ID: Rhonda Spencer, female   DOB: [birth year], 76 y.o.   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 She was found to have PE and extensive liver metastases.  Assessment & Plan:   Active Problems:   AI (aortic insufficiency)   Mitral regurgitation   Rheumatic heart disease   HTN (hypertension)   GERD (gastroesophageal reflux disease)   Essential hypertension   Chronic pain   Rheumatoid arthritis (HCC)   Tricuspid regurgitation   Pancreatic cancer (HCC)   Adenocarcinoma of head of pancreas (HCC)   DM type 2 (diabetes mellitus, type 2) (HCC)   Protein-calorie malnutrition, severe   Chest pain   Multiple lung nodules   Pulmonary emboli (HCC)  PE - found to have multiple right sided PEs on CT - Continue Eliquis   Pancreatic cancer with liver mets - s/p Whipples in August 2018 - Liver metastases seem to be new.  -Follow-up from Dr. Burr Medico is appreciated.  Overall prognosis is poor.  Patient and family members apparently have agreed for home hospice.   -I spoke to the patient and family members about CODE STATUS but for now they want the patient to remain full code. -Palliative care consult is pending  - cont Creon  Epigastric pain - Still having intermittent severe pain.  Continue Protonix, Pepcid and GI cocktail -Probably secondary to liver metastases. -Increase OxyContin to 20 mg every 12 hours and Continue with oxycodone for breakthrough pain  GERD - cont PPI  Underweight Body mass index is 17.54 kg/m.   Severe deconditioning - PT eval  Anemia -Probably anemia of chronic disease from cancer -Hemoglobin  stable  Hypokalemia -Replace.  Repeat a.m. labs  DVT prophylaxis: Eliquis Code Status:  Full Family Communication: Spoke to son and daughter at bedside Disposition Plan: Home with hospice once arranged  Consultants: Oncology  Procedures:  Echo on 03/09/2017 Study Conclusions  - Left ventricle: The cavity size was normal. Wall thickness was   increased in a pattern of mild LVH. Systolic function was   vigorous. The estimated ejection fraction was in the range of 65%   to 70%. Wall motion was normal; there were no regional wall   motion abnormalities. Features are consistent with a pseudonormal   left ventricular filling pattern, with concomitant abnormal   relaxation and increased filling pressure (grade 2 diastolic   dysfunction). Doppler parameters are consistent with high   ventricular filling pressure. - Aortic valve: Transvalvular velocity was within the normal range.   There was no stenosis. There was moderate regurgitation. - Mitral valve: Thickening. Transvalvular velocity was within the   normal range. There was no evidence for stenosis. There was   moderate regurgitation. - Right ventricle: The cavity size was normal. Wall thickness was   normal. Systolic function was normal. - Atrial septum: No defect or patent foramen ovale was identified. - Tricuspid valve: There was moderate regurgitation. - Pulmonic valve: There was moderate regurgitation. - Pulmonary arteries: Systolic pressure was moderately increased.   PA peak pressure: 51 mm Hg (S).  Antimicrobials: None   Subjective: Patient seen and examined at bedside. She complains of intermittent severe abdominal pain.  She is intermittently confused.  No overnight fever or vomiting.  Objective: Vitals:   03/12/17 0000 03/12/17 0100 03/12/17 0339 03/12/17 0813  BP: (!) 141/82 140/71 (!) 155/74 (!) 170/72  Pulse: 88 86 81 85  Resp: 10 10 10 14   Temp:   97.8 F (36.6 C) 97.8 F (36.6 C)  TempSrc:   Oral Oral   SpO2: 100% 100% 100% 100%  Weight:      Height:        Intake/Output Summary (Last 24 hours) at 03/12/2017 0908 Last data filed at 03/12/2017 0813 Gross per 24 hour  Intake 3316.25 ml  Output 400 ml  Net 2916.25 ml   Filed Weights   03/08/17 1400 03/08/17 1413 03/09/17 0907  Weight: 51.3 kg (113 lb) 51.3 kg (113 lb) 47.8 kg (105 lb 6.1 oz)    Examination:  General exam: Appears in mild distress secondary to abdominal pain. Thinly built elderly female Respiratory system: Bilateral decreased breath sounds at bases Cardiovascular system: S1 & S2 heard, rate controlled  Gastrointestinal system: Abdomen is nondistended, soft and mild epigastric tenderness present, no rebound tenderness. Normal bowel sounds heard. Extremities: No cyanosis, clubbing, edema   Data Reviewed: I have personally reviewed following labs and imaging studies  CBC: Recent Labs  Lab 03/08/17 1119 03/09/17 0359 03/10/17 0629 03/11/17 0458 03/12/17 0434  WBC 7.8 8.1 8.5 7.4 7.2  HGB 10.6* 9.2* 10.4* 9.4* 9.3*  HCT 32.1* 28.0* 31.7* 29.0* 29.0*  MCV 87.0 87.0 86.4 87.6 86.1  PLT 266 263 316 255 026   Basic Metabolic Panel: Recent Labs  Lab 03/08/17 1119 03/09/17 0359 03/12/17 0434  NA 136 137 133*  K 3.7 3.8 3.3*  CL 102 105 104  CO2 24 24 22   GLUCOSE 115* 127* 127*  BUN 11 15 11   CREATININE 0.75 0.82 0.64  CALCIUM 8.6* 8.1* 8.0*  MG  --   --  1.7   GFR: Estimated Creatinine Clearance: 43.7 mL/min (by C-G formula based on SCr of 0.64 mg/dL). Liver Function Tests: Recent Labs  Lab 03/09/17 0359 03/12/17 0434  AST 62* 54*  ALT 27 31  ALKPHOS 382* 492*  BILITOT 1.3* 1.8*  PROT 6.1* 6.4*  ALBUMIN 2.2* 2.4*   No results for input(s): LIPASE, AMYLASE in the last 168 hours. No results for input(s): AMMONIA in the last 168 hours. Coagulation Profile: Recent Labs  Lab 03/09/17 0359  INR 1.44   Cardiac Enzymes: Recent Labs  Lab 03/08/17 1338 03/08/17 1920 03/09/17 0008   TROPONINI 0.21* 0.20* 0.20*   BNP (last 3 results) No results for input(s): PROBNP in the last 8760 hours. HbA1C: No results for input(s): HGBA1C in the last 72 hours. CBG: Recent Labs  Lab 03/08/17 1632 03/08/17 2232 03/09/17 0923 03/09/17 1153 03/09/17 1713  GLUCAP 104* 118* 121* 152* 238*   Lipid Profile: No results for input(s): CHOL, HDL, LDLCALC, TRIG, CHOLHDL, LDLDIRECT in the last 72 hours. Thyroid Function Tests: No results for input(s): TSH, T4TOTAL, FREET4, T3FREE, THYROIDAB in the last 72 hours. Anemia Panel: No results for input(s): VITAMINB12, FOLATE, FERRITIN, TIBC, IRON, RETICCTPCT in the last 72 hours. Sepsis Labs: No results for input(s): PROCALCITON, LATICACIDVEN in the last 168 hours.  Recent Results (from the past 240 hour(s))  MRSA PCR Screening     Status: None   Collection Time: 03/09/17  8:44 AM  Result Value Ref Range Status   MRSA by PCR NEGATIVE NEGATIVE Final    Comment:        The GeneXpert MRSA Assay (  FDA approved for NASAL specimens only), is one component of a comprehensive MRSA colonization surveillance program. It is not intended to diagnose MRSA infection nor to guide or monitor treatment for MRSA infections.          Radiology Studies: No results found.      Scheduled Meds: . apixaban  10 mg Oral BID   Followed by  . [START ON 04/07/17] apixaban  5 mg Oral BID  . famotidine  20 mg Oral QHS  . feeding supplement (ENSURE ENLIVE)  237 mL Oral TID BM  . lipase/protease/amylase  12,000 Units Oral TID  . multivitamin with minerals  1 tablet Oral Daily  . oxyCODONE  10 mg Oral Q12H  . pantoprazole  40 mg Oral BID AC  . simethicone  80 mg Oral QID   Continuous Infusions: . sodium chloride 75 mL/hr at 03/11/17 1957     LOS: 4 days        Aline August, MD [facility] Pager [phone]  If 7PM-7AM, please contact night-coverage [email or web address] Password [email or web address] 03/12/2017, 9:08 AM

## 2017-03-12 NOTE — Discharge Summary (Signed)
Physician Discharge Summary  Rhonda Spencer DVV:[id number] DOB: [birth year] DOA: 03/08/2017  PCP: Iona Beard, MD  Admit date: 03/08/2017 Discharge date: 03/12/2017  Admitted From: Home Disposition:  Home with hospice  Recommendations for Outpatient Follow-up:  1. Follow up with PCP in 1week 2. Follow-up with hospice at earliest convenience 3. Follow-up with oncology/Dr. 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 She was found to have PE and extensive liver metastases.  After oncology evaluation, patient and family members have opted for home hospice.  Patient will be discharged today if arrangements can be made for home hospice.   Discharge Diagnoses:  Active Problems:   AI (aortic insufficiency)   Mitral regurgitation   Rheumatic heart disease   HTN (hypertension)   GERD (gastroesophageal reflux disease)   Essential hypertension   Chronic pain   Rheumatoid arthritis (HCC)   Tricuspid regurgitation   Pancreatic cancer (HCC)   Adenocarcinoma of head of pancreas (HCC)   DM type 2 (diabetes mellitus, type 2) (HCC)   Protein-calorie malnutrition, severe   Chest pain   Multiple lung nodules   Pulmonary emboli (HCC)  PE - found to have multipleright sidedPEson CT - Continue Eliquis  Pancreatic cancer with liver mets - s/p Whipples in August 2018 - Liver metastases seem to be new.  -Follow-up from Dr. Burr Medico is appreciated.  Overall prognosis is poor.  Patient and family members apparently have agreed for home hospice.   -I spoke to the patient and family members about CODE STATUS but for now they  want the patient to remain full code. -Palliative care consult is pending  - cont Creon - Patient will be discharged today if arrangements can be made for home hospice.  Epigastric pain - Still having intermittent severe pain.  Continue Protonix and simethicone -Probably secondary to liver metastases. -Continue OxyContin  20 mg every 12 hours and Continue with oxycodone for breakthrough pain  GERD - cont PPI  Underweight Body mass index is 17.54 kg/m.  Severe deconditioning -Probably secondary to above  Anemia -Probably anemia of chronic disease from cancer -Hemoglobin stable  Hypokalemia -Replaced    Discharge Instructions  Discharge Instructions    Diet - low sodium heart healthy   Complete by:  As directed    Increase activity slowly   Complete by:  As directed      Allergies as of 03/12/2017      Reactions   Tarka [trandolapril-verapamil Hcl Er] Swelling   Slowed heart rate Pt reports allergy to any cardiac medication ending in "-il"   Atorvastatin Other (See Comments)   Stated that it lowered her heart rate   Contrast Media [iodinated Diagnostic Agents] Itching      Medication List    STOP taking these medications   oxyCODONE-acetaminophen 5-325 MG tablet Commonly known as:  PERCOCET/ROXICET     TAKE these medications   acetaminophen 325 MG tablet Commonly known as:  TYLENOL Take 2 tablets (650 mg total) by mouth every 4 (four) hours as needed for mild pain, moderate pain, fever or headache. What changed:  reasons to take this   apixaban 5 MG Tabs tablet Commonly known as:  ELIQUIS Take 2 tablets (10 mg  total) by mouth 2 (two) times daily. 10mg  BID till 03/15/17 then 5mg  BID   aspirin EC 81 MG tablet Take 81 mg by mouth daily.   felodipine 5 MG 24 hr tablet Commonly known as:  PLENDIL Take 5 mg by mouth daily.   lipase/protease/amylase 36000 UNITS Cpep capsule Commonly known as:  CREON Take 36,000 Units by mouth 3 (three) times  daily before meals.   nitroGLYCERIN 0.4 MG SL tablet Commonly known as:  NITROSTAT Place 1 tablet (0.4 mg total) under the tongue every 5 (five) minutes as needed for chest pain.   ondansetron 4 MG tablet Commonly known as:  ZOFRAN Take 1 tablet (4 mg total) by mouth every 6 (six) hours as needed for nausea.   oxyCODONE 20 mg 12 hr tablet Commonly known as:  OXYCONTIN Take 1 tablet (20 mg total) by mouth every 12 (twelve) hours.   oxyCODONE 5 MG immediate release tablet Commonly known as:  Oxy IR/ROXICODONE Take 1 tablet (5 mg total) by mouth every 4 (four) hours as needed for moderate pain.   pantoprazole 40 MG tablet Commonly known as:  PROTONIX Take 1 tablet (40 mg total) by mouth 2 (two) times daily before a meal.   polyethylene glycol packet Commonly known as:  MIRALAX / GLYCOLAX Take 17 g by mouth daily as needed for moderate constipation.   senna-docusate 8.6-50 MG tablet Commonly known as:  Senokot-S Take 1 tablet by mouth 2 (two) times daily.   simethicone 80 MG chewable tablet Commonly known as:  MYLICON Chew 1 tablet (80 mg total) by mouth 4 (four) times daily.            Durable Medical Equipment  (From admission, onward)        Start     Ordered   03/12/17 1349  For home use only DME Overbed table  Once     03/12/17 1349   03/12/17 1348  For home use only DME standard manual wheelchair with seat cushion  Once    Comments:  Patient suffers from metastatic pancreatic cancer which impairs their ability to perform daily activities like ambulating in the home.  A rolling walker will not resolve  issue with performing activities of daily living. A wheelchair will allow patient to safely perform daily activities. Patient can safely propel the wheelchair in the home or has a caregiver who can provide assistance.  Accessories: elevating leg rests (ELRs), wheel locks, extensions and anti-tippers.   03/12/17 1349   03/12/17 1346  For home use only DME Hospital  bed  Once    Question Answer Comment  Patient has (list medical condition): pancreatic cancer with liver metastasis   The above medical condition requires: Patient requires the ability to reposition immediately   Head must be elevated greater than: 30 degrees   Bed type Semi-electric   Support Surface: Gel Overlay      03/12/17 1349   03/12/17 1345  For home use only DME Bedside commode  Once    Question:  Patient needs a bedside commode to treat with the following condition  Answer:  Primary pancreatic cancer with metastasis to other site [facility])   03/12/17 1349     Follow-up Information    Iona Beard, MD. Schedule an appointment as soon as possible for a visit in 1 week(s).   Specialty:  Family Medicine Contact information: [address] [place] [address] [phone]        [place] Follow  up.   Specialty:  Hospice and Palliative Medicine Why:  at earliest convenience Contact information: [place] [address] [phone]        Truitt Merle, MD. Schedule an appointment as soon as possible for a visit in 1 week(s).   Specialties:  Hematology, Oncology Contact information: [address] [place] [address] [phone]          Allergies  Allergen Reactions  . Tarka [Trandolapril-Verapamil Hcl Er] Swelling    Slowed heart rate Pt reports allergy to any cardiac medication ending in "-il"  . Atorvastatin Other (See Comments)    Stated that it lowered her heart rate  . Contrast Media [Iodinated Diagnostic Agents] Itching    Consultations:  Oncology   Procedures/Studies: Dg Chest 2 View  Result Date: 03/08/2017 CLINICAL DATA:  Chest pain for the past 3 days. EXAM: CHEST  2 VIEW COMPARISON:  Chest x-ray dated December 17, 2016. FINDINGS: Interval removal of the right PICC catheter. The cardiomediastinal silhouette is normal in size. Normal pulmonary vascularity. No focal consolidation, pleural  effusion, or pneumothorax. No acute osseous abnormality. IMPRESSION: No active cardiopulmonary disease. Electronically Signed   By: Titus Dubin M.D.   On: 03/08/2017 12:00   Ct Chest Wo Contrast  Result Date: 02/25/2017 CLINICAL DATA:  Pancreatic cancer. Procedure August 18th. Question pulmonary nodule on CT thoracic spine EXAM: CT CHEST WITHOUT CONTRAST TECHNIQUE: Multidetector CT imaging of the chest was performed following the standard protocol without IV contrast. COMPARISON:  CT 12/22/2016 FINDINGS: Cardiovascular: Mild calcification of the thoracic aorta. Thoracic aorta is mildly dilated 39 mm. Mediastinum/Nodes: No axillary or supraclavicular adenopathy. No mediastinal hilar adenopathy. No pericardial fluid. Lungs/Pleura: BU nodule in the RIGHT upper lobe described on comparison CT is decreased in size and is now normal linear and 8 mm were previously lesion with two-dimensional at 12 by 11 mm (image 42, series 5. Upper Abdomen: Large hepatic cysts noted. There is heterogeneous attenuation of the liver which appears new from comparison exams post cholecystectomy. Post Whipple procedure. Musculoskeletal: No aggressive osseous lesion per IMPRESSION: 1. Marked decrease in size of RIGHT upper lobe pulmonary nodule consistent with resolving pulmonary infection. 2. New heterogeneous attenuation throughout the liver on noncontrast exam. While this could represent a chemotherapy effect or posttherapy effect, concern for metastatic disease. Recommend hepatic ultrasound with potential if lesion biopsy. Alternatively, a contrast CT or MRI or FDG PET scan could provide specificity. These results will be called to the ordering clinician or representative by the Radiologist Assistant, and communication documented in the PACS or zVision Dashboard. Electronically Signed   By: Suzy Bouchard M.D.   On: 02/25/2017 16:47   Ct Angio Chest Pe W/cm &/or Wo Cm  Result Date: 03/08/2017 CLINICAL DATA:  Abdominal pain  and distension. History of pancreatic cancer post Whipple's procedure in August 2018. EXAM: CT ANGIOGRAPHY CHEST CT ABDOMEN AND PELVIS WITH CONTRAST TECHNIQUE: Multidetector CT imaging of the chest was performed using the standard protocol during bolus administration of intravenous contrast. Multiplanar CT image reconstructions and MIPs were obtained to evaluate the vascular anatomy. Multidetector CT imaging of the abdomen and pelvis was performed using the standard protocol during bolus administration of intravenous contrast. CONTRAST:  127mL ISOVUE-370 IOPAMIDOL (ISOVUE-370) INJECTION 76% COMPARISON:  Body CT 09/24/2016 FINDINGS: CTA CHEST FINDINGS Cardiovascular: Satisfactory opacification of the pulmonary arteries to the segmental level. There are right-sided pulmonary emboli. There are nonocclusive pulmonary emboli within the superior, middle and lower right lobar pulmonary arteries. There  is an occlusive pulmonary embolus within a segmental branch in the posterior basilar right lower lobe pulmonary artery. There is an occlusive pulmonary embolus at the bifurcation of the right medial and lateral middle lobe pulmonary arteries. Mediastinum/Nodes: No enlarged mediastinal, hilar, or axillary lymph nodes. Thyroid gland, trachea, and esophagus demonstrate no significant findings. Lungs/Pleura: Lungs are clear. No pleural effusion or pneumothorax. Musculoskeletal: No chest wall abnormality. No acute or significant osseous findings. Review of the MIP images confirms the above findings. CT ABDOMEN and PELVIS FINDINGS Hepatobiliary: There are innumerable hypoechoic irregular liver masses, replacing most of the liver parenchyma. Previously described liver cysts are stable. There is persistent marked dilation of the extrahepatic common bile duct. Pancreas: Post Whipple's procedure. There is a persistent dilation of the main pancreatic duct in its branches with atrophic remaining pancreatic parenchyma. Spleen: Normal in  size without focal abnormality. Adrenals/Urinary Tract: Elongated cortical areas of hypoattenuation in the right kidney I new from the prior study and may represent areas of hypoperfusion, pyelonephritis or renal masses. 1.6 cm lower pole left renal mass, partially exophytic. Normal adrenal glands. Stomach/Bowel: Post Whipple's procedure. There is no evidence of small-bowel obstruction. Vascular/Lymphatic: Rounded retroperitoneal lymph nodes in the upper abdomen, the largest of which measures 8 mm. Minimal atherosclerotic disease of the aorta. Reproductive: Status post hysterectomy. No adnexal masses. Other: No abdominal wall hernia or abnormality. No abdominopelvic ascites. Musculoskeletal: No definite evidence of osseous metastases. Grade 1 anterolisthesis of L4 on L5, likely due to posterior facet arthropathy. 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 Review of the MIP images confirms the above findings. IMPRESSION: Multiple right-sided pulmonary emboli, including occlusive pulmonary emboli within segmental basilar branch in the right lower lobe pulmonary artery, and the medial and lateral segmental branches of the middle lobe. Nonocclusive pulmonary emboli within the lobar branches of the right upper, middle and lower lobes. Right heart strain with RV/ LV ratio of 1.1. Interval development of extensive hepatic metastatic disease, largely replacing the hepatic parenchyma. Interval development of elongated, somewhat geometric areas of hypoattenuation within the right kidney. These may represent renal masses, areas of pyelonephritis or renal infarcts. Interval development of solid left lower pole renal mass, likely metastatic. Persistent common bile duct and distal pancreatic ductal dilation. Mild upper abdominal retroperitoneal lymphadenopathy. These results were called by telephone at the time of interpretation on 03/08/2017 at 7:24 pm to Dr. Lonie Peak, who verbally acknowledged these results.  Electronically Signed   By: Fidela Salisbury M.D.   On: 03/08/2017 19:29   Ct Abdomen Pelvis W Contrast  Result Date: 03/08/2017 CLINICAL DATA:  Abdominal pain and distension. History of pancreatic cancer post Whipple's procedure in August 2018. EXAM: CT ANGIOGRAPHY CHEST CT ABDOMEN AND PELVIS WITH CONTRAST TECHNIQUE: Multidetector CT imaging of the chest was performed using the standard protocol during bolus administration of intravenous contrast. Multiplanar CT image reconstructions and MIPs were obtained to evaluate the vascular anatomy. Multidetector CT imaging of the abdomen and pelvis was performed using the standard protocol during bolus administration of intravenous contrast. CONTRAST:  11mL ISOVUE-370 IOPAMIDOL (ISOVUE-370) INJECTION 76% COMPARISON:  Body CT 09/24/2016 FINDINGS: CTA CHEST FINDINGS Cardiovascular: Satisfactory opacification of the pulmonary arteries to the segmental level. There are right-sided pulmonary emboli. There are nonocclusive pulmonary emboli within the superior, middle and lower right lobar pulmonary arteries. There is an occlusive pulmonary embolus within a segmental branch in the posterior basilar right lower lobe pulmonary artery. There is an occlusive pulmonary embolus at the bifurcation of the right medial  and lateral middle lobe pulmonary arteries. Mediastinum/Nodes: No enlarged mediastinal, hilar, or axillary lymph nodes. Thyroid gland, trachea, and esophagus demonstrate no significant findings. Lungs/Pleura: Lungs are clear. No pleural effusion or pneumothorax. Musculoskeletal: No chest wall abnormality. No acute or significant osseous findings. Review of the MIP images confirms the above findings. CT ABDOMEN and PELVIS FINDINGS Hepatobiliary: There are innumerable hypoechoic irregular liver masses, replacing most of the liver parenchyma. Previously described liver cysts are stable. There is persistent marked dilation of the extrahepatic common bile duct. Pancreas:  Post Whipple's procedure. There is a persistent dilation of the main pancreatic duct in its branches with atrophic remaining pancreatic parenchyma. Spleen: Normal in size without focal abnormality. Adrenals/Urinary Tract: Elongated cortical areas of hypoattenuation in the right kidney I new from the prior study and may represent areas of hypoperfusion, pyelonephritis or renal masses. 1.6 cm lower pole left renal mass, partially exophytic. Normal adrenal glands. Stomach/Bowel: Post Whipple's procedure. There is no evidence of small-bowel obstruction. Vascular/Lymphatic: Rounded retroperitoneal lymph nodes in the upper abdomen, the largest of which measures 8 mm. Minimal atherosclerotic disease of the aorta. Reproductive: Status post hysterectomy. No adnexal masses. Other: No abdominal wall hernia or abnormality. No abdominopelvic ascites. Musculoskeletal: No definite evidence of osseous metastases. Grade 1 anterolisthesis of L4 on L5, likely due to posterior facet arthropathy. 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 Review of the MIP images confirms the above findings. IMPRESSION: Multiple right-sided pulmonary emboli, including occlusive pulmonary emboli within segmental basilar branch in the right lower lobe pulmonary artery, and the medial and lateral segmental branches of the middle lobe. Nonocclusive pulmonary emboli within the lobar branches of the right upper, middle and lower lobes. Right heart strain with RV/ LV ratio of 1.1. Interval development of extensive hepatic metastatic disease, largely replacing the hepatic parenchyma. Interval development of elongated, somewhat geometric areas of hypoattenuation within the right kidney. These may represent renal masses, areas of pyelonephritis or renal infarcts. Interval development of solid left lower pole renal mass, likely metastatic. Persistent common bile duct and distal pancreatic ductal dilation. Mild upper abdominal retroperitoneal  lymphadenopathy. These results were called by telephone at the time of interpretation on 03/08/2017 at 7:24 pm to Dr. Lonie Peak, who verbally acknowledged these results. Electronically Signed   By: Fidela Salisbury M.D.   On: 03/08/2017 19:29    Echo on 03/09/2017 Study Conclusions  - Left ventricle: The cavity size was normal. Wall thickness was increased in a pattern of mild LVH. Systolic function was vigorous. The estimated ejection fraction was in the range of 65% to 70%. Wall motion was normal; there were no regional wall motion abnormalities. Features are consistent with a pseudonormal left ventricular filling pattern, with concomitant abnormal relaxation and increased filling pressure (grade 2 diastolic dysfunction). Doppler parameters are consistent with high ventricular filling pressure. - Aortic valve: Transvalvular velocity was within the normal range. There was no stenosis. There was moderate regurgitation. - Mitral valve: Thickening. Transvalvular velocity was within the normal range. There was no evidence for stenosis. There was moderate regurgitation. - Right ventricle: The cavity size was normal. Wall thickness was normal. Systolic function was normal. - Atrial septum: No defect or patent foramen ovale was identified. - Tricuspid valve: There was moderate regurgitation. - Pulmonic valve: There was moderate regurgitation. - Pulmonary arteries: Systolic pressure was moderately increased. PA peak pressure: 51 mm Hg (S).     Subjective: Patient seen and examined at bedside. She complains of intermittent severe abdominal pain.  She is  intermittently confused.  No overnight fever or vomiting.  Discharge Exam: Vitals:   03/12/17 1200 03/12/17 1223  BP:  (!) 176/67  Pulse:  100  Resp:  14  Temp: 98.5 F (36.9 C) 98.4 F (36.9 C)  SpO2:  100%   Vitals:   03/12/17 0339 03/12/17 0813 03/12/17 1200 03/12/17 1223  BP: (!) 155/74 (!)  170/72  (!) 176/67  Pulse: 81 85  100  Resp: 10 14  14   Temp: 97.8 F (36.6 C) 97.8 F (36.6 C) 98.5 F (36.9 C) 98.4 F (36.9 C)  TempSrc: Oral Oral Oral Oral  SpO2: 100% 100%  100%  Weight:      Height:        General: Appears in mild distress secondary to abdominal pain. Thinly built elderly female Cardiovascular: S1 & S2 heard, rate controlled  Respiratory: Bilateral decreased breath sounds at bases Abdominal: Abdomen is nondistended, soft and mild epigastric tenderness present, no rebound tenderness. Normal bowel sounds heard. Extremities: no edema, no cyanosis    The results of significant diagnostics from this hospitalization (including imaging, microbiology, ancillary and laboratory) are listed below for reference.     Microbiology: Recent Results (from the past 240 hour(s))  MRSA PCR Screening     Status: None   Collection Time: 03/09/17  8:44 AM  Result Value Ref Range Status   MRSA by PCR NEGATIVE NEGATIVE Final    Comment:        The GeneXpert MRSA Assay (FDA approved for NASAL specimens only), is one component of a comprehensive MRSA colonization surveillance program. It is not intended to diagnose MRSA infection nor to guide or monitor treatment for MRSA infections.      Labs: BNP (last 3 results) No results for input(s): BNP in the last 8760 hours. Basic Metabolic Panel: Recent Labs  Lab 03/08/17 1119 03/09/17 0359 03/12/17 0434  NA 136 137 133*  K 3.7 3.8 3.3*  CL 102 105 104  CO2 24 24 22   GLUCOSE 115* 127* 127*  BUN 11 15 11   CREATININE 0.75 0.82 0.64  CALCIUM 8.6* 8.1* 8.0*  MG  --   --  1.7   Liver Function Tests: Recent Labs  Lab 03/09/17 0359 03/12/17 0434  AST 62* 54*  ALT 27 31  ALKPHOS 382* 492*  BILITOT 1.3* 1.8*  PROT 6.1* 6.4*  ALBUMIN 2.2* 2.4*   No results for input(s): LIPASE, AMYLASE in the last 168 hours. No results for input(s): AMMONIA in the last 168 hours. CBC: Recent Labs  Lab 03/08/17 1119  03/09/17 0359 03/10/17 0629 03/11/17 0458 03/12/17 0434  WBC 7.8 8.1 8.5 7.4 7.2  HGB 10.6* 9.2* 10.4* 9.4* 9.3*  HCT 32.1* 28.0* 31.7* 29.0* 29.0*  MCV 87.0 87.0 86.4 87.6 86.1  PLT 266 263 316 255 228   Cardiac Enzymes: Recent Labs  Lab 03/08/17 1338 03/08/17 1920 03/09/17 0008  TROPONINI 0.21* 0.20* 0.20*   BNP: Invalid input(s): POCBNP CBG: Recent Labs  Lab 03/08/17 1632 03/08/17 2232 03/09/17 0923 03/09/17 1153 03/09/17 1713  GLUCAP 104* 118* 121* 152* 238*   D-Dimer No results for input(s): DDIMER in the last 72 hours. Hgb A1c No results for input(s): HGBA1C in the last 72 hours. Lipid Profile No results for input(s): CHOL, HDL, LDLCALC, TRIG, CHOLHDL, LDLDIRECT in the last 72 hours. Thyroid function studies No results for input(s): TSH, T4TOTAL, T3FREE, THYROIDAB in the last 72 hours.  Invalid input(s): FREET3 Anemia work up No results for input(s): VITAMINB12, FOLATE,  FERRITIN, TIBC, IRON, RETICCTPCT in the last 72 hours. Urinalysis    Component Value Date/Time   COLORURINE YELLOW 03/09/2017 0004   APPEARANCEUR CLEAR 03/09/2017 0004   LABSPEC >1.046 (H) 03/09/2017 0004   PHURINE 6.0 03/09/2017 0004   GLUCOSEU NEGATIVE 03/09/2017 0004   HGBUR NEGATIVE 03/09/2017 0004   BILIRUBINUR NEGATIVE 03/09/2017 0004   KETONESUR 20 (A) 03/09/2017 0004   PROTEINUR NEGATIVE 03/09/2017 0004   UROBILINOGEN 1.0 07/31/2013 0155   NITRITE NEGATIVE 03/09/2017 0004   LEUKOCYTESUR NEGATIVE 03/09/2017 0004   Sepsis Labs Invalid input(s): PROCALCITONIN,  WBC,  LACTICIDVEN Microbiology Recent Results (from the past 240 hour(s))  MRSA PCR Screening     Status: None   Collection Time: 03/09/17  8:44 AM  Result Value Ref Range Status   MRSA by PCR NEGATIVE NEGATIVE Final    Comment:        The GeneXpert MRSA Assay (FDA approved for NASAL specimens only), is one component of a comprehensive MRSA colonization surveillance program. It is not intended to diagnose  MRSA infection nor to guide or monitor treatment for MRSA infections.      Time coordinating discharge: 35 minutes  SIGNED:   Aline August, MD  [facility] 03/12/2017, 2:22 PM Pager: [phone]  If 7PM-7AM, please contact night-coverage [email or web address] Password [email or web address]

## 2017-03-12 NOTE — Progress Notes (Signed)
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 She was found to have PE and extensive liver metastases    PT Comments    Patient progressing well towards PT goals. Improved ambulation distance with Min guard for safety. Continues to demonstrate very slow guarded gait speed and requires cues to increase cadence and pace. Per notes, plans to possibly discharge home with hospice. Eager to mobilize. No dyspnea during mobility. Will continue to follow.   Follow Up Recommendations  Home health PT;Supervision/Assistance - 24 hour     Equipment Recommendations  None recommended by PT    Recommendations for Other Services       Precautions / Restrictions Precautions Precautions: Fall Restrictions Weight Bearing Restrictions: No    Mobility  Bed Mobility               General bed mobility comments: Up in chair upon PT arrival.   Transfers Overall transfer level: Needs assistance Equipment used: Rolling walker (2 wheeled) Transfers: Sit to/from Stand Sit to Stand: Min guard         General transfer comment: min guard for safety and stability, no physical assist required.  Ambulation/Gait Ambulation/Gait assistance: Min guard Ambulation Distance (Feet): 125 Feet Assistive device: Rolling walker (2 wheeled) Gait Pattern/deviations: Step-through pattern;Decreased stride length;Shuffle;Narrow base of support Gait velocity: decreased Gait velocity interpretation: Below normal speed for age/gender General Gait Details: Slow, guarded gait, veering towards right inside RW. Cues for upright and pacing.    Stairs             Wheelchair Mobility    Modified Rankin (Stroke Patients Only)       Balance Overall balance assessment: Needs assistance Sitting-balance support: Feet supported;No upper extremity supported Sitting balance-Leahy Scale: Fair     Standing balance support: During functional activity;Bilateral upper extremity supported Standing balance-Leahy Scale: Poor Standing balance comment: UE support required for dynamic tasks, able to stand statically without UE support.                            Cognition Arousal/Alertness: Awake/alert Behavior During Therapy: WFL for tasks assessed/performed Overall Cognitive Status: Within Functional Limits for tasks assessed                                 General Comments: for basic mobility tasks.      Exercises      General Comments General comments (skin integrity, edema, etc.): Family present during session. VSS throughout.       Pertinent Vitals/Pain Pain Assessment: No/denies pain    Home Living                      Prior Function            PT Goals (current goals can now be found in the care plan section) Progress towards PT goals: Progressing toward goals    Frequency    Min 3X/week      PT Plan Current plan remains appropriate    Co-evaluation  AM-PAC PT "6 Clicks" Daily Activity  Outcome Measure  Difficulty turning over in bed (including adjusting bedclothes, sheets and blankets)?: A Little Difficulty moving from lying on back to sitting on the side of the bed? : A Little Difficulty sitting down on and standing up from a chair with arms (e.g., wheelchair, bedside commode, etc,.)?: None Help needed moving to and from a bed to chair (including a wheelchair)?: A Little Help needed walking in hospital room?: A Little Help needed climbing 3-5 steps with a railing? : A Little 6 Click Score: 19    End of Session Equipment Utilized During Treatment: Gait  belt Activity Tolerance: Patient tolerated treatment well Patient left: in chair;with call bell/phone within reach;with family/visitor present Nurse Communication: Mobility status PT Visit Diagnosis: Unsteadiness on feet (R26.81);Muscle weakness (generalized) (M62.81)     Time: 7011-0034 PT Time Calculation (min) (ACUTE ONLY): 18 min  Charges:  $Gait Training: 8-22 mins                    G Codes:       Wray Kearns, PT, DPT [phone]     [place] 03/12/2017, 11:31 AM

## 2017-03-12 NOTE — Progress Notes (Signed)
This encounter was created in error - please disregard.

## 2017-03-12 NOTE — Care Management (Signed)
ED CM received call from [facility] with [place] concerning patient's equipment, family awaiting hospital bed delivery for discharge home. CM contacted daughter Ivin Booty [phone] regarding the delivery of bed, she reported her daughter was told by [facility] the bed would be delivered tomorrow. [place] [place] Notified.

## 2017-03-12 NOTE — Progress Notes (Addendum)
[facility] Liaison RN visit  Notified by [facility] of patient/family request for [facility] services at home after discharge. Chart and patient information under review by [facility] physician. Hospice eligibility pending at this time.   Spoke with patient, daughter Ivin Booty and son Jeneen Rinks at bedside to initiate education related to hospice philosophy, services and team approach to care. Patient/family verbalized understanding of information given. Per discussion, plan is for discharge home tonight by private vehicle.  Patient requests that discharge pending until DME have arrived at home - could not guarantee DME will be delivered tonight due to pending eligibility and [facility] schedule.   Patient will need prescriptions for discharge comfort medications.  DME needs have been discussed, patient currently has a can in the home.  Patient and family requests the following DME for delivery to the home tonight and requests that equipment be delivery before discharge: Hospital bed, over the bed table, bedside commode, and wheelchair. [facility] equipment manager has been notified and will contact [place] to arrange delivery to the home. Home address has been verified and is correct in the chart. Granddaughter Tilda Burrow is the family member to contact to arrange time of delivery.   [facility] Referral Center aware of above. Completed discharge summary will need to be faxed to [facility] at [phone].  Please notify [facility] when patient is ready to leave the unit at discharge. Call [phone] or [phone] after 5pm.  [facility] information and contact numbers given to family during the visit.  Above information shared with [facility].  Please call with any hospice related questions.  Thank you for this referral,  Gar Ponto, [facility] Liaison [phone]  Update - At 1745 - Patient has been approved by [facility] MD for hospice eligibility  Update - at [place] - Updated RN Arbie Cookey and  Golda Acre on patient status. All DMEs delivered except bed - which [facility] is working on home delivery tonight.    Hospital Liaisons are now on AMION

## 2017-03-12 NOTE — Progress Notes (Signed)
No charge note:   Received page from patient's RN that family is at bedside stating they want to take patient home with Hospice today. Palliative provider is able to meet with family this afternoon around 3pm, however, family has [place] already set.  Informed RN if [place] set and all want to go home with Hospice then primary MD can enter case manager referral for home with Hospice and palliative consult is not necessary. If further assistance is needed, we are welcome to provide, please contact.   Mariana Kaufman, AGNP-C Palliative Medicine  Please call Palliative Medicine team phone with any questions [phone]. For individual providers please see AMION.

## 2017-03-12 NOTE — Care Management Note (Addendum)
Case Management Note  Patient Details  Name: Rhonda Spencer MRN: [id number] Date of Birth: Aug 16, 1940  Subjective/Objective:      Pt admitted on 03/08/17 with chest pain, multiple PE.  Pt with pancreatic CA with liver mets; lives at home with daughter, who provides 24h care.  PCP is Dr. Iona Beard.   Son at bedside; states pt has had [place] services through [facility] in the past, but none presently.    Action/Plan: Recommend PT/OT consults to determine home needs.  Will follow for recommendations.    Expected Discharge Date:                  Expected Discharge Plan:  Home w Hospice Care  In-House Referral:  Hospice / Palliative Care  Discharge planning Services  CM Consult  Post Acute Care Choice:  Durable Medical Equipment, Hospice Choice offered to:  Adult Children, HC POA / Guardian  DME Arranged:  3-N-1, Overbed table, Hospital bed, Wheelchair manual DME Agency:  [place]:  RN, Nurse's Aide, Social Work [facility] Agency:  [facility] [place] of Service:  Completed, signed off  If discussed at [employer], dates discussed:    Additional Comments:  03/12/17 J. Odeth Bry, RN, BSN CM referral for home Hospice.  Met with pt's daughter, Rhonda Spencer ([phone]) to discuss Hospice choice.  Daughter prefers [facility] for home Hospice needs.  Pt has RW and cane at home; discussed needed DME.  Pt will need BSC, hospital bed, overbed table and manual wheelchair for home use.  Referral to [facility]; referral to [facility] for DME needs.  Daughter would like DME to be delivered to home prior to dc.[facility] to follow up with Case Manager regarding delivery time of DME.  Pt may be able to dc today if DME delivered early enough in the evening.  Otherwise, we will need to dc in AM after DME arrives at the home.  [facility] liaison to meet with pt and daughter in room.  Daughter updated; she states she can transport pt home by car.     1430 03/12/17 J. Acelin Ferdig, RN, BSN Per [facility], DME will be delivered to pt's home this evening.  Will plan dc to home once DME in the home.  Daughter updated.    1659 03/12/17 J. Bayler Gehrig, RN, BSN Spoke with pt's daughter; she states [facility] plans to deliver DME between 1-5 on Friday.  RN plans to call MD to let him know pt will need to dc tomorrow due to delay in equipment delivery.  Spoke with Linna Hoff at [facility] to see if DME can be pushed up for delivery tomorrow.  Will follow.    Reinaldo Raddle, RN, BSN  Trauma/Neuro ICU Case Manager [phone]

## 2017-03-13 ENCOUNTER — Other Ambulatory Visit: Payer: Self-pay

## 2017-03-13 LAB — BASIC METABOLIC PANEL
Anion gap: 6 (ref 5–15)
BUN: 10 mg/dL (ref 6–20)
CHLORIDE: 103 mmol/L (ref 101–111)
CO2: 24 mmol/L (ref 22–32)
Calcium: 8.2 mg/dL — ABNORMAL LOW (ref 8.9–10.3)
Creatinine, Ser: 0.64 mg/dL (ref 0.44–1.00)
GFR calc Af Amer: >60 mL/min (ref >60)
GFR calc non Af Amer: >60 mL/min (ref >60)
Glucose, Bld: 166 mg/dL — ABNORMAL HIGH (ref 65–99)
Potassium: 3.8 mmol/L (ref 3.5–5.1)
SODIUM: 133 mmol/L — AB (ref 135–145)

## 2017-03-13 LAB — MAGNESIUM: MAGNESIUM: 1.6 mg/dL — AB (ref 1.7–2.4)

## 2017-03-13 LAB — CANCER ANTIGEN 19-9: CA 19-9: 312796 U/mL — ABNORMAL HIGH (ref 0–35)

## 2017-03-13 NOTE — Progress Notes (Signed)
PT dc'd this am per md orders. Pt was stable leaving the floor, pt left with family via wheelchair to home with hospice. Pt and family was given avs, information was discussed and questions were answered. Iv's were removed and sites were C/D/I.

## 2017-03-13 NOTE — Progress Notes (Signed)
[facility] Liaison: RN visit  Spoke with patient and daughter at bedside. Daughter is taking patient home this morning. The [facility] assessment visit is scheduled for 10:30am.   Daughter states the hospital bed and OBT were delivered last night but she is waiting on W/C and 3N1 to be delivered and she also needs a shower chair.   [facility] equipment manager has been notified and will contact [place] to arrange delivery to the home.    [facility] Referral Center aware of above.   Please call with any hospice related questions.  Thank you for this referral,  Farrel Gordon, RN, [facility] Liaison [place] are now on AMION

## 2017-03-13 NOTE — Progress Notes (Signed)
Patient ID: Rhonda Spencer, female   DOB: [birth year], 76 y.o.   MRN: [id number] Patient was not able to be discharged yesterday.  Arrangements for discharge home today have been made.  She will be discharged home with home hospice.  Please refer to the full discharge summary done by me on 03/12/2017 for full details.

## 2017-03-13 NOTE — Patient Outreach (Signed)
[facility]) Care Management  03/13/17  Rhonda Spencer [birth year] [id number]   Per review of record, patient was discharged home today with hospice care provided by [facility].  Patient presented to the ED on 03/08/2017 with epigastric pain and was found to have PE and extensive liver metastases. Following oncology consult, family and patient opted for home hospice.   Successful outreach completed with patient's daughter, Rhonda Spencer. Patient identification verified. Rhonda Spencer stated that her mother is home and resting. She stated that hospice has already delivered all of their equipment and the nurse came out to the home this morning to do an assessment. She stated that patient is in and out and confused at times. She also stated that she is not eating. She stated that the doctors told them that they believe she is in her final days.  RNCM offered comfort and support to patient's daughter. Discussed that [facility] would be closing case as [facility] would be providing all of her care. However, encouraged her to call if she has any needs or concerns. Rhonda Spencer was appreciative of call and stated she is just listening and supporting her mother during this time.  Plan: RNCM to perform case closure. Letter sent to PCP to advise of case status due to patient discharging home with hospice. Will send to [place] for closure.  Rhonda R. Synethia Endicott, RN, BSN, [place] Management Coordinator [phone]

## 2017-04-07 DEATH — deceased

## 2017-09-12 IMAGING — DX DG ABDOMEN 1V
1 series · 1 of 1 positions shown · non-contrast
Comparison: 11/22/2016.

CLINICAL DATA: Nausea and vomiting. Recent Whipple procedure.
Pancreatic cancer.

EXAM:
ABDOMEN - 1 VIEW

[x abdomen supine]
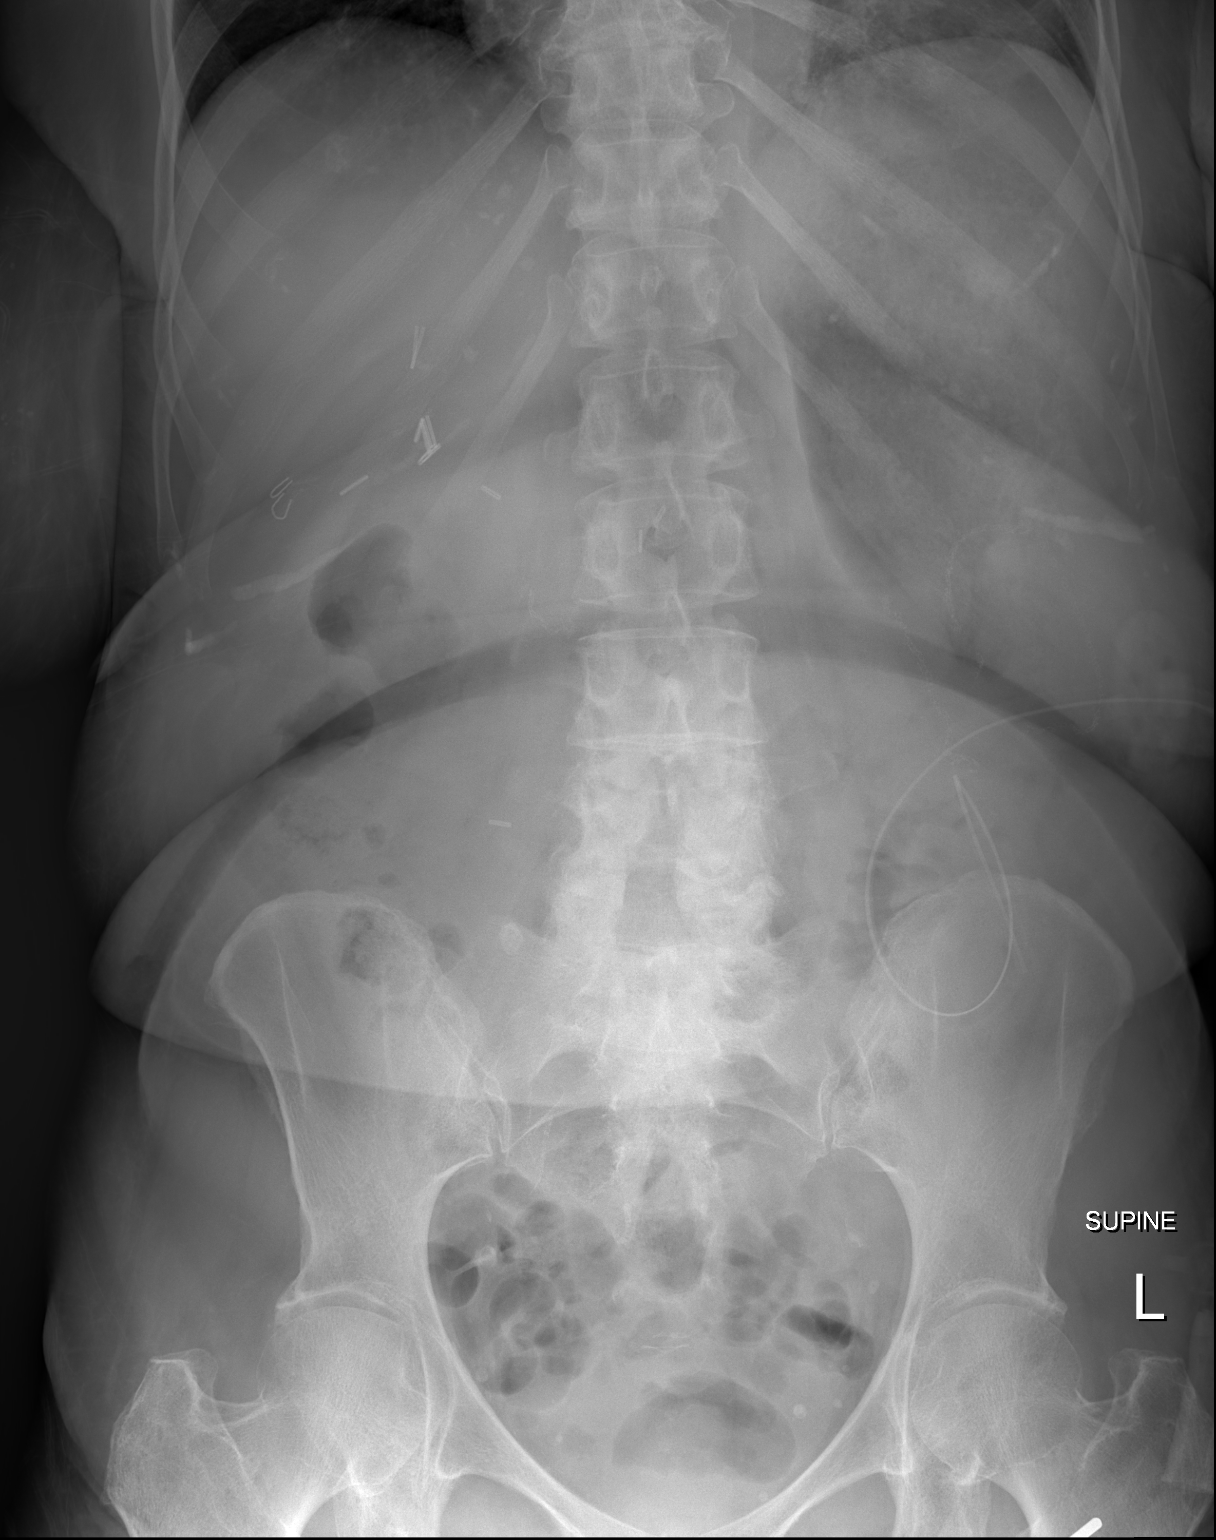

[1 of 1 positions shown; findings below may reference images not displayed]

FINDINGS: Surgical clips. Enteric tube LEFT lower quadrant. Lumbar
spondylosis. No obstruction or visible free air.
IMPRESSION: Negative study.

## 2018-08-11 ENCOUNTER — Other Ambulatory Visit: Payer: Self-pay | Admitting: *Deleted
